# Patient Record
Sex: Female | Born: 1985 | Race: Black or African American | Hispanic: No | Smoking: Former smoker
Health system: Southern US, Community
[De-identification: ages and names within clinical notes are randomized; demographics above are authoritative.]

## PROBLEM LIST (undated history)

## (undated) ENCOUNTER — Inpatient Hospital Stay (HOSPITAL_COMMUNITY): Payer: Self-pay

## (undated) ENCOUNTER — Emergency Department (HOSPITAL_COMMUNITY): Admission: EM | Payer: Medicaid Other | Source: Home / Self Care

## (undated) ENCOUNTER — Emergency Department (HOSPITAL_COMMUNITY): Admission: EM | Payer: Self-pay

## (undated) DIAGNOSIS — O24419 Gestational diabetes mellitus in pregnancy, unspecified control: Secondary | ICD-10-CM

## (undated) DIAGNOSIS — G43909 Migraine, unspecified, not intractable, without status migrainosus: Secondary | ICD-10-CM

## (undated) DIAGNOSIS — R87619 Unspecified abnormal cytological findings in specimens from cervix uteri: Secondary | ICD-10-CM

## (undated) DIAGNOSIS — N946 Dysmenorrhea, unspecified: Secondary | ICD-10-CM

## (undated) DIAGNOSIS — T7840XA Allergy, unspecified, initial encounter: Secondary | ICD-10-CM

## (undated) DIAGNOSIS — Z973 Presence of spectacles and contact lenses: Secondary | ICD-10-CM

## (undated) DIAGNOSIS — F419 Anxiety disorder, unspecified: Secondary | ICD-10-CM

## (undated) DIAGNOSIS — IMO0002 Reserved for concepts with insufficient information to code with codable children: Secondary | ICD-10-CM

## (undated) DIAGNOSIS — F32A Depression, unspecified: Secondary | ICD-10-CM

## (undated) DIAGNOSIS — R110 Nausea: Secondary | ICD-10-CM

## (undated) DIAGNOSIS — I10 Essential (primary) hypertension: Secondary | ICD-10-CM

## (undated) DIAGNOSIS — D649 Anemia, unspecified: Secondary | ICD-10-CM

## (undated) DIAGNOSIS — N739 Female pelvic inflammatory disease, unspecified: Secondary | ICD-10-CM

## (undated) DIAGNOSIS — F329 Major depressive disorder, single episode, unspecified: Secondary | ICD-10-CM

## (undated) HISTORY — DX: Presence of spectacles and contact lenses: Z97.3

## (undated) HISTORY — PX: OTHER SURGICAL HISTORY: SHX169

## (undated) HISTORY — DX: Unspecified abnormal cytological findings in specimens from cervix uteri: R87.619

## (undated) HISTORY — DX: Nausea: R11.0

## (undated) HISTORY — DX: Dysmenorrhea, unspecified: N94.6

## (undated) HISTORY — DX: Gestational diabetes mellitus in pregnancy, unspecified control: O24.419

## (undated) HISTORY — DX: Anemia, unspecified: D64.9

## (undated) HISTORY — DX: Female pelvic inflammatory disease, unspecified: N73.9

## (undated) HISTORY — DX: Migraine, unspecified, not intractable, without status migrainosus: G43.909

## (undated) HISTORY — DX: Anxiety disorder, unspecified: F41.9

## (undated) HISTORY — DX: Reserved for concepts with insufficient information to code with codable children: IMO0002

## (undated) HISTORY — DX: Allergy, unspecified, initial encounter: T78.40XA

## (undated) HISTORY — DX: Depression, unspecified: F32.A

## (undated) HISTORY — DX: Major depressive disorder, single episode, unspecified: F32.9

## (undated) HISTORY — PX: WISDOM TOOTH EXTRACTION: SHX21

---

## 2009-04-23 ENCOUNTER — Emergency Department: Payer: Self-pay | Admitting: Emergency Medicine

## 2009-11-13 ENCOUNTER — Emergency Department: Payer: Self-pay | Admitting: Emergency Medicine

## 2009-11-18 ENCOUNTER — Emergency Department: Payer: Self-pay | Admitting: Emergency Medicine

## 2010-03-11 ENCOUNTER — Emergency Department: Payer: Self-pay | Admitting: Emergency Medicine

## 2010-05-27 ENCOUNTER — Emergency Department: Payer: Self-pay | Admitting: Emergency Medicine

## 2010-05-29 ENCOUNTER — Emergency Department: Payer: Self-pay | Admitting: Emergency Medicine

## 2010-06-02 ENCOUNTER — Emergency Department: Payer: Self-pay | Admitting: Emergency Medicine

## 2010-06-03 ENCOUNTER — Emergency Department: Payer: Self-pay | Admitting: Emergency Medicine

## 2010-10-11 ENCOUNTER — Emergency Department (HOSPITAL_COMMUNITY)
Admission: EM | Admit: 2010-10-11 | Discharge: 2010-10-11 | Payer: Self-pay | Source: Home / Self Care | Admitting: Emergency Medicine

## 2011-01-01 ENCOUNTER — Emergency Department (HOSPITAL_COMMUNITY): Payer: Medicaid Other

## 2011-01-01 ENCOUNTER — Emergency Department (HOSPITAL_COMMUNITY)
Admission: EM | Admit: 2011-01-01 | Discharge: 2011-01-01 | Disposition: A | Discharge status: Home / Self Care | Payer: Medicaid Other | Attending: Emergency Medicine | Admitting: Emergency Medicine

## 2011-01-01 DIAGNOSIS — R059 Cough, unspecified: Secondary | ICD-10-CM | POA: Insufficient documentation

## 2011-01-01 DIAGNOSIS — R05 Cough: Secondary | ICD-10-CM | POA: Insufficient documentation

## 2011-01-01 DIAGNOSIS — R0789 Other chest pain: Secondary | ICD-10-CM | POA: Insufficient documentation

## 2011-01-01 DIAGNOSIS — F3289 Other specified depressive episodes: Secondary | ICD-10-CM | POA: Insufficient documentation

## 2011-01-01 DIAGNOSIS — Z79899 Other long term (current) drug therapy: Secondary | ICD-10-CM | POA: Insufficient documentation

## 2011-01-01 DIAGNOSIS — J45909 Unspecified asthma, uncomplicated: Secondary | ICD-10-CM | POA: Insufficient documentation

## 2011-01-01 DIAGNOSIS — F329 Major depressive disorder, single episode, unspecified: Secondary | ICD-10-CM | POA: Insufficient documentation

## 2011-01-01 DIAGNOSIS — R0602 Shortness of breath: Secondary | ICD-10-CM | POA: Insufficient documentation

## 2011-07-21 ENCOUNTER — Emergency Department (HOSPITAL_COMMUNITY)
Admission: EM | Admit: 2011-07-21 | Discharge: 2011-07-21 | Disposition: A | Discharge status: Home / Self Care | Payer: Medicaid Other | Attending: Emergency Medicine | Admitting: Emergency Medicine

## 2011-07-21 DIAGNOSIS — F3289 Other specified depressive episodes: Secondary | ICD-10-CM | POA: Insufficient documentation

## 2011-07-21 DIAGNOSIS — N949 Unspecified condition associated with female genital organs and menstrual cycle: Secondary | ICD-10-CM | POA: Insufficient documentation

## 2011-07-21 DIAGNOSIS — N94819 Vulvodynia, unspecified: Secondary | ICD-10-CM | POA: Insufficient documentation

## 2011-07-21 DIAGNOSIS — F329 Major depressive disorder, single episode, unspecified: Secondary | ICD-10-CM | POA: Insufficient documentation

## 2011-07-21 DIAGNOSIS — N898 Other specified noninflammatory disorders of vagina: Secondary | ICD-10-CM | POA: Insufficient documentation

## 2011-07-21 LAB — URINE MICROSCOPIC-ADD ON

## 2011-07-21 LAB — URINALYSIS, ROUTINE W REFLEX MICROSCOPIC
Glucose, UA: NEGATIVE mg/dL
Ketones, ur: 40 mg/dL — AB
Leukocytes, UA: NEGATIVE
Nitrite: NEGATIVE
Protein, ur: NEGATIVE mg/dL
Specific Gravity, Urine: 1.028 (ref 1.005–1.030)
Urobilinogen, UA: 1 mg/dL (ref 0.0–1.0)
pH: 5.5 (ref 5.0–8.0)

## 2011-07-21 LAB — WET PREP, GENITAL
Trich, Wet Prep: NONE SEEN
Yeast Wet Prep HPF POC: NONE SEEN

## 2011-07-21 LAB — POCT PREGNANCY, URINE: Preg Test, Ur: NEGATIVE

## 2011-07-22 LAB — GC/CHLAMYDIA PROBE AMP, GENITAL
Chlamydia, DNA Probe: NEGATIVE
GC Probe Amp, Genital: NEGATIVE

## 2011-08-22 ENCOUNTER — Emergency Department (HOSPITAL_COMMUNITY)
Admission: EM | Admit: 2011-08-22 | Discharge: 2011-08-22 | Disposition: A | Discharge status: Home / Self Care | Payer: Self-pay | Attending: Emergency Medicine | Admitting: Emergency Medicine

## 2011-08-22 ENCOUNTER — Encounter: Payer: Self-pay | Admitting: Emergency Medicine

## 2011-08-22 DIAGNOSIS — L0291 Cutaneous abscess, unspecified: Secondary | ICD-10-CM

## 2011-08-22 DIAGNOSIS — IMO0002 Reserved for concepts with insufficient information to code with codable children: Secondary | ICD-10-CM | POA: Insufficient documentation

## 2011-08-22 DIAGNOSIS — J45909 Unspecified asthma, uncomplicated: Secondary | ICD-10-CM | POA: Insufficient documentation

## 2011-08-22 MED ORDER — DOXYCYCLINE HYCLATE 50 MG PO CAPS: 50.0000 mg | ORAL_CAPSULE | Freq: Two times a day (BID) | ORAL | Status: AC

## 2011-08-22 NOTE — ED Provider Notes (Signed)
Medical screening examination/treatment/procedure(s) were performed by non-physician practitioner and as supervising physician I was immediately available for consultation/collaboration.   Gwyneth Sprout, MD 08/22/11 2139

## 2011-08-22 NOTE — ED Provider Notes (Signed)
History     CSN: [id number] Arrival date & time: 08/22/2011  5:43 PM   None     Chief Complaint  Patient presents with  . Recurrent Skin Infections    (Consider location/radiation/quality/duration/timing/severity/associated sxs/prior treatment) The history is provided by the patient.    Pt presents to the ED with complaints of recurrent abscesses. She had an abscess drained by Dermatology a couple of weeks ago, she thought it had healed up but it became a white head then opened when she was in the shower and has been draining since yesterday. Pt has been feeling like she has a fever, however, she does not have one in the ED.   Past Medical History  Diagnosis Date  . Asthma     Past Surgical History  Procedure Date  . Cesarean section     History reviewed. No pertinent family history.  History  Substance Use Topics  . Smoking status: Never Smoker   . Smokeless tobacco: Never Used  . Alcohol Use: No    OB History    Grav Para Term Preterm Abortions TAB SAB Ect Mult Living                  Review of Systems  All other systems reviewed and are negative.    Allergies  Review of patient's allergies indicates no known allergies.  Home Medications   Current Outpatient Rx  Name Route Sig Dispense Refill  . OVER THE COUNTER MEDICATION Oral Take 2 tablets by mouth. Took an over the counter cold medicine, unknown name .       BP 111/69  Pulse 107  Temp(Src) 98.6 F (37 C) (Oral)  Resp 20  SpO2 99%  LMP 08/15/2011  Physical Exam  Constitutional: She appears well-developed and well-nourished.  HENT:  Head: Normocephalic and atraumatic.  Eyes: Conjunctivae are normal. Pupils are equal, round, and reactive to light.  Neck: Trachea normal, normal range of motion and full passive range of motion without pain. Neck supple.  Cardiovascular: Normal rate, regular rhythm and normal pulses.   Pulmonary/Chest: Effort normal and breath sounds normal. Chest wall is not  dull to percussion. She exhibits no tenderness, no crepitus, no edema, no deformity and no retraction.  Abdominal: Soft. Normal appearance and bowel sounds are normal.  Musculoskeletal: Normal range of motion.  Neurological: She is alert. She has normal strength.  Skin: Skin is warm, dry and intact.     Psychiatric: She has a normal mood and affect. Her speech is normal and behavior is normal. Judgment and thought content normal. Cognition and memory are normal.    ED Course  Procedures (including critical care time)  Labs Reviewed - No data to display No results found.   No diagnosis found.    MDM  Pt is developing some mild surrounding cellulitis to open area. Last finished abx on November 11. Will give another short course of doxy and have patient follow-up with her dermatologist.         Dorthula Matas, PA 08/22/11 [phone]

## 2011-08-22 NOTE — ED Notes (Signed)
Pt reports flu like symptoms onset yesterday. Pt also reports boil under right arm draining.

## 2012-02-06 ENCOUNTER — Encounter (HOSPITAL_COMMUNITY): Payer: Self-pay | Admitting: Emergency Medicine

## 2012-02-06 ENCOUNTER — Emergency Department (HOSPITAL_COMMUNITY)
Admission: EM | Admit: 2012-02-06 | Discharge: 2012-02-06 | Disposition: A | Discharge status: Home / Self Care | Payer: Self-pay | Attending: Emergency Medicine | Admitting: Emergency Medicine

## 2012-02-06 DIAGNOSIS — R059 Cough, unspecified: Secondary | ICD-10-CM | POA: Insufficient documentation

## 2012-02-06 DIAGNOSIS — J069 Acute upper respiratory infection, unspecified: Secondary | ICD-10-CM

## 2012-02-06 DIAGNOSIS — R079 Chest pain, unspecified: Secondary | ICD-10-CM | POA: Insufficient documentation

## 2012-02-06 DIAGNOSIS — J45909 Unspecified asthma, uncomplicated: Secondary | ICD-10-CM | POA: Insufficient documentation

## 2012-02-06 DIAGNOSIS — R0602 Shortness of breath: Secondary | ICD-10-CM | POA: Insufficient documentation

## 2012-02-06 DIAGNOSIS — R05 Cough: Secondary | ICD-10-CM | POA: Insufficient documentation

## 2012-02-06 MED ORDER — LORATADINE 10 MG PO TABS
10.0000 mg | ORAL_TABLET | Freq: Every day | ORAL | Status: DC
  Administered 2012-02-06: 10 mg via ORAL
  Filled 2012-02-06: qty 1

## 2012-02-06 MED ORDER — LORATADINE 10 MG PO TABS: 10.0000 mg | ORAL_TABLET | Freq: Every day | ORAL | Status: DC

## 2012-02-06 MED ORDER — PSEUDOEPHEDRINE HCL ER 120 MG PO TB12: 120.0000 mg | ORAL_TABLET | Freq: Two times a day (BID) | ORAL | Status: DC

## 2012-02-06 NOTE — ED Provider Notes (Signed)
History    This chart was scribed for Gerhard Munch, MD, MD by Smitty Pluck. The patient was seen in room STRE3 and the patient's care was started at 2:20PM.   CSN: [id number]  Arrival date & time 02/06/12  1346   First MD Initiated Contact with Patient 02/06/12 1413      Chief Complaint  Patient presents with  . Chest Pain  . Shortness of Breath  . Cough  . Nasal Congestion    (Consider location/radiation/quality/duration/timing/severity/associated sxs/prior treatment) Patient is a 26 y.o. female presenting with chest pain, shortness of breath, and cough. The history is provided by the patient.  Chest Pain Primary symptoms include shortness of breath and cough. Pertinent negatives for primary symptoms include no vomiting.    Shortness of Breath  Associated symptoms include chest pain, cough and shortness of breath.  Cough Associated symptoms include chest pain and shortness of breath.   Vanessa Foster is a 26 y.o. female who presents to the Emergency Department complaining of moderate chest pain 1 day ago and congestion onset 2 weeks ago. Pt reports that she was awaken by pain. She denies any other health complications. She has hx of asthma and has been hospitalized for it in 2001. Pt reports chills. Denies fever, vomiting and diarrhea. Pt uses birth control. Symptoms have been constant since onset without radiation.   Past Medical History  Diagnosis Date  . Asthma     Past Surgical History  Procedure Date  . Cesarean section     History reviewed. No pertinent family history.  History  Substance Use Topics  . Smoking status: Never Smoker   . Smokeless tobacco: Never Used  . Alcohol Use: No    OB History    Grav Para Term Preterm Abortions TAB SAB Ect Mult Living                  Review of Systems  Constitutional:       HPI  HENT:       HPI otherwise negative  Eyes: Negative.   Respiratory: Positive for cough and shortness of breath.        HPI,  otherwise negative  Cardiovascular: Positive for chest pain.       HPI, otherwise nmegative  Gastrointestinal: Negative for vomiting.  Genitourinary:       HPI, otherwise negative  Musculoskeletal:       HPI, otherwise negative  Skin: Negative.   Neurological: Negative for syncope.    Allergies  Review of patient's allergies indicates no known allergies.  Home Medications  No current outpatient prescriptions on file.  BP 116/82  Pulse 89  Resp 20  SpO2 100%  Physical Exam  Nursing note and vitals reviewed. Constitutional: She is oriented to person, place, and time. She appears well-developed and well-nourished. No distress.  HENT:  Head: Normocephalic and atraumatic.  Mouth/Throat: Oropharynx is clear and moist.  Eyes: EOM are normal.  Neck: Neck supple. No tracheal deviation present.  Cardiovascular: Normal rate.   Pulmonary/Chest: Effort normal. No respiratory distress.  Musculoskeletal: Normal range of motion.  Lymphadenopathy:    She has no cervical adenopathy.  Neurological: She is alert and oriented to person, place, and time.  Skin: Skin is warm and dry.  Psychiatric: She has a normal mood and affect. Her behavior is normal.    ED Course  Procedures (including critical care time) DIAGNOSTIC STUDIES: Oxygen Saturation is 100% on room air, normal by my interpretation.    COORDINATION OF  CARE: 2:25PM EDP discusses pt ED treatment with pt.    Labs Reviewed - No data to display No results found.   No diagnosis found.   Date: 02/06/2012  Rate: 89  Rhythm: normal sinus rhythm and sinus arrhythmia  QRS Axis: normal  Intervals: normal  ST/T Wave abnormalities: normal  Conduction Disutrbances:none  Narrative Interpretation:   Old EKG Reviewed: none available BORDERLINE ECG   MDM  I personally performed the services described in this documentation, which was scribed in my presence. The recorded information has been reviewed and considered.   This  generally well female presents with cough and congestion for several weeks and new chest pain.  On exam the patient is in no distress with no audible wheezing.  Given the patient's description of a history of asthma, as well as her rhinitis her symptoms are likely due to a multifactorial process, with concern for URI versus seasonal allergies versus asthma.  The patient remained no distress throughout the emergency department evaluation.  She was not hypoxic, tachypneic or tachycardic.  The patient received medication as well as counseling appropriate therapy and was discharged in stable condition.   Gerhard Munch, MD 02/06/12 [phone]

## 2012-02-06 NOTE — ED Notes (Signed)
Pt reports cough and nasal congestion x 2 weeks, chest pain with shortness of breath onset yesterday.

## 2012-06-15 ENCOUNTER — Encounter (HOSPITAL_COMMUNITY): Payer: Self-pay

## 2012-06-15 ENCOUNTER — Emergency Department (INDEPENDENT_AMBULATORY_CARE_PROVIDER_SITE_OTHER)
Admission: EM | Admit: 2012-06-15 | Discharge: 2012-06-15 | Disposition: A | Discharge status: Home / Self Care | Payer: Self-pay | Source: Home / Self Care

## 2012-06-15 DIAGNOSIS — L02419 Cutaneous abscess of limb, unspecified: Secondary | ICD-10-CM

## 2012-06-15 DIAGNOSIS — IMO0002 Reserved for concepts with insufficient information to code with codable children: Secondary | ICD-10-CM

## 2012-06-15 MED ORDER — CEPHALEXIN 500 MG PO CAPS: 500.0000 mg | ORAL_CAPSULE | Freq: Three times a day (TID) | ORAL | Status: DC

## 2012-06-15 NOTE — ED Provider Notes (Signed)
History     CSN: [id number]  Arrival date & time 06/15/12  1445   None     Chief Complaint  Patient presents with  . Sore    (Consider location/radiation/quality/duration/timing/severity/associated sxs/prior treatment) HPI  CC: Abscess  Abscess: boil started 4 weeks ago under R armpit. Popped 3 weeks ago. Has become progressively more painful w/ bloddy purulent discharge. Tried triple antibiotic ointment at hoe w/o relief. Has had this before and was seen by dermatologist for questionable hydradenitis. Reports elevated temp to 100.2, chills, ha and occasional nausea. Denies any rash, emesis, diarrhea, loc. Motrin w/o benefit.    Past Medical History  Diagnosis Date  . Asthma     Past Surgical History  Procedure Date  . Cesarean section     No family history on file.  History  Substance Use Topics  . Smoking status: Never Smoker   . Smokeless tobacco: Never Used  . Alcohol Use: No    OB History    Grav Para Term Preterm Abortions TAB SAB Ect Mult Living                  Review of Systems Per hpi Allergies  Review of patient's allergies indicates no known allergies.  Home Medications   Current Outpatient Rx  Name Route Sig Dispense Refill  . CEPHALEXIN 500 MG PO CAPS Oral Take 1 capsule (500 mg total) by mouth 3 (three) times daily. 21 capsule 0  . LORATADINE 10 MG PO TABS Oral Take 1 tablet (10 mg total) by mouth daily. 10 tablet 0  . PSEUDOEPHEDRINE HCL ER 120 MG PO TB12 Oral Take 1 tablet (120 mg total) by mouth every 12 (twelve) hours. 10 tablet 0    BP 121/86  Pulse 77  Temp 98.2 F (36.8 C) (Oral)  Resp 16  SpO2 100%  Physical Exam  Gen: WNWD, NAD SKin: L axillary boil w/ area of induration about 1x2cm. Erythema Res: normal effort  ED Course  INCISION AND DRAINAGE Date/Time: 06/15/2012 4:36 PM Performed by: Konrad Dolores, DAVID J Authorized by: Jimmie Molly Consent: Verbal consent obtained. Risks and benefits: risks, benefits and  alternatives were discussed Consent given by: patient Patient understanding: patient states understanding of the procedure being performed Patient consent: the patient's understanding of the procedure matches consent given Relevant documents: relevant documents present and verified Test results: test results available and properly labeled Site marked: the operative site was marked Patient identity confirmed: verbally with patient Time out: Immediately prior to procedure a "time out" was called to verify the correct patient, procedure, equipment, support staff and site/side marked as required. Type: abscess Body area: trunk Location details: chest Local anesthetic: lidocaine 2% without epinephrine Anesthetic total: 2 ml Scalpel size: 11 Needle gauge: 30. Incision type: single straight Complexity: simple Drainage: purulent Drainage amount: scant Wound treatment: wound left open Packing material: 1/4 in gauze Patient tolerance: Patient tolerated the procedure well with no immediate complications.   (including critical care time)  Labs Reviewed - No data to display No results found.   1. Axillary abscess       MDM  26yo AAF w/ recurrent axillary infections w/ abscess. S/p I&D and ready for DC - Keflex 7 days - pt w/ careful wound care instructions. To remove gauze in 24 hrs - f/u w/ pcp/derm        Ozella Rocks, MD 06/15/12 1640

## 2012-06-15 NOTE — ED Notes (Signed)
Patient states she has an abscess under her right arm, says that it developed over 3 weeks ago

## 2012-06-15 NOTE — ED Provider Notes (Signed)
Medical screening examination/treatment/procedure(s) were performed by non-physician practitioner and as supervising physician I was immediately available for consultation/collaboration.  Raynald Blend, MD 06/15/12 [phone]

## 2012-06-19 LAB — CULTURE, ROUTINE-ABSCESS: Culture: NO GROWTH

## 2012-07-03 ENCOUNTER — Emergency Department (HOSPITAL_COMMUNITY)
Admission: EM | Admit: 2012-07-03 | Discharge: 2012-07-03 | Disposition: A | Discharge status: Home / Self Care | Payer: Self-pay | Attending: Emergency Medicine | Admitting: Emergency Medicine

## 2012-07-03 ENCOUNTER — Encounter (HOSPITAL_COMMUNITY): Payer: Self-pay | Admitting: *Deleted

## 2012-07-03 DIAGNOSIS — IMO0002 Reserved for concepts with insufficient information to code with codable children: Secondary | ICD-10-CM | POA: Insufficient documentation

## 2012-07-03 DIAGNOSIS — L0291 Cutaneous abscess, unspecified: Secondary | ICD-10-CM

## 2012-07-03 DIAGNOSIS — J45909 Unspecified asthma, uncomplicated: Secondary | ICD-10-CM | POA: Insufficient documentation

## 2012-07-03 MED ORDER — ONDANSETRON HCL 4 MG PO TABS: 4.0000 mg | ORAL_TABLET | Freq: Four times a day (QID) | ORAL | Status: DC

## 2012-07-03 MED ORDER — CEPHALEXIN 500 MG PO CAPS: 500.0000 mg | ORAL_CAPSULE | Freq: Four times a day (QID) | ORAL | Status: DC

## 2012-07-03 MED ORDER — CLOTRIMAZOLE 1 % EX CREA: TOPICAL_CREAM | CUTANEOUS | Status: DC

## 2012-07-03 NOTE — ED Provider Notes (Signed)
History     CSN: [id number]  Arrival date & time 07/03/12  1543   First MD Initiated Contact with Patient 07/03/12 1613      Chief Complaint  Patient presents with  . Abscess    left arm    (Consider location/radiation/quality/duration/timing/severity/associated sxs/prior treatment) HPI Comments: This 26 year old female, who presents to the emergency department with chief complaint of abscess under her left axilla. Additionally she states that she is concerned about getting a stomach bug and feels nauseated. She also complains of a new rash on her breasts. She has not tried anything to alleviate her symptoms. She is in moderate discomfort. Her pain does not radiate.  The history is provided by the patient. No language interpreter was used.    Past Medical History  Diagnosis Date  . Asthma     Past Surgical History  Procedure Date  . Cesarean section     No family history on file.  History  Substance Use Topics  . Smoking status: Never Smoker   . Smokeless tobacco: Never Used  . Alcohol Use: No    OB History    Grav Para Term Preterm Abortions TAB SAB Ect Mult Living                  Review of Systems  Constitutional: Negative for fever.  HENT: Negative for rhinorrhea.   Respiratory: Negative for cough, chest tightness and shortness of breath.   Cardiovascular: Negative for chest pain and palpitations.  Gastrointestinal: Positive for nausea. Negative for abdominal pain.  Genitourinary: Negative for dysuria.  Musculoskeletal: Negative for back pain.  Skin:       Rash in between breasts  Neurological: Positive for headaches. Negative for weakness.  Psychiatric/Behavioral: The patient is not nervous/anxious.   All other systems reviewed and are negative.    Allergies  Review of patient's allergies indicates no known allergies.  Home Medications   Current Outpatient Rx  Name Route Sig Dispense Refill  . ACETAMINOPHEN 500 MG PO TABS Oral Take 1,000 mg  by mouth every 6 (six) hours as needed. For pain    . CEPHALEXIN 500 MG PO CAPS Oral Take 1 capsule (500 mg total) by mouth 4 (four) times daily. 40 capsule 0  . ONDANSETRON HCL 4 MG PO TABS Oral Take 1 tablet (4 mg total) by mouth every 6 (six) hours. 12 tablet 0    BP 137/100  Pulse 98  Temp 98.4 F (36.9 C) (Oral)  Resp 18  SpO2 99%  Physical Exam  Nursing note and vitals reviewed. Constitutional: She is oriented to person, place, and time. She appears well-developed and well-nourished.  HENT:  Head: Normocephalic and atraumatic.  Eyes: Conjunctivae normal and EOM are normal. Pupils are equal, round, and reactive to light.  Neck: Normal range of motion. Neck supple.  Cardiovascular: Normal rate, regular rhythm and normal heart sounds.   Pulmonary/Chest: Effort normal and breath sounds normal.  Abdominal: Soft. Bowel sounds are normal.  Musculoskeletal: Normal range of motion.  Neurological: She is alert and oriented to person, place, and time.  Skin: Skin is warm. Rash noted.       Small 1 cm abscess under left axilla, 2 cm diameter rash on medial right breast, appears fungal.  Psychiatric: She has a normal mood and affect. Her behavior is normal. Judgment and thought content normal.    ED Course  Procedures (including critical care time)  Labs Reviewed - No data to display No results found.  INCISION AND DRAINAGE Performed by: Roxy Horseman Consent: Verbal consent obtained. Risks and benefits: risks, benefits and alternatives were discussed Type: abscess  Body area: left axilla  Anesthesia: local infiltration  Local anesthetic: lidocaine 2% without epinephrine  Anesthetic total: 2 ml  Complexity: complex Blunt dissection to break up loculations  Drainage: purulent  Drainage amount: minimal  Packing material: 1/4 in iodoform gauze  Patient tolerance: Patient tolerated the procedure well with no immediate complications.   1. Abscess       MDM    26 year old female with left axillary abscess. I attempted to I&D the abscess, but was unsuccessful at getting any drainage out. Will give the patient Keflex for one week.  Patient also has what appears to be fungal infection on her breast. Will prescribe clotrimazole cream and instructed patient to follow up with her primary care provider.  Patient also states that she is feeling like she is getting sick. She states that she is nauseated and has a headache. I will prescribe Zofran and recommend OTC pain medicine for her symptoms.  The patient is stable and ready for discharge. Return precautions have been given.       Roxy Horseman, PA-C 07/03/12 1728

## 2012-07-03 NOTE — ED Notes (Signed)
Pt stated that she thinks that the spot on her right breast looks like something a pt that she has at her work has. The pt at her work is on isolation and she thinks they are on isolation for shingles.

## 2012-07-03 NOTE — ED Notes (Signed)
Abscess under left arm and she has a dark spot to right breast area.  Pt reports headache when she got off of work this am.  Pt does not get migraines.  Pt has left posterior headache.  Pt reports nausea.  Not sensitive to light or sound.  PT crying at triage

## 2012-07-04 NOTE — ED Provider Notes (Signed)
Medical screening examination/treatment/procedure(s) were performed by non-physician practitioner and as supervising physician I was immediately available for consultation/collaboration. Zimal Weisensel, MD, FACEP   Arelia Volpe L Emmery Seiler, MD 07/04/12 0025 

## 2013-01-15 ENCOUNTER — Emergency Department (HOSPITAL_COMMUNITY)
Admission: EM | Admit: 2013-01-15 | Discharge: 2013-01-15 | Disposition: A | Discharge status: Home / Self Care | Payer: Managed Care, Other (non HMO) | Attending: Emergency Medicine | Admitting: Emergency Medicine

## 2013-01-15 ENCOUNTER — Encounter (HOSPITAL_COMMUNITY): Payer: Self-pay | Admitting: Emergency Medicine

## 2013-01-15 DIAGNOSIS — J45909 Unspecified asthma, uncomplicated: Secondary | ICD-10-CM | POA: Insufficient documentation

## 2013-01-15 DIAGNOSIS — IMO0002 Reserved for concepts with insufficient information to code with codable children: Secondary | ICD-10-CM | POA: Insufficient documentation

## 2013-01-15 DIAGNOSIS — M5416 Radiculopathy, lumbar region: Secondary | ICD-10-CM

## 2013-01-15 MED ORDER — PREDNISONE 50 MG PO TABS: 50.0000 mg | ORAL_TABLET | Freq: Every day | ORAL | Status: DC

## 2013-01-15 MED ORDER — HYDROCODONE-ACETAMINOPHEN 5-325 MG PO TABS: 1.0000 | ORAL_TABLET | Freq: Four times a day (QID) | ORAL | Status: DC | PRN

## 2013-01-15 NOTE — ED Provider Notes (Signed)
History     CSN: [id number]  Arrival date & time 01/15/13  1025   First MD Initiated Contact with Patient 01/15/13 1057      Chief Complaint  Patient presents with  . Leg Pain    (Consider location/radiation/quality/duration/timing/severity/associated sxs/prior treatment) Patient is a 27 y.o. female presenting with leg pain.  Leg Pain  Patient is 27 y.o female who presents with left sided leg pain.  Patient states pain began yesterday after standing and then sitting for an extended period of time.  She denies any other precipitating event or injury.  Pain is described as a shooting/throbbing sharp pain that begins at her lower back and goes down her left leg.  Pain is made worse by bending over and standing.  She has never experienced this before.  She has not taken any medication for the pain.  She denies neck, knee, and ankle pain.    Past Medical History  Diagnosis Date  . Asthma     Past Surgical History  Procedure Laterality Date  . Cesarean section      No family history on file.  History  Substance Use Topics  . Smoking status: Never Smoker   . Smokeless tobacco: Never Used  . Alcohol Use: No    OB History   Grav Para Term Preterm Abortions TAB SAB Ect Mult Living                  Review of Systems All other systems negative except as documented in the HPI. All pertinent positives and negatives as reviewed in the HPI.   Allergies  Review of patient's allergies indicates no known allergies.  Home Medications  No current outpatient prescriptions on file.  BP 108/77  Pulse 76  Temp(Src) 98.1 F (36.7 C) (Oral)  Resp 16  SpO2 98%  Physical Exam  Nursing note and vitals reviewed. Constitutional: She is oriented to person, place, and time. She appears well-developed and well-nourished.  HENT:  Head: Normocephalic and atraumatic.  Neck: Normal range of motion. Neck supple.  Cardiovascular: Normal rate and regular rhythm.   Pulmonary/Chest: Effort  normal and breath sounds normal.  Musculoskeletal: Normal range of motion.       Left hip: She exhibits normal range of motion, normal strength and no tenderness.       Left knee: Normal.       Cervical back: She exhibits normal range of motion, no tenderness and no bony tenderness.  Lumbar Back: questionable left-sided straight leg test.  Tenderness over left sciatic notch.  Neurological: She is alert and oriented to person, place, and time. She exhibits normal muscle tone. Coordination normal.  Skin: Skin is warm and dry. No rash noted.    ED Course  Procedures (including critical care time)  Return here as needed. 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 The patient states that she did not fall or injury herself that she knows of. Told to ice and heat to her back. Patient has normal reflexes and strength in her lower extremities.  MDM          Carlyle Dolly, PA-C 01/15/13 1203

## 2013-01-15 NOTE — ED Provider Notes (Signed)
Medical screening examination/treatment/procedure(s) were performed by non-physician practitioner and as supervising physician I was immediately available for consultation/collaboration.  Isabella Roemmich R. Gali Spinney, MD 01/17/13 0019 

## 2013-01-15 NOTE — ED Notes (Signed)
Pt. Stated, ive had both legs to hurt since yesterday.  No injury

## 2013-04-25 ENCOUNTER — Emergency Department (HOSPITAL_COMMUNITY)
Admission: EM | Admit: 2013-04-25 | Discharge: 2013-04-25 | Disposition: A | Discharge status: Home / Self Care | Payer: Managed Care, Other (non HMO) | Attending: Emergency Medicine | Admitting: Emergency Medicine

## 2013-04-25 ENCOUNTER — Encounter (HOSPITAL_COMMUNITY): Payer: Self-pay | Admitting: Emergency Medicine

## 2013-04-25 DIAGNOSIS — IMO0002 Reserved for concepts with insufficient information to code with codable children: Secondary | ICD-10-CM | POA: Insufficient documentation

## 2013-04-25 DIAGNOSIS — L0291 Cutaneous abscess, unspecified: Secondary | ICD-10-CM

## 2013-04-25 DIAGNOSIS — J45909 Unspecified asthma, uncomplicated: Secondary | ICD-10-CM | POA: Insufficient documentation

## 2013-04-25 NOTE — ED Notes (Signed)
Pt c/o bilateral axillary abscesses. Hx of same. Right axilla has pink tissue protruding from wound. Pt states she gets them every two weeks.

## 2013-04-25 NOTE — ED Notes (Signed)
PA at bedside for evaluation

## 2013-04-25 NOTE — ED Provider Notes (Signed)
CSN: [id number]     Arrival date & time 04/25/13  1307 History     First MD Initiated Contact with Patient 04/25/13 1347     Chief Complaint  Patient presents with  . Abscess   (Consider location/radiation/quality/duration/timing/severity/associated sxs/prior Treatment) Patient is a 27 y.o. female presenting with abscess. The history is provided by the patient. No language interpreter was used.  Abscess Associated symptoms: no fever and no headaches   Vanessa Foster is a 27 y/o F with PMHx of asthma, presenting to the ED with abscess to bilateral axillas. Patient reported that the abscess formed approximately 10 days ago, reported that the right axilla is causing more discomfort than the left - stated that the left abscess has been draining and feels a lot better. Patient reported that she has noticed draining to the right axilla, reported that there was grayish discharge on the gauze when it was removed today. Patient reported that she has been experiencing a pinching sensation to the right axilla that is constant - reported that pain radiates down the left arm. 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 Reported that nothing makes the pain better. Stated that she has been using warm soaks, warm compressions, triple antibiotics. Denied fever, chest pain, shortness of breath, difficulty breathing, neck pain, neck stiffness, numbness, tingling.  PCP Dr. Dareen Piano  Patient reported that she has been having issues with abscesses formation for the past couple of years, since 2010-2011 - patient reported that she gets abscesses all the time mainly to the axilla region.   Past Medical History  Diagnosis Date  . Asthma    Past Surgical History  Procedure Laterality Date  . Cesarean section     No family history on file. History  Substance Use Topics  . Smoking status: Never Smoker   . Smokeless tobacco: Never Used  . Alcohol Use: No   OB History   Grav Para  Term Preterm Abortions TAB SAB Ect Mult Living                 Review of Systems  Constitutional: Negative for fever and chills.  HENT: Negative for neck pain and neck stiffness.   Respiratory: Negative for cough and shortness of breath.   Cardiovascular: Negative for chest pain.  Musculoskeletal: Negative for back pain and arthralgias.  Skin: Positive for wound (abscess).  Neurological: Negative for dizziness, weakness and headaches.  All other systems reviewed and are negative.    Allergies  Review of patient's allergies indicates no known allergies.  Home Medications   Current Outpatient Rx  Name  Route  Sig  Dispense  Refill  . ibuprofen (ADVIL,MOTRIN) 200 MG tablet   Oral   Take 400 mg by mouth every 6 (six) hours as needed for pain or headache.         . neomycin-bacitracin-polymyxin (NEOSPORIN) ointment   Topical   Apply 1 application topically every 12 (twelve) hours. Apply to boil under arm          BP 121/85  Pulse 78  Temp(Src) 98.2 F (36.8 C) (Oral)  SpO2 100%  LMP 04/11/2013 Physical Exam  Nursing note and vitals reviewed. Constitutional: She is oriented to person, place, and time. She appears well-developed and well-nourished. No distress.  HENT:  Head: Normocephalic and atraumatic.  Eyes: Conjunctivae and EOM are normal. Pupils are equal, round, and reactive to light. Right eye exhibits no discharge. Left eye exhibits no discharge.  Neck: Normal  range of motion. Neck supple.  Cardiovascular: Normal rate, regular rhythm and normal heart sounds.  Exam reveals no friction rub.   No murmur heard. Pulses:      Radial pulses are 2+ on the right side, and 2+ on the left side.  Pulmonary/Chest: Effort normal and breath sounds normal. No respiratory distress. She has no wheezes. She has no rales.  Musculoskeletal: Normal range of motion.  Lymphadenopathy:    She has no cervical adenopathy.  Neurological: She is alert and oriented to person, place, and  time. She exhibits normal muscle tone. Coordination normal.  Strength 5+/5+ with resistance Sensation intact  Skin: Skin is warm and dry. She is not diaphoretic.  Mild erythema with minimal drainage from left axilla, small approximately 0.5 cm x 0.5 cm abscess. Mild discomfort upon palpation.   Subcutaneous tissue noted to the right axilla, discomfort upon palpation. Negative induration noted. Negative drainage noted.   Negative streaking bilaterally  Psychiatric: She has a normal mood and affect. Her behavior is normal. Thought content normal.    ED Course   Procedures (including critical care time)  3:15PM Dr. Abran Duke saw and assessed patient. Recommended no antibiotics. As per physician, recommended discharge and close follow-up.   Labs Reviewed - No data to display No results found. 1. Abscess     MDM  Patient presenting to the ED with abscesses to bilateral axilla. Right axilla the most discomfort with grayish drainage today. Patient has history of numerous I&D and abscess formation. Subcutaneous tissue appearing to the right axilla, negative drainage noted. Mild erythema. Pain upon palpation. Negative induration noted. Small approximately 0.5 cm x 0.5 cm abscess with drainage to the left axilla. Dr. Jodi Mourning assessed patient - as per physician for discharge and close follow-up, did not recommend antibiotics. Patient stable, afebrile. Negative streaking to the arms noted - doubt lymphangitis. Possible hidradenitis supprativa. Discharged patient. Discussed with patient proper care of wound. Referred patient to general surgery and PCP. Discussed with patient to continue warm compressions and warm soaks. Discussed with patient to continue to monitor symptoms and if symptoms are to worsen or change to report back to the ED -strict return instructions given. Patient agreed to plan of care, understood, all questions answered.     Raymon Mutton, PA-C 04/25/13 1823

## 2013-04-26 NOTE — ED Provider Notes (Signed)
Medical screening examination/treatment/procedure(s) were conducted as a shared visit with non-physician practitioner(s) or resident  and myself.  I personally evaluated the patient during the encounter and agree with the findings and plan unless otherwise indicated. Recurrent small abscesses.  No significant fluctuance to drain in ED, no crepitus, no cellulitis.  Well appearing.  No fevers.  Fup discussed.  No abx needed at this time.  DC  Enid Skeens, MD 04/26/13 2306

## 2013-06-20 ENCOUNTER — Encounter (HOSPITAL_COMMUNITY): Payer: Self-pay | Admitting: *Deleted

## 2013-06-20 ENCOUNTER — Emergency Department (HOSPITAL_COMMUNITY)
Admission: EM | Admit: 2013-06-20 | Discharge: 2013-06-20 | Disposition: A | Discharge status: Home / Self Care | Payer: Managed Care, Other (non HMO) | Attending: Emergency Medicine | Admitting: Emergency Medicine

## 2013-06-20 DIAGNOSIS — A599 Trichomoniasis, unspecified: Secondary | ICD-10-CM | POA: Insufficient documentation

## 2013-06-20 DIAGNOSIS — N390 Urinary tract infection, site not specified: Secondary | ICD-10-CM

## 2013-06-20 DIAGNOSIS — J45909 Unspecified asthma, uncomplicated: Secondary | ICD-10-CM | POA: Insufficient documentation

## 2013-06-20 DIAGNOSIS — Z3202 Encounter for pregnancy test, result negative: Secondary | ICD-10-CM | POA: Insufficient documentation

## 2013-06-20 LAB — URINALYSIS, ROUTINE W REFLEX MICROSCOPIC
Bilirubin Urine: NEGATIVE
Glucose, UA: NEGATIVE mg/dL
Hgb urine dipstick: NEGATIVE
Ketones, ur: NEGATIVE mg/dL
Nitrite: NEGATIVE
Protein, ur: NEGATIVE mg/dL
Specific Gravity, Urine: 1.012 (ref 1.005–1.030)
Urobilinogen, UA: 0.2 mg/dL (ref 0.0–1.0)
pH: 5.5 (ref 5.0–8.0)

## 2013-06-20 LAB — WET PREP, GENITAL: Yeast Wet Prep HPF POC: NONE SEEN

## 2013-06-20 LAB — URINE MICROSCOPIC-ADD ON

## 2013-06-20 LAB — POCT PREGNANCY, URINE: Preg Test, Ur: NEGATIVE

## 2013-06-20 MED ORDER — METRONIDAZOLE 500 MG PO TABS
2000.0000 mg | ORAL_TABLET | Freq: Once | ORAL | Status: AC
  Administered 2013-06-20: 2000 mg via ORAL
  Filled 2013-06-20: qty 4

## 2013-06-20 MED ORDER — LIDOCAINE HCL (PF) 1 % IJ SOLN
INTRAMUSCULAR | Status: AC
  Administered 2013-06-20: 5 mL
  Filled 2013-06-20: qty 5

## 2013-06-20 MED ORDER — AZITHROMYCIN 250 MG PO TABS
1000.0000 mg | ORAL_TABLET | Freq: Once | ORAL | Status: AC
  Administered 2013-06-20: 1000 mg via ORAL
  Filled 2013-06-20: qty 4

## 2013-06-20 MED ORDER — SULFAMETHOXAZOLE-TRIMETHOPRIM 800-160 MG PO TABS: 1.0000 | ORAL_TABLET | Freq: Two times a day (BID) | ORAL | Status: DC

## 2013-06-20 MED ORDER — CEFTRIAXONE SODIUM 250 MG IJ SOLR
250.0000 mg | Freq: Once | INTRAMUSCULAR | Status: AC
  Administered 2013-06-20: 250 mg via INTRAMUSCULAR
  Filled 2013-06-20: qty 250

## 2013-06-20 NOTE — ED Provider Notes (Signed)
CSN: [id number]     Arrival date & time 06/20/13  1524 History   First MD Initiated Contact with Patient 06/20/13 1851     Chief Complaint  Patient presents with  . Vaginal Discharge   (Consider location/radiation/quality/duration/timing/severity/associated sxs/prior Treatment) HPI  27 year old female presents complaining of dysuria and vaginal discharge. Patient states within the past 3 or 4 days she has noticed some burning when she urinated and also notice some mild vaginal discharge and itching. Symptom has been persistent. Complaining of vaginal discomfort when she first starts urinating. Complain of mild aching, nonradiating, low abdominal pain. No complaints of fever, chills, chest pain, shortness of breath, back pain, hematuria, or rash. She is sexually active and does not use protection every time. No prior history of STD. Her last menstrual period was 2 weeks ago. There has been no specific treatment tried.  Past Medical History  Diagnosis Date  . Asthma    Past Surgical History  Procedure Laterality Date  . Cesarean section     History reviewed. No pertinent family history. History  Substance Use Topics  . Smoking status: Never Smoker   . Smokeless tobacco: Never Used  . Alcohol Use: No   OB History   Grav Para Term Preterm Abortions TAB SAB Ect Mult Living                 Review of Systems  All other systems reviewed and are negative.    Allergies  Review of patient's allergies indicates no known allergies.  Home Medications   Current Outpatient Rx  Name  Route  Sig  Dispense  Refill  . ibuprofen (ADVIL,MOTRIN) 200 MG tablet   Oral   Take 400 mg by mouth every 6 (six) hours as needed for pain or headache.          BP 113/68  Pulse 86  Resp 18  SpO2 97%  LMP 06/06/2013 Physical Exam  Nursing note and vitals reviewed. Constitutional: She appears well-developed and well-nourished. No distress.  HENT:  Head: Normocephalic and atraumatic.  Eyes:  Conjunctivae are normal.  Neck: Normal range of motion. Neck supple.  Cardiovascular: Normal rate and regular rhythm.   Pulmonary/Chest: Effort normal and breath sounds normal. She exhibits no tenderness.  Abdominal: Soft. There is tenderness (mild suprapubic tenderness. No CVA tenderness.). There is no rebound and no guarding.  Genitourinary: Vagina normal and uterus normal. There is no rash or lesion on the right labia. There is no rash or lesion on the left labia. Cervix exhibits no motion tenderness and no discharge. Right adnexum displays no mass and no tenderness. Left adnexum displays no mass and no tenderness. No erythema, tenderness or bleeding around the vagina. No vaginal discharge (Mild functional vaginal discharge) found.  Chaperone present  Lymphadenopathy:       Right: No inguinal adenopathy present.       Left: No inguinal adenopathy present.    ED Course  Procedures (including critical care time)  Patient with mild suprapubic abdominal pain with complaints of dysuria and vaginal discharge. Her urine shows no evidence of urinary tract infection however due to the symptoms will treat for UTI. She also has Trichomonas in her urine. Will treat with Flagyl 2 g. We'll preemptively treat for possible STD with Rocephin and Zithromax. Culture sent. Patient agrees with plan. No significant evidence concerning for PID at this time.  Labs Review Labs Reviewed  WET PREP, GENITAL - Abnormal; Notable for the following:    Trich, Wet  Prep MANY (*)    Clue Cells Wet Prep HPF POC FEW (*)    WBC, Wet Prep HPF POC MANY (*)    All other components within normal limits  URINALYSIS, ROUTINE W REFLEX MICROSCOPIC - Abnormal; Notable for the following:    APPearance CLOUDY (*)    Leukocytes, UA LARGE (*)    All other components within normal limits  URINE MICROSCOPIC-ADD ON - Abnormal; Notable for the following:    Squamous Epithelial / LPF MANY (*)    All other components within normal limits   GC/CHLAMYDIA PROBE AMP  POCT PREGNANCY, URINE   Imaging Review No results found.  MDM   1. Trichomonas   2. UTI (lower urinary tract infection)    BP 119/82  Pulse 88  Resp 16  SpO2 100%  LMP 06/06/2013     Fayrene Helper, PA-C 06/21/13 1502

## 2013-06-20 NOTE — ED Notes (Signed)
Pt reports recently having a UTI. Now beginning to feel a vaginal discomfort when she first starts to urinate, mild itching and vaginal discharge for a week. No acute distress noted at triage.

## 2013-06-21 LAB — GC/CHLAMYDIA PROBE AMP
CT Probe RNA: NEGATIVE
GC Probe RNA: NEGATIVE

## 2013-06-26 NOTE — ED Provider Notes (Signed)
Medical screening examination/treatment/procedure(s) were performed by non-physician practitioner and as supervising physician I was immediately available for consultation/collaboration. Devoria Albe, MD, FACEP   Ward Givens, MD 06/26/13 1600

## 2013-08-04 ENCOUNTER — Encounter (HOSPITAL_COMMUNITY): Payer: Self-pay | Admitting: Emergency Medicine

## 2013-08-04 ENCOUNTER — Emergency Department (HOSPITAL_COMMUNITY)
Admission: EM | Admit: 2013-08-04 | Discharge: 2013-08-05 | Disposition: A | Discharge status: Home / Self Care | Payer: Managed Care, Other (non HMO) | Attending: Emergency Medicine | Admitting: Emergency Medicine

## 2013-08-04 DIAGNOSIS — J45909 Unspecified asthma, uncomplicated: Secondary | ICD-10-CM | POA: Insufficient documentation

## 2013-08-04 DIAGNOSIS — R197 Diarrhea, unspecified: Secondary | ICD-10-CM | POA: Insufficient documentation

## 2013-08-04 DIAGNOSIS — B9689 Other specified bacterial agents as the cause of diseases classified elsewhere: Secondary | ICD-10-CM | POA: Insufficient documentation

## 2013-08-04 DIAGNOSIS — K648 Other hemorrhoids: Secondary | ICD-10-CM | POA: Insufficient documentation

## 2013-08-04 DIAGNOSIS — N760 Acute vaginitis: Secondary | ICD-10-CM | POA: Insufficient documentation

## 2013-08-04 DIAGNOSIS — A499 Bacterial infection, unspecified: Secondary | ICD-10-CM | POA: Insufficient documentation

## 2013-08-04 DIAGNOSIS — R112 Nausea with vomiting, unspecified: Secondary | ICD-10-CM | POA: Insufficient documentation

## 2013-08-04 DIAGNOSIS — IMO0002 Reserved for concepts with insufficient information to code with codable children: Secondary | ICD-10-CM | POA: Insufficient documentation

## 2013-08-04 LAB — CBC WITH DIFFERENTIAL/PLATELET
Basophils Absolute: 0 10*3/uL (ref 0.0–0.1)
Basophils Relative: 0 % (ref 0–1)
Eosinophils Absolute: 0.5 10*3/uL (ref 0.0–0.7)
Eosinophils Relative: 6 % — ABNORMAL HIGH (ref 0–5)
HCT: 36.5 % (ref 36.0–46.0)
Hemoglobin: 12.1 g/dL (ref 12.0–15.0)
Lymphocytes Relative: 44 % (ref 12–46)
Lymphs Abs: 3.3 10*3/uL (ref 0.7–4.0)
MCH: 26.9 pg (ref 26.0–34.0)
MCHC: 33.2 g/dL (ref 30.0–36.0)
MCV: 81.3 fL (ref 78.0–100.0)
Monocytes Absolute: 0.9 10*3/uL (ref 0.1–1.0)
Monocytes Relative: 11 % (ref 3–12)
Neutro Abs: 2.9 10*3/uL (ref 1.7–7.7)
Neutrophils Relative %: 38 % — ABNORMAL LOW (ref 43–77)
Platelets: 206 10*3/uL (ref 150–400)
RBC: 4.49 MIL/uL (ref 3.87–5.11)
RDW: 13.5 % (ref 11.5–15.5)
WBC: 7.5 10*3/uL (ref 4.0–10.5)

## 2013-08-04 NOTE — ED Notes (Signed)
The pt is having abd pain  With n v and diarrhea for one week.  lmp not sure vaginal bleeding for awhile

## 2013-08-04 NOTE — ED Provider Notes (Signed)
CSN: [id number]     Arrival date & time 08/04/13  2309 History   First MD Initiated Contact with Patient 08/04/13 2331     Chief Complaint  Patient presents with  . Abdominal Pain   (Consider location/radiation/quality/duration/timing/severity/associated sxs/prior Treatment) HPI Comments: Patient is a 27 year old female with history of asthma who presents today with bleeding from her rectum and vagina since Monday. She reports that initially it started out as mild vaginal bleeding. Now she has diarrhea when she uses the restroom. She reports that it is dark red blood "pouring out of her butt". She has associated abdominal cramping. Her abdominal pain is generalized. She reports this pain is like a contraction. She has associated nausea and vomiting. Emesis is nonbloody. Vomiting is mild. She denies any urinary urgency, frequency, dysuria. She does have associated dyspareunia. She is unsure if she is pregnant. She denies shortness of breath, pallor, fatigue.   The history is provided by the patient. No language interpreter was used.    Past Medical History  Diagnosis Date  . Asthma    Past Surgical History  Procedure Laterality Date  . Cesarean section     No family history on file. History  Substance Use Topics  . Smoking status: Never Smoker   . Smokeless tobacco: Never Used  . Alcohol Use: No   OB History   Grav Para Term Preterm Abortions TAB SAB Ect Mult Living                 Review of Systems  Constitutional: Negative for fever and chills.  Respiratory: Negative for shortness of breath.   Cardiovascular: Negative for chest pain.  Gastrointestinal: Positive for nausea, vomiting, abdominal pain, diarrhea and blood in stool.  Genitourinary: Positive for vaginal bleeding, menstrual problem, pelvic pain and dyspareunia. Negative for dysuria and urgency.  All other systems reviewed and are negative.    Allergies  Review of patient's allergies indicates no known  allergies.  Home Medications   Current Outpatient Rx  Name  Route  Sig  Dispense  Refill  . ibuprofen (ADVIL,MOTRIN) 200 MG tablet   Oral   Take 400 mg by mouth every 6 (six) hours as needed for pain or headache.         . sulfamethoxazole-trimethoprim (BACTRIM DS,SEPTRA DS) 800-160 MG per tablet   Oral   Take 1 tablet by mouth 2 (two) times daily.   6 tablet   0    BP 125/83  Pulse 100  Resp 16  Wt 180 lb (81.647 kg)  SpO2 99% Physical Exam  Nursing note and vitals reviewed. Constitutional: She is oriented to person, place, and time. She appears well-developed and well-nourished.  Non-toxic appearance. She does not have a sickly appearance. She does not appear ill. No distress.  HENT:  Head: Normocephalic and atraumatic.  Right Ear: External ear normal.  Left Ear: External ear normal.  Nose: Nose normal.  Mouth/Throat: Oropharynx is clear and moist.  Eyes: Conjunctivae are normal.  Neck: Normal range of motion.  Cardiovascular: Normal rate, regular rhythm and normal heart sounds.   Pulmonary/Chest: Effort normal and breath sounds normal. No stridor. No respiratory distress. She has no wheezes. She has no rales.  Abdominal: Soft. She exhibits no distension. There is generalized tenderness. There is no rigidity, no rebound and no guarding.  Genitourinary: Vagina normal. Rectal exam shows internal hemorrhoid. Rectal exam shows no external hemorrhoid, no fissure, no mass, no tenderness and anal tone normal. Guaiac positive stool. Pelvic  exam was performed with patient supine. There is no rash, tenderness, lesion or injury on the right labia. There is no rash, tenderness, lesion or injury on the left labia. Uterus is not enlarged and not tender. Cervix exhibits discharge. Cervix exhibits no motion tenderness and no friability. Right adnexum displays no mass, no tenderness and no fullness. Left adnexum displays no mass, no tenderness and no fullness. No erythema, tenderness or  bleeding around the vagina. No foreign body around the vagina. No signs of injury around the vagina. No vaginal discharge found.  Light bleeding coming from cervical os  Musculoskeletal: Normal range of motion.  Neurological: She is alert and oriented to person, place, and time. She has normal strength.  Skin: Skin is warm and dry. She is not diaphoretic. No erythema.  Psychiatric: She has a normal mood and affect. Her behavior is normal.    ED Course  Procedures (including critical care time) Labs Review Labs Reviewed  WET PREP, GENITAL - Abnormal; Notable for the following:    Clue Cells Wet Prep HPF POC MANY (*)    WBC, Wet Prep HPF POC FEW (*)    All other components within normal limits  CBC WITH DIFFERENTIAL - Abnormal; Notable for the following:    Neutrophils Relative % 38 (*)    Eosinophils Relative 6 (*)    All other components within normal limits  COMPREHENSIVE METABOLIC PANEL - Abnormal; Notable for the following:    Sodium 133 (*)    Glucose, Bld 101 (*)    Total Bilirubin 0.2 (*)    All other components within normal limits  OCCULT BLOOD, POC DEVICE - Abnormal; Notable for the following:    Fecal Occult Bld POSITIVE (*)    All other components within normal limits  GC/CHLAMYDIA PROBE AMP  LIPASE, BLOOD  URINALYSIS, ROUTINE W REFLEX MICROSCOPIC  POCT PREGNANCY, URINE   Imaging Review No results found.  EKG Interpretation   None       MDM   1. Bacterial vaginosis   2. Internal hemorrhoid    Patient presents with vaginal and rectal bleeding. Rectal bleeding likely due to internal hemorrhoids. Pt is not anemic. Pt has no CMT on pelvic exam. Some light vaginal bleeding coming from cervical os. Wet prep shows clue cells. Will tx with flagyl. Discussed not to drink alcohol while on Flagyl. Return instructions given. Referral to wellness center given. Discussed case with Dr. Lavella Lemons who agrees with plan. Vital signs stable for discharge. Patient / Family /  Caregiver informed of clinical course, understand medical decision-making process, and agree with plan.     Mora Bellman, PA-C 08/05/13 1242

## 2013-08-05 LAB — WET PREP, GENITAL
Trich, Wet Prep: NONE SEEN
Yeast Wet Prep HPF POC: NONE SEEN

## 2013-08-05 LAB — URINALYSIS, ROUTINE W REFLEX MICROSCOPIC
Bilirubin Urine: NEGATIVE
Glucose, UA: NEGATIVE mg/dL
Hgb urine dipstick: NEGATIVE
Ketones, ur: NEGATIVE mg/dL
Leukocytes, UA: NEGATIVE
Nitrite: NEGATIVE
Protein, ur: NEGATIVE mg/dL
Specific Gravity, Urine: 1.027 (ref 1.005–1.030)
Urobilinogen, UA: 1 mg/dL (ref 0.0–1.0)
pH: 6.5 (ref 5.0–8.0)

## 2013-08-05 LAB — COMPREHENSIVE METABOLIC PANEL
ALT: 13 U/L (ref 0–35)
AST: 20 U/L (ref 0–37)
Albumin: 3.6 g/dL (ref 3.5–5.2)
Alkaline Phosphatase: 51 U/L (ref 39–117)
BUN: 10 mg/dL (ref 6–23)
CO2: 23 mEq/L (ref 19–32)
Calcium: 8.9 mg/dL (ref 8.4–10.5)
Chloride: 101 mEq/L (ref 96–112)
Creatinine, Ser: 0.7 mg/dL (ref 0.50–1.10)
GFR calc Af Amer: >90 mL/min (ref >90)
GFR calc non Af Amer: >90 mL/min (ref >90)
Glucose, Bld: 101 mg/dL — ABNORMAL HIGH (ref 70–99)
Potassium: 3.5 mEq/L (ref 3.5–5.1)
Sodium: 133 mEq/L — ABNORMAL LOW (ref 135–145)
Total Bilirubin: 0.2 mg/dL — ABNORMAL LOW (ref 0.3–1.2)
Total Protein: 7.5 g/dL (ref 6.0–8.3)

## 2013-08-05 LAB — GC/CHLAMYDIA PROBE AMP
CT Probe RNA: NEGATIVE
GC Probe RNA: NEGATIVE

## 2013-08-05 LAB — POCT PREGNANCY, URINE: Preg Test, Ur: NEGATIVE

## 2013-08-05 LAB — LIPASE, BLOOD: Lipase: 28 U/L (ref 11–59)

## 2013-08-05 LAB — OCCULT BLOOD, POC DEVICE: Fecal Occult Bld: POSITIVE — AB

## 2013-08-05 MED ORDER — OXYCODONE-ACETAMINOPHEN 5-325 MG PO TABS
2.0000 | ORAL_TABLET | Freq: Once | ORAL | Status: AC
  Administered 2013-08-05: 2 via ORAL
  Filled 2013-08-05: qty 2

## 2013-08-05 MED ORDER — METRONIDAZOLE 500 MG PO TABS: 500.0000 mg | ORAL_TABLET | Freq: Two times a day (BID) | ORAL | Status: DC

## 2013-08-05 MED ORDER — ONDANSETRON 4 MG PO TBDP
8.0000 mg | ORAL_TABLET | Freq: Once | ORAL | Status: AC
  Administered 2013-08-05: 8 mg via ORAL
  Filled 2013-08-05: qty 2

## 2013-08-05 NOTE — ED Provider Notes (Signed)
Medical screening examination/treatment/procedure(s) were performed by non-physician practitioner and as supervising physician I was immediately available for consultation/collaboration.     Brandt Loosen, MD 08/05/13 [phone]

## 2013-08-09 NOTE — ED Provider Notes (Signed)
Medical screening examination/treatment/procedure(s) were performed by non-physician practitioner and as supervising physician I was immediately available for consultation/collaboration.     Brandt Loosen, MD 08/09/13 [phone]

## 2013-12-27 ENCOUNTER — Emergency Department (HOSPITAL_COMMUNITY)
Admission: EM | Admit: 2013-12-27 | Discharge: 2013-12-27 | Disposition: A | Discharge status: Home / Self Care | Payer: Managed Care, Other (non HMO) | Attending: Emergency Medicine | Admitting: Emergency Medicine

## 2013-12-27 ENCOUNTER — Encounter (HOSPITAL_COMMUNITY): Payer: Self-pay | Admitting: Emergency Medicine

## 2013-12-27 DIAGNOSIS — IMO0002 Reserved for concepts with insufficient information to code with codable children: Secondary | ICD-10-CM | POA: Insufficient documentation

## 2013-12-27 DIAGNOSIS — L02419 Cutaneous abscess of limb, unspecified: Secondary | ICD-10-CM

## 2013-12-27 DIAGNOSIS — J45909 Unspecified asthma, uncomplicated: Secondary | ICD-10-CM | POA: Insufficient documentation

## 2013-12-27 MED ORDER — IBUPROFEN 800 MG PO TABS: 800.0000 mg | ORAL_TABLET | Freq: Three times a day (TID) | ORAL | Status: DC | PRN

## 2013-12-27 MED ORDER — SULFAMETHOXAZOLE-TRIMETHOPRIM 800-160 MG PO TABS: 1.0000 | ORAL_TABLET | Freq: Two times a day (BID) | ORAL | Status: DC

## 2013-12-27 MED ORDER — HYDROCODONE-ACETAMINOPHEN 5-325 MG PO TABS: 1.0000 | ORAL_TABLET | ORAL | Status: DC | PRN

## 2013-12-27 NOTE — Discharge Instructions (Signed)
Read the information below.  Use the prescribed medication as directed.  Please discuss all new medications with your pharmacist.  Do not take additional tylenol while taking the prescribed pain medication to avoid overdose.  You may return to the Emergency Department at any time for worsening condition or any new symptoms that concern you.  If there is any possibility that you might be pregnant, please let your health care provider know and discuss this with the pharmacist to ensure medication safety.  If you develop redness, swelling, uncontrolled pain, or fevers greater than 100.4, return to the ER immediately for a recheck.     Abscess An abscess is an infected area that contains a collection of pus and debris.It can occur in almost any part of the body. An abscess is also known as a furuncle or boil. CAUSES  An abscess occurs when tissue gets infected. This can occur from blockage of oil or sweat glands, infection of hair follicles, or a minor injury to the skin. As the body tries to fight the infection, pus collects in the area and creates pressure under the skin. This pressure causes pain. People with weakened immune systems have difficulty fighting infections and get certain abscesses more often.  SYMPTOMS Usually an abscess develops on the skin and becomes a painful mass that is red, warm, and tender. If the abscess forms under the skin, you may feel a moveable soft area under the skin. Some abscesses break open (rupture) on their own, but most will continue to get worse without care. The infection can spread deeper into the body and eventually into the bloodstream, causing you to feel ill.  DIAGNOSIS  Your caregiver will take your medical history and perform a physical exam. A sample of fluid may also be taken from the abscess to determine what is causing your infection. TREATMENT  Your caregiver may prescribe antibiotic medicines to fight the infection. However, taking antibiotics alone  usually does not cure an abscess. Your caregiver may need to make a small cut (incision) in the abscess to drain the pus. In some cases, gauze is packed into the abscess to reduce pain and to continue draining the area. HOME CARE INSTRUCTIONS   Only take over-the-counter or prescription medicines for pain, discomfort, or fever as directed by your caregiver.  If you were prescribed antibiotics, take them as directed. Finish them even if you start to feel better.  If gauze is used, follow your caregiver's directions for changing the gauze.  To avoid spreading the infection:  Keep your draining abscess covered with a bandage.  Wash your hands well.  Do not share personal care items, towels, or whirlpools with others.  Avoid skin contact with others.  Keep your skin and clothes clean around the abscess.  Keep all follow-up appointments as directed by your caregiver. SEEK MEDICAL CARE IF:   You have increased pain, swelling, redness, fluid drainage, or bleeding.  You have muscle aches, chills, or a general ill feeling.  You have a fever. MAKE SURE YOU:   Understand these instructions.  Will watch your condition.  Will get help right away if you are not doing well or get worse. Document Released: 06/17/2005 Document Revised: 03/08/2012 Document Reviewed: 11/20/2011 [facility]Exit[facility] Patient Information 2014 [employer], [place].

## 2013-12-27 NOTE — ED Provider Notes (Signed)
CSN: [id number]     Arrival date & time 12/27/13  [id number] History   First MD Initiated Contact with Patient 12/27/13 [id number]     Chief Complaint  Patient presents with  . Abscess     (Consider location/radiation/quality/duration/timing/severity/associated sxs/prior Treatment) The history is provided by the patient.    Patient with hx multiple axillary abscesses in the past and multiple I&Ds (per patient) p/w bilateral axillary abscesses x 2 days.  Pain is constant, worse with palpation, unrelieved with tylenol and warm rags.  Denies fevers, vomiting.    Past Medical History  Diagnosis Date  . Asthma    Past Surgical History  Procedure Laterality Date  . Cesarean section     No family history on file. History  Substance Use Topics  . Smoking status: Never Smoker   . Smokeless tobacco: Never Used  . Alcohol Use: No   OB History   Grav Para Term Preterm Abortions TAB SAB Ect Mult Living                 Review of Systems  Constitutional: Negative for fever, chills and diaphoresis.  Skin: Negative for color change, rash and wound.  Allergic/Immunologic: Negative for immunocompromised state.  Hematological: Does not bruise/bleed easily.  All other systems reviewed and are negative.     Allergies  Review of patient's allergies indicates no known allergies.  Home Medications   Current Outpatient Rx  Name  Route  Sig  Dispense  Refill  . metroNIDAZOLE (FLAGYL) 500 MG tablet   Oral   Take 1 tablet (500 mg total) by mouth 2 (two) times daily. One po bid x 7 days   14 tablet   0    BP 114/77  Pulse 89  Temp(Src) 98.1 F (36.7 C) (Oral)  Resp 14  Ht 5\' 2"  (1.575 m)  Wt 180 lb (81.647 kg)  BMI 32.91 kg/m2  SpO2 99%  LMP 12/05/2013 Physical Exam  Nursing note and vitals reviewed. Constitutional: She appears well-developed and well-nourished. No distress.  HENT:  Head: Normocephalic and atraumatic.  Neck: Neck supple.  Pulmonary/Chest: Effort normal.   Neurological: She is alert.  Skin: She is not diaphoretic.  Right axilla with two small tender nodules.  No fluctuance.  No overlying erythema.  No edema.  No drainage.    Left axilla with small superficial area of induration with crusting and small area of erythema.  No fluctuance or active drainage.      ED Course  Procedures (including critical care time) Labs Review Labs Reviewed - No data to display Imaging Review No results found.   EKG Interpretation None      MDM   Final diagnoses:  Axillary abscess    Pt with hx multiple axillary abscesses presents today with same.  Small superficial abscess in left axilla appears to have been draining.  I offered to I&D this one to ensure proper drainage and patient declined.  Right axilla with two small nodularities (< or = 1 cm), no fluctuance or induration, no overlying erythema.  These do not appear ready for I&D.  Pt verbalizes understanding that she may need to return for I&D if symptoms do not resolve.  D/C home with norco, bactrim, general surgery referral.  Discussed result, findings, treatment, and follow up  with patient.  Pt given return precautions.  Pt verbalizes understanding and agrees with plan.        BooneEmily Percy Winterrowd, PA-C 12/27/13 [phone]

## 2013-12-27 NOTE — ED Notes (Signed)
Pt. reports multiple small abscesses at bilateral axilla onset 2 days ago with no drainage .

## 2013-12-30 NOTE — ED Provider Notes (Signed)
Medical screening examination/treatment/procedure(s) were performed by non-physician practitioner and as supervising physician I was immediately available for consultation/collaboration.   EKG Interpretation None        Ash Mcelwain, MD 12/30/13 0728 

## 2014-03-28 ENCOUNTER — Emergency Department (HOSPITAL_COMMUNITY)
Admission: EM | Admit: 2014-03-28 | Discharge: 2014-03-28 | Disposition: A | Discharge status: Home / Self Care | Payer: Managed Care, Other (non HMO) | Attending: Emergency Medicine | Admitting: Emergency Medicine

## 2014-03-28 ENCOUNTER — Encounter (HOSPITAL_COMMUNITY): Payer: Self-pay | Admitting: Emergency Medicine

## 2014-03-28 ENCOUNTER — Emergency Department (HOSPITAL_COMMUNITY): Payer: Managed Care, Other (non HMO)

## 2014-03-28 DIAGNOSIS — N390 Urinary tract infection, site not specified: Secondary | ICD-10-CM

## 2014-03-28 DIAGNOSIS — Z79899 Other long term (current) drug therapy: Secondary | ICD-10-CM | POA: Insufficient documentation

## 2014-03-28 DIAGNOSIS — Z3202 Encounter for pregnancy test, result negative: Secondary | ICD-10-CM | POA: Insufficient documentation

## 2014-03-28 DIAGNOSIS — Z792 Long term (current) use of antibiotics: Secondary | ICD-10-CM | POA: Insufficient documentation

## 2014-03-28 DIAGNOSIS — N739 Female pelvic inflammatory disease, unspecified: Secondary | ICD-10-CM | POA: Insufficient documentation

## 2014-03-28 DIAGNOSIS — N730 Acute parametritis and pelvic cellulitis: Secondary | ICD-10-CM

## 2014-03-28 DIAGNOSIS — J45909 Unspecified asthma, uncomplicated: Secondary | ICD-10-CM | POA: Insufficient documentation

## 2014-03-28 LAB — WET PREP, GENITAL
Clue Cells Wet Prep HPF POC: NONE SEEN
Trich, Wet Prep: NONE SEEN

## 2014-03-28 LAB — URINALYSIS, ROUTINE W REFLEX MICROSCOPIC
Bilirubin Urine: NEGATIVE
Glucose, UA: NEGATIVE mg/dL
Ketones, ur: 15 mg/dL — AB
Nitrite: NEGATIVE
Protein, ur: 30 mg/dL — AB
Specific Gravity, Urine: 1.034 — ABNORMAL HIGH (ref 1.005–1.030)
Urobilinogen, UA: 0.2 mg/dL (ref 0.0–1.0)
pH: 5.5 (ref 5.0–8.0)

## 2014-03-28 LAB — URINE MICROSCOPIC-ADD ON

## 2014-03-28 LAB — CBC
HCT: 36.8 % (ref 36.0–46.0)
Hemoglobin: 11.8 g/dL — ABNORMAL LOW (ref 12.0–15.0)
MCH: 25.9 pg — ABNORMAL LOW (ref 26.0–34.0)
MCHC: 32.1 g/dL (ref 30.0–36.0)
MCV: 80.7 fL (ref 78.0–100.0)
Platelets: 259 10*3/uL (ref 150–400)
RBC: 4.56 MIL/uL (ref 3.87–5.11)
RDW: 14.2 % (ref 11.5–15.5)
WBC: 9.1 10*3/uL (ref 4.0–10.5)

## 2014-03-28 LAB — HIV ANTIBODY (ROUTINE TESTING W REFLEX): HIV 1&2 Ab, 4th Generation: NONREACTIVE

## 2014-03-28 LAB — BASIC METABOLIC PANEL
Anion gap: 14 (ref 5–15)
BUN: 8 mg/dL (ref 6–23)
CO2: 25 mEq/L (ref 19–32)
Calcium: 9.3 mg/dL (ref 8.4–10.5)
Chloride: 100 mEq/L (ref 96–112)
Creatinine, Ser: 0.68 mg/dL (ref 0.50–1.10)
GFR calc Af Amer: >90 mL/min (ref >90)
GFR calc non Af Amer: >90 mL/min (ref >90)
Glucose, Bld: 88 mg/dL (ref 70–99)
Potassium: 3.9 mEq/L (ref 3.7–5.3)
Sodium: 139 mEq/L (ref 137–147)

## 2014-03-28 LAB — POC URINE PREG, ED: Preg Test, Ur: NEGATIVE

## 2014-03-28 LAB — LIPASE, BLOOD: Lipase: 24 U/L (ref 11–59)

## 2014-03-28 MED ORDER — METRONIDAZOLE 500 MG PO TABS: 500.0000 mg | ORAL_TABLET | Freq: Two times a day (BID) | ORAL | Status: DC

## 2014-03-28 MED ORDER — SODIUM CHLORIDE 0.9 % IV BOLUS (SEPSIS)
1000.0000 mL | Freq: Once | INTRAVENOUS | Status: AC
  Administered 2014-03-28: 1000 mL via INTRAVENOUS

## 2014-03-28 MED ORDER — AZITHROMYCIN 250 MG PO TABS
1000.0000 mg | ORAL_TABLET | Freq: Once | ORAL | Status: AC
  Administered 2014-03-28: 1000 mg via ORAL
  Filled 2014-03-28: qty 4

## 2014-03-28 MED ORDER — DOXYCYCLINE HYCLATE 100 MG PO CAPS: 100.0000 mg | ORAL_CAPSULE | Freq: Two times a day (BID) | ORAL | Status: DC

## 2014-03-28 MED ORDER — CIPROFLOXACIN HCL 500 MG PO TABS: 500.0000 mg | ORAL_TABLET | Freq: Two times a day (BID) | ORAL | Status: DC

## 2014-03-28 MED ORDER — FLUCONAZOLE 100 MG PO TABS
150.0000 mg | ORAL_TABLET | Freq: Once | ORAL | Status: AC
  Administered 2014-03-28: 150 mg via ORAL
  Filled 2014-03-28: qty 2

## 2014-03-28 MED ORDER — DEXTROSE 5 % IV SOLN
1.0000 g | Freq: Once | INTRAVENOUS | Status: AC
  Administered 2014-03-28: 1 g via INTRAVENOUS
  Filled 2014-03-28: qty 10

## 2014-03-28 NOTE — ED Notes (Signed)
Pt comfortable with discharge and follow up instructions. PT declined wheelchair, escorted to waiting area by [place] NT.

## 2014-03-28 NOTE — ED Notes (Signed)
Per pt sts that she has been having abdominal pain, dysuria and possibly hematuria.

## 2014-03-28 NOTE — ED Notes (Signed)
Patient given crackers and water

## 2014-03-28 NOTE — Discharge Instructions (Signed)
Please call your doctor for a followup appointment within 24-48 hours. When you talk to your doctor please let them know that you were seen in the emergency department and have them acquire all of your records so that they can discuss the findings with you and formulate a treatment plan to fully care for your new and ongoing problems. Please call and set-up an appointment with [facility]  Please take antibiotics as prescribed and on a full stomach  Please avoid any sexual activity Please have partner tested Please participate in safe sex habits Please continue to monitor symptoms closely and if symptoms are to worsen or change (fever greater than 101, chills, sweating, nausea, vomiting, chest pain, shortness of breath, difficulty breathing, stomach pain, blood in the urine, blood in the stools, weakness, numbness, tingling, worsening or changes to vaginal discharge) please report back to the ED immediately    Pelvic Inflammatory Disease Pelvic inflammatory disease (PID) refers to an infection in some or all of the female organs. The infection can be in the uterus, ovaries, fallopian tubes, or the surrounding tissues in the pelvis. PID can cause abdominal or pelvic pain that comes on suddenly (acute pelvic pain). PID is a serious infection because it can lead to lasting (chronic) pelvic pain or the inability to have children (infertile).  CAUSES  The infection is often caused by the normal bacteria found in the vaginal tissues. PID may also be caused by an infection that is spread during sexual contact. PID can also occur following:   The birth of a baby.   A miscarriage.   An abortion.   Major pelvic surgery.   The use of an intrauterine device (IUD).   A sexual assault.  RISK FACTORS Certain factors can put a person at higher risk for PID, such as:  Being younger than 25 years.  Being sexually active at [place] age.  Usingnonbarrier  contraception.  Havingmultiple sexual partners.  Having sex with someone who has symptoms of a genital infection.  Using oral contraception. Other times, certain behaviors can increase the possibility of getting PID, such as:  Having sex during your period.  Using a vaginal douche.  Having an intrauterine device (IUD) in place. SYMPTOMS   Abdominal or pelvic pain.   Fever.   Chills.   Abnormal vaginal discharge.  Abnormal uterine bleeding.   Unusual pain shortly after finishing your period. DIAGNOSIS  Your caregiver will choose some of the following methods to make a diagnosis, such as:   Performinga physical exam and history. A pelvic exam typically reveals a very tender uterus and surrounding pelvis.   Ordering laboratory tests including a pregnancy test, blood tests, and urine test.  Orderingcultures of the vagina and cervix to check for a sexually transmitted infection (STI).  Performing an ultrasound.   Performing a laparoscopic procedure to look inside the pelvis.  TREATMENT   Antibiotic medicines may be prescribed and taken by mouth.   Sexual partners may be treated when the infection is caused by a sexually transmitted disease (STD).   Hospitalization may be needed to give antibiotics intravenously.  Surgery may be needed, but this is rare. It may take weeks until you are completely well. If you are diagnosed with PID, you should also be checked for human immunodeficiency virus (HIV). HOME CARE INSTRUCTIONS   If given, take your antibiotics as directed. Finish the medicine even if you start to feel better.   Only take over-the-counter or prescription medicines for pain, discomfort,  or fever as directed by your caregiver.   Do not have sexual intercourse until treatment is completed or as directed by your caregiver. If PID is confirmed, your recent sexual partner(s) will need treatment.   Keep your follow-up appointments. SEEK MEDICAL  CARE IF:   You have increased or abnormal vaginal discharge.   You need prescription medicine for your pain.   You vomit.   You cannot take your medicines.   Your partner has an STD.  SEEK IMMEDIATE MEDICAL CARE IF:   You have a fever.   You have increased abdominal or pelvic pain.   You have chills.   You have pain when you urinate.   You are not better after 72 hours following treatment.  MAKE SURE YOU:   Understand these instructions.  Will watch your condition.  Will get help right away if you are not doing well or get worse. Document Released: 09/07/2005 Document Revised: 01/02/2013 Document Reviewed: 09/03/2011 [facility] Patient Information 2015 [place], [place]. This information is not intended to replace advice given to you by your health care provider. Make sure you discuss any questions you have with your health care provider. Safe Sex Safe sex is about reducing the risk of giving or getting a sexually transmitted disease (STD). STDs are spread through sexual contact involving the genitals, mouth, or rectum. Some STDs can be cured and others cannot. Safe sex can also prevent unintended pregnancies.  WHAT ARE SOME SAFE SEX PRACTICES?  Limit your sexual activity to only one partner who is only having sex with you.  Talk to your partner about his or her past partners, past STDs, and drug use.  Use a condom every time you have sexual intercourse. This includes vaginal, oral, and anal sexual activity. Both females and males should wear condoms during oral sex. Only use latex or polyurethane condoms and water-based lubricants. Using petroleum-based lubricants or oils to lubricate a condom will weaken the condom and increase the chance that it will break. The condom should be in place from the beginning to the end of sexual activity. Wearing a condom reduces, but does not completely eliminate, your risk of getting or giving an STD. STDs can be spread by contact with  infected body fluids and skin.  Get vaccinated for hepatitis B and HPV.  Avoid alcohol and recreational drugs which can affect your judgement. You may forget to use a condom or participate in high-risk sex.  For females, avoid douching after sexual intercourse. Douching can spread an infection farther into the reproductive tract.  Check your body for signs of sores, blisters, rashes, or unusual discharge. See your health care provider if you notice any of these signs.  Avoid sexual contact if you have symptoms of an infection or are being treated for an STD. If you or your partner has herpes, avoid sexual contact when blisters are present. Use condoms at all other times.  If you are at risk of being infected with HIV, it is recommended that you take a prescription medicine daily to prevent HIV infection. This is called pre-exposure prophylaxis (PrEP). You are considered at risk if:  You are a man who has sex with other men (MSM).  You are a heterosexual man or woman who is sexually active with more than one partner.  You take drugs by injection.  You are sexually active with a partner who has HIV.  Talk with your health care provider about whether you are at high risk of being infected with  HIV. If you choose to begin PrEP, you should first be tested for HIV. You should then be tested every 3 months for as long as you are taking PrEP.  See your health care provider for regular screenings, exams, and tests for other STDs. Before having sex with a new partner, each of you should be screened for STDs and should talk about the results with each other. WHAT ARE THE BENEFITS OF SAFE SEX?   There is less chance of getting or giving an STD.  You can prevent unwanted or unintended pregnancies.  By discussing safe sex concerns with your partner, you may increase feelings of intimacy, comfort, trust, and honesty between the two of you. Document Released: 10/15/2004 Document Revised: 09/12/2013  Document Reviewed: 02/29/2012 [facility] Patient Information 2015 [place], [place]. This information is not intended to replace advice given to you by your health care provider. Make sure you discuss any questions you have with your health care provider.   Emergency Department Resource Guide 1) Find a Doctor and Pay Out of Pocket Although you won't have to find out who is covered by your insurance plan, it is a good idea to ask around and get recommendations. You will then need to call the office and see if the doctor you have chosen will accept you as a new patient and what types of options they offer for patients who are self-pay. Some doctors offer discounts or will set up payment plans for their patients who do not have insurance, but you will need to ask so you aren't surprised when you get to your appointment.  2) Contact Your Local Health Department Not all health departments have doctors that can see patients for sick visits, but many do, so it is worth a call to see if yours does. If you don't know where your local health department is, you can check in your phone book. The CDC also has a tool to help you locate your state's health department, and many state websites also have listings of all of their local health departments.  3) Find a Walk-in Clinic If your illness is not likely to be very severe or complicated, you may want to try a walk in clinic. These are popping up all over the country in pharmacies, drugstores, and shopping centers. They're usually staffed by nurse practitioners or physician assistants that have been trained to treat common illnesses and complaints. They're usually fairly quick and inexpensive. However, if you have serious medical issues or chronic medical problems, these are probably not your best option.  No Primary Care Doctor: - Call Health Connect at  [phone] - they can help you locate a primary care doctor that  accepts your insurance, provides certain services,  etc. - Physician Referral Service- [phone]  Chronic Pain Problems: Organization         Address  Phone   Notes  [facility]  [phone] Patients need to be referred by their primary care doctor.   Medication Assistance: Organization         Address  Phone   Notes  [facility] Medication [facility] [address] [place], [place] [address] [phone] --Must be a resident of [facility] -- Must have NO insurance coverage whatsoever (no Medicaid/ Medicare, etc.) -- The pt. MUST have a primary care doctor that directs their care regularly and follows them in the community   [facility]  [phone]   [address]  [phone]  Agencies that provide inexpensive medical care: Organization         Address  Phone   Notes  [facility]  [phone]   [facility]    [phone]) [phone]   [facility] [address] [place], [place] [address] [phone]) [phone]   [facility] [address], [place] [phone]) [phone]   [facility]anned [facility]    [phone](336) [phone]   [facility]    [phone]) [phone]   [facility]  [address], [place] Phone:  [phone](336) [phone], Fax:  [phone] Hours of Operation:  9 am - 6 pm, M-F.  Also accepts Medicaid/Medicare and self-pay.  [facility]  [address], [place] Phone: [phone](336) [phone], Fax: [phone](336) [phone]. Hours of Operation:  8:30 am - 5:30 pm, M-F.  Also accepts Medicaid and self-pay.  [facility] [place] [address], [place] Phone: [phone](336) [phone]   [facility] [address], [place], [place] [phone], Ext. [phone] Mondays & Thursdays: 7-9 AM.  First 15 patients are seen on a first come, first serve basis.    Medicaid-accepting [facility] Providers:  Organization         Address  Phone   Notes  [facility] [address], [place] [phone]) [phone] Also accepts self-pay patients.  [facility] [address], [place]  [phone]) [phone]   [facility] [address], [place] [phone]) [phone]   [facility] [address] [place] [address], [place] [phone]) [phone]   Renaye RakersVeita Bland [address], [place]   [phone](336) [phone] Only accepts [place] Access [place] patients after they have their name applied to their card.   Self-Pay (no insurance) in [facility]:  Organization         Address  Phone   Notes  Sickle Cell Patients, [facility] [address], [place] [phone]) [phone]   [facility] [address], [place] [phone]) [phone]   [facility]  [address], [place] [phone]   [facility]/Dr. Osei-Bonsu  [address] [place] [address], [place] or [id number], [place] [phone]) [phone] Phone number for both [place] and [place] locations is the same.  [facility] [address], [place] [phone](336) [phone]   [facility] [address] [place] [address], [place] or [address] [phone]) [phone]) [phone]   [facility] [address], [place] [phone](336) [phone], phone; [phone](336) [phone], fax Sees patients 1st and 3rd Saturday of every month.  Must not qualify for public or private insurance (i.e. Medicaid, Medicare, [place] Health Choice, Veterans' Benefits)  Household income should be no more than 200% of the poverty level The clinic cannot treat you if you are pregnant or think you are pregnant  Sexually transmitted diseases are not treated at the clinic.    Dental Care: Organization         Address  Phone  Notes  [facility] Department of [facility] [address], [place] [phone]) [phone] Accepts children up to  age 26 who are enrolled in [place] or [place] Health Choice; pregnant women with a Medicaid card; and children who have applied for Medicaid or  [place] Health Choice, but were declined, whose parents can pay a reduced fee at time of service.  [facility] Department of [facility]  [address], [place] [phone] Accepts children up to age 50 who are enrolled in [place] or [place] Health Choice; pregnant women with a Medicaid card; and children who have applied for Medicaid or [place] Health Choice, but were declined, whose parents can pay a reduced fee at time of service.  [facility]  [address] [place], [place] [phone] Patients are seen by appointment only. Walk-ins are not accepted. [facility] will see patients 23 years of age and older. Monday - Tuesday (8am-5pm) Most Wednesdays (8:30-5pm) $30 per visit, cash only  [facility] Adult Dental Access PROGRAM  [address], [facility] [phone] Patients are seen by appointment only. Walk-ins are not accepted. [facility] will see patients 79 years of age and older. One Wednesday Evening (Monthly: Volunteer Based).  $30 per visit, cash only  [employer]  [phone] for adults; Children under age 28, call Graduate Pediatric Dentistry at [phone]. Children aged 39-14, please call [phone] to request a pediatric application.  Dental services are provided in all areas of dental care including fillings, crowns and bridges, complete and partial dentures, implants, gum treatment, root canals, and extractions. Preventive care is also provided. Treatment is provided to both adults and children. Patients are selected via a lottery and there is often a waiting list.   [facility] [address], [place]  [phone] [email or web address]   [facility] [address] [place], [place] [phone], Ext. [phone] Second and Fourth Thursday of  each month, opens at 6:30 AM; Clinic ends at 9 AM.  Patients are seen on a first-come first-served basis, and a limited number are seen during each clinic.   [facility]  [address] [place], [place] [phone]   Eligibility Requirements You must have lived in [place], [place], or [place] counties for at least the last three months.   You cannot be eligible for state or federal sponsored [place], including [employer], [place], or [employer].   You generally cannot be eligible for healthcare insurance through your employer.    How to apply: Eligibility screenings are held every Tuesday and Wednesday afternoon from 1:00 pm until 4:00 pm. You do not need an appointment for the interview!  [facility] [address] [place], [place] [phone]   [facility] Health Department  [phone]   [facility] Health Department  [phone]   [facility] Health Department  [phone]    Behavioral Health Resources in the Community: Intensive Outpatient Programs Organization         Address  Phone  Notes  [facility] [address] [place], [place] [phone]   [facility] Outpatient [address] [place], [place] [phone]   [facility] [address] [place], [place]  [phone]   [facility] [address]  [place], [place] [phone] or [phone]   Substance Abuse Resources Organization         Address  Phone  Notes  [facility] Drug Services  [phone]   [facility]  [phone]   [facility]  [phone]   Floydene Flock  [phone]   Residential & Outpatient Substance Abuse Program  9396280024  Psychological Services Organization         Address  Phone  Notes  [facility]  [phone]   [facility] Services  [phone]   [facility] [phone] [address]  [place] [phone] or [phone]    Mobile Crisis Teams Organization         Address  Phone  Notes  [facility], Mobile Crisis Care Unit  9125259136   [facility]  [address] [place], [place] [phone]   Doristine Locks [address] [place] [phone]    Self-Help/Support Groups Organization         Address  Phone             Notes  [facility]ental [facility] Assoc. of [place] - variety of support groups  336- I7437963 Call for more information  Narcotics Anonymous (NA), [facility] [address], [employer] [place]  2 meetings at this location   Statistician         Address  Phone  Notes  [facility] [address],    [place]  [phone]   [facility]  [address], [place] [address], [place], [place] [phone]   [facility] [address] [place], [place] [phone] Admissions: 8am-3pm M-F  [facility] [address]    [place], [place] [phone]   [facility] [address] [place], [place], [place] [phone]   [facility] [address]  [place], [place] [phone]   [facility] - Intensive Outpatient [address], [place], [place] [phone]   [facility] ([facility]iction [facility] [address]  [place], [place] [phone] or [phone]   [facility] (RTS) [address], [place], [place] [phone] Accepts Medicaid  [facility] [employer] [address]  [place] [phone] Substance Abuse/Addiction Treatment   [facility] Organization         Address  Phone  Notes  [facility]  [phone]   Angie Fava, PhD [address] [place], [place]   [phone] or [phone]   [facility] Behavioral   [address] [place], [place] [phone]     [facility] [address] [place], [place] [phone] Insurance/Medicaid/sponsorship through [facility] and Families [address]                                    [place], [place] [phone] Therapy/tele-psych/case  [facility] [address] [place], [place] [phone]    Dr. Lolly Mustache  [phone]   [facility] [place] [facility]  [employer] [facility] Dept. 1) [address], [place] 2) [address] [place] 3)  [address], [place] [phone]  [phone]   [facility] Child Abuse Hotline 3187137664 or 2676309245 (After Hours)

## 2014-03-28 NOTE — ED Notes (Signed)
Pelvic exam performed without difficulty. Cultures sent to lab for testing.

## 2014-03-28 NOTE — ED Notes (Signed)
Pt states lower abdominal pain, dysuria, hematuria and vaginal discharge. Ongoing x3 days. Pt states severe burning with urination. Also states stomach cramps. 5/10 pain at the time. Pt is alert and oriented x4. NAD.

## 2014-03-28 NOTE — ED Provider Notes (Signed)
Medical screening examination/treatment/procedure(s) were performed by non-physician practitioner and as supervising physician I was immediately available for consultation/collaboration.   EKG Interpretation None        Vanetta MuldersScott Nyelah Emmerich, MD 03/28/14 [phone]

## 2014-03-28 NOTE — ED Provider Notes (Signed)
CSN: [id number]     Arrival date & time 03/28/14  0715 History   First MD Initiated Contact with Patient 03/28/14 [phone]     Chief Complaint  Patient presents with  . Urinary Frequency     (Consider location/radiation/quality/duration/timing/severity/associated sxs/prior Treatment) The history is provided by the patient. No language interpreter was used.  Vanessa Foster is a 28 y/o F with PMhx of asthma presenting to the ED with dysuria, hematuria, and abdominal pain that started on Saturday. Patient reported that the discomfort is localized to the suprapubic region described as a pressure sensation, reported that she has the feeling as if she constantly needs to go to the bathroom. Reported that she noticed dysuria, burning sensation with urination yesterday. Reported that she has also noticed irritation to her vagina a couple of days ago. Stated that she noticed vaginal discharge 4 days ago described as a brownish discoloration with an odor that she cannot described. Stated that she has been using over the counter medications for pain relief. Stated that she has had UTIs in the past with the same presentation. Reported that she is sexually active, but does not use protection and stated that she is currently not on any form of birth control. Stated that her last encounter was 8 days ago. Denied fever, chills, chest pain, shortness of breath, difficulty breathing, weakness, headache, dizziness, hematochezia, melena, nausea, vomiting, diarrhea, rashes.  PCP none   Past Medical History  Diagnosis Date  . Asthma    Past Surgical History  Procedure Laterality Date  . Cesarean section     History reviewed. No pertinent family history. History  Substance Use Topics  . Smoking status: Never Smoker   . Smokeless tobacco: Never Used  . Alcohol Use: No   OB History   Grav Para Term Preterm Abortions TAB SAB Ect Mult Living                 Review of Systems  Constitutional: Negative for  fever and chills.  Respiratory: Negative for chest tightness and shortness of breath.   Gastrointestinal: Positive for abdominal pain (suprapubic). Negative for nausea, vomiting, diarrhea, constipation, blood in stool and anal bleeding.  Genitourinary: Positive for dysuria, hematuria, vaginal discharge and vaginal pain. Negative for decreased urine volume, vaginal bleeding and pelvic pain.  Neurological: Negative for dizziness, weakness and numbness.      Allergies  Review of patient's allergies indicates no known allergies.  Home Medications   Prior to Admission medications   Medication Sig Start Date End Date Taking? Authorizing Provider  ciprofloxacin (CIPRO) 500 MG tablet Take 1 tablet (500 mg total) by mouth 2 (two) times daily. 03/28/14   Shirlene Andaya, PA-C  doxycycline (VIBRAMYCIN) 100 MG capsule Take 1 capsule (100 mg total) by mouth 2 (two) times daily. One po bid x 7 days 03/28/14   Odalys Win, PA-C  metroNIDAZOLE (FLAGYL) 500 MG tablet Take 1 tablet (500 mg total) by mouth 2 (two) times daily. 03/28/14   Rhythm Gubbels, PA-C   BP 98/56  Pulse 71  Temp(Src) 98 F (36.7 C) (Oral)  Resp 18  SpO2 100% Physical Exam  Nursing note and vitals reviewed. Constitutional: She is oriented to person, place, and time. She appears well-developed and well-nourished. No distress.  HENT:  Head: Normocephalic and atraumatic.  Mouth/Throat: Oropharynx is clear and moist. No oropharyngeal exudate.  Eyes: Conjunctivae and EOM are normal. Pupils are equal, round, and reactive to light. Right eye exhibits no discharge. Left eye  exhibits no discharge.  Neck: Normal range of motion. No tracheal deviation present.  Cardiovascular: Normal rate, regular rhythm and normal heart sounds.  Exam reveals no friction rub.   No murmur heard. Pulses:      Radial pulses are 2+ on the right side, and 2+ on the left side.       Dorsalis pedis pulses are 2+ on the right side, and 2+ on the left side.   Pulmonary/Chest: Effort normal and breath sounds normal. No respiratory distress. She has no wheezes. She has no rales.  Abdominal: Soft. Bowel sounds are normal. She exhibits no distension. There is tenderness in the suprapubic area. There is no rebound, no guarding and no CVA tenderness.  Negative abdominal distension noted BS normoactive in all 4 quadrants Abdomen soft upon palpation Mild discomfort upon palpation to the suprapubic region Negative rigidity or guarding  Negative peritoneal signs  Genitourinary:  Pelvic exam: Negative swelling, erythema, inflammation, lesions, sores noted to the external genitalia. Negative swelling, erythema, inflammation, lesions, sores noted to the vaginal canal. Thick white, brownish tinged - blood tinged- discharge with foul odor from the cervix noted. Negative cervical inflammation, lesions, sores, deformities noted. Mild CMT tenderness noted. Positive right adnexal tenderness, negative left adnexal tenderness on exam.  Chaperone with tech  Musculoskeletal: Normal range of motion. She exhibits no edema and no tenderness.  Full ROM to upper and lower extremities without difficulty noted, negative ataxia noted.  Lymphadenopathy:    She has no cervical adenopathy.  Neurological: She is alert and oriented to person, place, and time. No cranial nerve deficit. She exhibits normal muscle tone. Coordination normal.  Cranial nerves III-XII grossly intact Strength 5+/5+ to upper and lower extremities bilaterally with resistance applied, equal distribution noted Equal grip strength  Negative facial drooping Negative slurred speech  Negative aphasia  Skin: Skin is warm and dry. No rash noted. She is not diaphoretic. No erythema.  Psychiatric: She has a normal mood and affect. Her behavior is normal. Thought content normal.    ED Course  Procedures (including critical care time)  2:34 PM This provider re-assessed the patient. Patient sitting comfortable in  bed. BS normoactive in all 4 quadrants, soft abdomen upon palpation with mild discomfort upon palpation to the suprapubic region. Negative peritoneal signs. Negative guarding or rigidity noted.   Results for orders placed during the hospital encounter of 03/28/14  WET PREP, GENITAL      Result Value Ref Range   Yeast Wet Prep HPF POC MANY (*) NONE SEEN   Trich, Wet Prep NONE SEEN  NONE SEEN   Clue Cells Wet Prep HPF POC NONE SEEN  NONE SEEN   WBC, Wet Prep HPF POC TOO NUMEROUS TO COUNT (*) NONE SEEN  URINALYSIS, ROUTINE W REFLEX MICROSCOPIC      Result Value Ref Range   Color, Urine AMBER (*) YELLOW   APPearance CLOUDY (*) CLEAR   Specific Gravity, Urine 1.034 (*) 1.005 - 1.030   pH 5.5  5.0 - 8.0   Glucose, UA NEGATIVE  NEGATIVE mg/dL   Hgb urine dipstick LARGE (*) NEGATIVE   Bilirubin Urine NEGATIVE  NEGATIVE   Ketones, ur 15 (*) NEGATIVE mg/dL   Protein, ur 30 (*) NEGATIVE mg/dL   Urobilinogen, UA 0.2  0.0 - 1.0 mg/dL   Nitrite NEGATIVE  NEGATIVE   Leukocytes, UA MODERATE (*) NEGATIVE  URINE MICROSCOPIC-ADD ON      Result Value Ref Range   Squamous Epithelial / LPF MANY (*)  RARE   WBC, UA 7-10  <3 WBC/hpf   RBC / HPF 3-6  <3 RBC/hpf   Bacteria, UA MANY (*) RARE   Urine-Other MUCOUS PRESENT    CBC      Result Value Ref Range   WBC 9.1  4.0 - 10.5 K/uL   RBC 4.56  3.87 - 5.11 MIL/uL   Hemoglobin 11.8 (*) 12.0 - 15.0 g/dL   HCT 16.136.8  09.636.0 - 04.546.0 %   MCV 80.7  78.0 - 100.0 fL   MCH 25.9 (*) 26.0 - 34.0 pg   MCHC 32.1  30.0 - 36.0 g/dL   RDW 40.914.2  81.111.5 - 91.415.5 %   Platelets 259  150 - 400 K/uL  BASIC METABOLIC PANEL      Result Value Ref Range   Sodium 139  137 - 147 mEq/L   Potassium 3.9  3.7 - 5.3 mEq/L   Chloride 100  96 - 112 mEq/L   CO2 25  19 - 32 mEq/L   Glucose, Bld 88  70 - 99 mg/dL   BUN 8  6 - 23 mg/dL   Creatinine, Ser 7.820.68  0.50 - 1.10 mg/dL   Calcium 9.3  8.4 - 95.610.5 mg/dL   GFR calc non Af Amer >90  >90 mL/min   GFR calc Af Amer >90  >90 mL/min   Anion  gap 14  5 - 15  LIPASE, BLOOD      Result Value Ref Range   Lipase 24  11 - 59 U/L  POC URINE PREG, ED      Result Value Ref Range   Preg Test, Ur NEGATIVE  NEGATIVE    Labs Review Labs Reviewed  WET PREP, GENITAL - Abnormal; Notable for the following:    Yeast Wet Prep HPF POC MANY (*)    WBC, Wet Prep HPF POC TOO NUMEROUS TO COUNT (*)    All other components within normal limits  URINALYSIS, ROUTINE W REFLEX MICROSCOPIC - Abnormal; Notable for the following:    Color, Urine AMBER (*)    APPearance CLOUDY (*)    Specific Gravity, Urine 1.034 (*)    Hgb urine dipstick LARGE (*)    Ketones, ur 15 (*)    Protein, ur 30 (*)    Leukocytes, UA MODERATE (*)    All other components within normal limits  URINE MICROSCOPIC-ADD ON - Abnormal; Notable for the following:    Squamous Epithelial / LPF MANY (*)    Bacteria, UA MANY (*)    All other components within normal limits  CBC - Abnormal; Notable for the following:    Hemoglobin 11.8 (*)    MCH 25.9 (*)    All other components within normal limits  GC/CHLAMYDIA PROBE AMP  URINE CULTURE  BASIC METABOLIC PANEL  LIPASE, BLOOD  HIV ANTIBODY (ROUTINE TESTING)  RPR  POC URINE PREG, ED    Imaging Review [place] Transvaginal Non-ob  03/28/2014   CLINICAL DATA:  Tenderness.  EXAM: TRANSABDOMINAL AND TRANSVAGINAL ULTRASOUND OF PELVIS  DOPPLER ULTRASOUND OF OVARIES  TECHNIQUE: Both transabdominal and transvaginal ultrasound examinations of the pelvis were performed. Transabdominal technique was performed for global imaging of the pelvis including uterus, ovaries, adnexal regions, and pelvic cul-de-sac.  It was necessary to proceed with endovaginal exam following the transabdominal exam to visualize the uterus and ovaries. Color and duplex Doppler ultrasound was utilized to evaluate blood flow to the ovaries.  COMPARISON:  None.  FINDINGS: Uterus  Measurements: 10.2 x 5.2  x 6.6 cm. No fibroids or other mass visualized. Nabothian cysts noted.   Endometrium  Thickness: 3.6 mm.  No focal abnormality visualized.  Right ovary  Measurements: 2.9 x 1.8 x 2.5 cm. Normal appearance/no adnexal mass.  Left ovary  Measurements: 6.1 x 4.7 x 6 cm. 3.9 x 3.6 x 5.2 cm left ovarian cyst, most likely simple.  Pulsed Doppler evaluation of both ovaries demonstrates normal low-resistance arterial and venous waveforms.  Other findings  No free fluid. Exam is slightly limited as the bladder was not prepped for transabdominal ultrasound.  IMPRESSION: 1. No evidence of ovarian torsion. 2. 5.2 cm left ovarian cyst, most likely simple. This is almost certainly benign, but follow up ultrasound is recommended in 1 year according to the Society of Radiologists in Ultrasound2010 Consensus Conference Statement (D Lenis Noon et al. Management of Asymptomatic Ovarian and Other Adnexal Cysts Imaged at [place]: Society of Radiologists in Ultrasound Consensus Conference Statement 2010. Radiology 256 (Sept 2010): 943-954.).   Electronically Signed   By: Maisie Fus  Register   On: 03/28/2014 11:27   US Pelvis Complete  03/28/2014   CLINICAL DATA:  Tenderness.  EXAM: TRANSABDOMINAL AND TRANSVAGINAL ULTRASOUND OF PELVIS  DOPPLER ULTRASOUND OF OVARIES  TECHNIQUE: Both transabdominal and transvaginal ultrasound examinations of the pelvis were performed. Transabdominal technique was performed for global imaging of the pelvis including uterus, ovaries, adnexal regions, and pelvic cul-de-sac.  It was necessary to proceed with endovaginal exam following the transabdominal exam to visualize the uterus and ovaries. Color and duplex Doppler ultrasound was utilized to evaluate blood flow to the ovaries.  COMPARISON:  None.  FINDINGS: Uterus  Measurements: 10.2 x 5.2 x 6.6 cm. No fibroids or other mass visualized. Nabothian cysts noted.  Endometrium  Thickness: 3.6 mm.  No focal abnormality visualized.  Right ovary  Measurements: 2.9 x 1.8 x 2.5 cm. Normal appearance/no adnexal mass.  Left ovary  Measurements: 6.1 x  4.7 x 6 cm. 3.9 x 3.6 x 5.2 cm left ovarian cyst, most likely simple.  Pulsed Doppler evaluation of both ovaries demonstrates normal low-resistance arterial and venous waveforms.  Other findings  No free fluid. Exam is slightly limited as the bladder was not prepped for transabdominal ultrasound.  IMPRESSION: 1. No evidence of ovarian torsion. 2. 5.2 cm left ovarian cyst, most likely simple. This is almost certainly benign, but follow up ultrasound is recommended in 1 year according to the Society of Radiologists in Ultrasound2010 Consensus Conference Statement (D Lenis Noon et al. Management of Asymptomatic Ovarian and Other Adnexal Cysts Imaged at [place]: Society of Radiologists in Ultrasound Consensus Conference Statement 2010. Radiology 256 (Sept 2010): 943-954.).   Electronically Signed   By: Maisie Fus  Register   On: 03/28/2014 11:27   [place] Art/ven Flow Abd Pelv Doppler  03/28/2014   CLINICAL DATA:  Tenderness.  EXAM: TRANSABDOMINAL AND TRANSVAGINAL ULTRASOUND OF PELVIS  DOPPLER ULTRASOUND OF OVARIES  TECHNIQUE: Both transabdominal and transvaginal ultrasound examinations of the pelvis were performed. Transabdominal technique was performed for global imaging of the pelvis including uterus, ovaries, adnexal regions, and pelvic cul-de-sac.  It was necessary to proceed with endovaginal exam following the transabdominal exam to visualize the uterus and ovaries. Color and duplex Doppler ultrasound was utilized to evaluate blood flow to the ovaries.  COMPARISON:  None.  FINDINGS: Uterus  Measurements: 10.2 x 5.2 x 6.6 cm. No fibroids or other mass visualized. Nabothian cysts noted.  Endometrium  Thickness: 3.6 mm.  No focal abnormality visualized.  Right ovary  Measurements: 2.9  x 1.8 x 2.5 cm. Normal appearance/no adnexal mass.  Left ovary  Measurements: 6.1 x 4.7 x 6 cm. 3.9 x 3.6 x 5.2 cm left ovarian cyst, most likely simple.  Pulsed Doppler evaluation of both ovaries demonstrates normal low-resistance arterial and venous  waveforms.  Other findings  No free fluid. Exam is slightly limited as the bladder was not prepped for transabdominal ultrasound.  IMPRESSION: 1. No evidence of ovarian torsion. 2. 5.2 cm left ovarian cyst, most likely simple. This is almost certainly benign, but follow up ultrasound is recommended in 1 year according to the Society of Radiologists in Ultrasound2010 Consensus Conference Statement (D Lenis Noon et al. Management of Asymptomatic Ovarian and Other Adnexal Cysts Imaged at [place]: Society of Radiologists in Ultrasound Consensus Conference Statement 2010. Radiology 256 (Sept 2010): [phone].).   Electronically Signed   By: Maisie Fus  Register   On: 03/28/2014 11:27     EKG Interpretation None      MDM   Final diagnoses:  PID (acute pelvic inflammatory disease)  Urinary tract infection without hematuria, site unspecified    Medications  fluconazole (DIFLUCAN) tablet 150 mg (not administered)  sodium chloride 0.9 % bolus 1,000 mL (0 mLs Intravenous Stopped 03/28/14 1116)  cefTRIAXone (ROCEPHIN) 1 g in dextrose 5 % 50 mL IVPB (0 g Intravenous Stopped 03/28/14 1307)  azithromycin (ZITHROMAX) tablet 1,000 mg (1,000 mg Oral Given 03/28/14 1306)    Filed Vitals:   03/28/14 1415 03/28/14 1430 03/28/14 1445 03/28/14 1454  BP: 94/73 110/75 103/73 98/56  Pulse: 75 84 74 71  Temp:    98 F (36.7 C)  TempSrc:    Oral  Resp:    18  SpO2: 100% 100% 100% 100%   CBC negative elevated white blood cell count. BMP negative findings-kidney function well, electrolytes within proper balance. Lipase negative elevation. Urine pregnancy negative. Urinalysis noted large hemoglobin with moderate leukocytes white blood cell count of 7-10. Elevated specific gravity of 1.034. Many bacteria noted. Urine culture pending. Wet prep noted many yeast as well as too numerous to count white blood cells. Negative trichomoniasis or clue cells noted. GC Chlamydia probe pending. HIV antibody pending. Pelvic ultrasound is negative  evidence of ovarian portion. 5.2 cm left ovarian cyst likely simple-recommended ultrasound to be repeated within one year. Doubt ectopic pregnancy. Doubt molar pregnancy. Doubt ovarian torsion. Doubt appendicitis. Doubt pancreatitis. Nonsurgical abdomen noted on exam. Negative findings of TOA. Suspicion to be PID and UTI, cannot rule out possible pyelonephritis. Patient treated prophylactically in ED setting for STD with rocephin and azithromycin. Patient treated with Diflucan for yeast infection. Patient stable, afebrile. Patient not septic appearing. Discussed case with attending physician, Dr. Keane Police - who recommended discharge -as per attending, recommended patient to be discharged with Doxycycline, Cipro, and Flagyl. Discharged patient. Discharged patient with antibiotics. Discussed with patient to rest and stay hydrated. Referred to [facility]. Discussed with patient to avoid any sexual activity and recommended partner to be tested for STDs. Discussed with patient importance of hydration. Discussed with patient that if symptoms do not improve within 24-48 hours to report back to the ED. Discussed with patient to closely monitor symptoms and if symptoms are to worsen or change to report back to the ED - strict return instructions given.  Patient agreed to plan of care, understood, all questions answered.   Raymon Mutton, PA-C 03/28/14 1526

## 2014-03-29 LAB — RPR

## 2014-03-29 LAB — URINE CULTURE
Colony Count: 80000
Special Requests: NORMAL

## 2014-03-29 LAB — GC/CHLAMYDIA PROBE AMP
CT Probe RNA: NEGATIVE
GC Probe RNA: NEGATIVE

## 2014-04-30 ENCOUNTER — Emergency Department (HOSPITAL_COMMUNITY)
Admission: EM | Admit: 2014-04-30 | Discharge: 2014-04-30 | Disposition: A | Discharge status: Home / Self Care | Payer: Managed Care, Other (non HMO) | Attending: Emergency Medicine | Admitting: Emergency Medicine

## 2014-04-30 ENCOUNTER — Encounter (HOSPITAL_COMMUNITY): Payer: Self-pay | Admitting: Emergency Medicine

## 2014-04-30 DIAGNOSIS — Z79899 Other long term (current) drug therapy: Secondary | ICD-10-CM | POA: Insufficient documentation

## 2014-04-30 DIAGNOSIS — J45909 Unspecified asthma, uncomplicated: Secondary | ICD-10-CM | POA: Diagnosis not present

## 2014-04-30 DIAGNOSIS — B3789 Other sites of candidiasis: Secondary | ICD-10-CM | POA: Insufficient documentation

## 2014-04-30 DIAGNOSIS — N390 Urinary tract infection, site not specified: Secondary | ICD-10-CM | POA: Diagnosis not present

## 2014-04-30 DIAGNOSIS — B3731 Acute candidiasis of vulva and vagina: Secondary | ICD-10-CM

## 2014-04-30 DIAGNOSIS — N898 Other specified noninflammatory disorders of vagina: Secondary | ICD-10-CM | POA: Diagnosis not present

## 2014-04-30 DIAGNOSIS — K649 Unspecified hemorrhoids: Secondary | ICD-10-CM

## 2014-04-30 DIAGNOSIS — Z3202 Encounter for pregnancy test, result negative: Secondary | ICD-10-CM | POA: Insufficient documentation

## 2014-04-30 DIAGNOSIS — N949 Unspecified condition associated with female genital organs and menstrual cycle: Secondary | ICD-10-CM | POA: Insufficient documentation

## 2014-04-30 DIAGNOSIS — D649 Anemia, unspecified: Secondary | ICD-10-CM

## 2014-04-30 DIAGNOSIS — K5900 Constipation, unspecified: Secondary | ICD-10-CM | POA: Diagnosis not present

## 2014-04-30 DIAGNOSIS — B373 Candidiasis of vulva and vagina: Secondary | ICD-10-CM

## 2014-04-30 LAB — BASIC METABOLIC PANEL
Anion gap: 11 (ref 5–15)
BUN: 8 mg/dL (ref 6–23)
CO2: 25 mEq/L (ref 19–32)
Calcium: 9.3 mg/dL (ref 8.4–10.5)
Chloride: 103 mEq/L (ref 96–112)
Creatinine, Ser: 0.65 mg/dL (ref 0.50–1.10)
GFR calc Af Amer: >90 mL/min (ref >90)
GFR calc non Af Amer: >90 mL/min (ref >90)
Glucose, Bld: 104 mg/dL — ABNORMAL HIGH (ref 70–99)
Potassium: 3.8 mEq/L (ref 3.7–5.3)
Sodium: 139 mEq/L (ref 137–147)

## 2014-04-30 LAB — CBC WITH DIFFERENTIAL/PLATELET
Basophils Absolute: 0 10*3/uL (ref 0.0–0.1)
Basophils Relative: 0 % (ref 0–1)
Eosinophils Absolute: 0.3 10*3/uL (ref 0.0–0.7)
Eosinophils Relative: 3 % (ref 0–5)
HCT: 33.8 % — ABNORMAL LOW (ref 36.0–46.0)
Hemoglobin: 10.9 g/dL — ABNORMAL LOW (ref 12.0–15.0)
Lymphocytes Relative: 29 % (ref 12–46)
Lymphs Abs: 2.6 10*3/uL (ref 0.7–4.0)
MCH: 26 pg (ref 26.0–34.0)
MCHC: 32.2 g/dL (ref 30.0–36.0)
MCV: 80.5 fL (ref 78.0–100.0)
Monocytes Absolute: 0.6 10*3/uL (ref 0.1–1.0)
Monocytes Relative: 7 % (ref 3–12)
Neutro Abs: 5.5 10*3/uL (ref 1.7–7.7)
Neutrophils Relative %: 61 % (ref 43–77)
Platelets: 226 10*3/uL (ref 150–400)
RBC: 4.2 MIL/uL (ref 3.87–5.11)
RDW: 14.4 % (ref 11.5–15.5)
WBC: 8.9 10*3/uL (ref 4.0–10.5)

## 2014-04-30 LAB — WET PREP, GENITAL
Clue Cells Wet Prep HPF POC: NONE SEEN
Trich, Wet Prep: NONE SEEN

## 2014-04-30 LAB — URINE MICROSCOPIC-ADD ON

## 2014-04-30 LAB — URINALYSIS, ROUTINE W REFLEX MICROSCOPIC
Bilirubin Urine: NEGATIVE
Glucose, UA: NEGATIVE mg/dL
Ketones, ur: NEGATIVE mg/dL
Nitrite: NEGATIVE
Protein, ur: 30 mg/dL — AB
Specific Gravity, Urine: 1.03 (ref 1.005–1.030)
Urobilinogen, UA: 0.2 mg/dL (ref 0.0–1.0)
pH: 5.5 (ref 5.0–8.0)

## 2014-04-30 LAB — POC URINE PREG, ED: Preg Test, Ur: NEGATIVE

## 2014-04-30 MED ORDER — KETOROLAC TROMETHAMINE 30 MG/ML IJ SOLN
30.0000 mg | Freq: Once | INTRAMUSCULAR | Status: AC
  Administered 2014-04-30: 30 mg via INTRAVENOUS
  Filled 2014-04-30: qty 1

## 2014-04-30 MED ORDER — SULFAMETHOXAZOLE-TMP DS 800-160 MG PO TABS: 1.0000 | ORAL_TABLET | Freq: Two times a day (BID) | ORAL | Status: DC

## 2014-04-30 MED ORDER — SODIUM CHLORIDE 0.9 % IV BOLUS (SEPSIS)
1000.0000 mL | Freq: Once | INTRAVENOUS | Status: AC
  Administered 2014-04-30: 1000 mL via INTRAVENOUS

## 2014-04-30 MED ORDER — ONDANSETRON 4 MG PO TBDP: 4.0000 mg | ORAL_TABLET | Freq: Three times a day (TID) | ORAL | Status: DC | PRN

## 2014-04-30 MED ORDER — FLUCONAZOLE 150 MG PO TABS: 150.0000 mg | ORAL_TABLET | Freq: Once | ORAL | Status: DC

## 2014-04-30 MED ORDER — FERROUS SULFATE 325 (65 FE) MG PO TABS: 325.0000 mg | ORAL_TABLET | Freq: Every day | ORAL | Status: DC

## 2014-04-30 MED ORDER — FLUCONAZOLE 100 MG PO TABS
200.0000 mg | ORAL_TABLET | Freq: Once | ORAL | Status: AC
  Administered 2014-04-30: 200 mg via ORAL
  Filled 2014-04-30: qty 2

## 2014-04-30 MED ORDER — POLYETHYLENE GLYCOL 3350 17 G PO PACK: 17.0000 g | PACK | Freq: Every day | ORAL | Status: DC

## 2014-04-30 NOTE — ED Notes (Signed)
Patient here from home with complaint of vaginal itching, pain, and discharge. States that she was seen here about a month ago for the same. Review of chart shows that patient was discharged with doxycycline, ciprofloxacin, and metronidazole. Patient states that she couldn't take the medications because they made her sick. States that she didn't complete any of the prescribed medicines. States that the symptoms are the same as previous visit.

## 2014-04-30 NOTE — Discharge Instructions (Signed)
Your labs showed you had some anemia, start taking the iron as directed (with breakfast, and a glass of orange juice), and use miralax for constipation as the iron can cause some constipation. Your labs also showed that you have a vaginal yeast infection, use diflucan in 3 days (05/03/14) and follow up with the women's clinic for further gynecologic issues. Your urine shows some signs of infection. Stay very well hydrated with plenty of water throughout the day. Take antibiotic until completed. Follow up with primary care physician in 1 week for recheck of ongoing symptoms but return to ER for emergent changing or worsening of symptoms. Use zofran as needed for nausea. Please seek immediate care if you develop the following: You develop back pain.  Your symptoms are no better, or worse in 3 days. There is severe back pain or lower abdominal pain.  You develop chills.  You have a fever.  There is nausea or vomiting.  There is continued burning or discomfort with urination.    Anemia, Nonspecific Anemia is a condition in which the concentration of red blood cells or hemoglobin in the blood is below normal. Hemoglobin is a substance in red blood cells that carries oxygen to the tissues of the body. Anemia results in not enough oxygen reaching these tissues.  CAUSES  Common causes of anemia include:   Excessive bleeding. Bleeding may be internal or external. This includes excessive bleeding from periods (in women) or from the intestine.   Poor nutrition.   Chronic kidney, thyroid, and liver disease.  Bone marrow disorders that decrease red blood cell production.  Cancer and treatments for cancer.  HIV, AIDS, and their treatments.  Spleen problems that increase red blood cell destruction.  Blood disorders.  Excess destruction of red blood cells due to infection, medicines, and autoimmune disorders. SIGNS AND SYMPTOMS   Minor weakness.   Dizziness.   Headache.  Palpitations.    Shortness of breath, especially with exercise.   Paleness.  Cold sensitivity.  Indigestion.  Nausea.  Difficulty sleeping.  Difficulty concentrating. Symptoms may occur suddenly or they may develop slowly.  DIAGNOSIS  Additional blood tests are often needed. These help your health care provider determine the best treatment. Your health care provider will check your stool for blood and look for other causes of blood loss.  TREATMENT  Treatment varies depending on the cause of the anemia. Treatment can include:   Supplements of iron, vitamin B12, or folic acid.   Hormone medicines.   A blood transfusion. This may be needed if blood loss is severe.   Hospitalization. This may be needed if there is significant continual blood loss.   Dietary changes.  Spleen removal. HOME CARE INSTRUCTIONS Keep all follow-up appointments. It often takes many weeks to correct anemia, and having your health care provider check on your condition and your response to treatment is very important. SEEK IMMEDIATE MEDICAL CARE IF:   You develop extreme weakness, shortness of breath, or chest pain.   You become dizzy or have trouble concentrating.  You develop heavy vaginal bleeding.   You develop a rash.   You have bloody or black, tarry stools.   You faint.   You vomit up blood.   You vomit repeatedly.   You have abdominal pain.  You have a fever or persistent symptoms for more than 2-3 days.   You have a fever and your symptoms suddenly get worse.   You are dehydrated.  MAKE SURE YOU:  Understand these  instructions.  Will watch your condition.  Will get help right away if you are not doing well or get worse. Document Released: 10/15/2004 Document Revised: 05/10/2013 Document Reviewed: 03/03/2013 [facility] Patient Information 2015 [place], [place]. This information is not intended to replace advice given to you by your health care provider. Make sure you discuss  any questions you have with your health care provider.  Candidal Vulvovaginitis Candidal vulvovaginitis is an infection of the vagina and vulva. The vulva is the skin around the opening of the vagina. This may cause itching and discomfort in and around the vagina.  HOME CARE  Only take medicine as told by your doctor.  Do not have sex (intercourse) until the infection is healed or as told by your doctor.  Practice safe sex.  Tell your sex partner about your infection.  Do not douche or use tampons.  Wear cotton underwear. Do not wear tight pants or panty hose.  Eat yogurt. This may help treat and prevent yeast infections. GET HELP RIGHT AWAY IF:   You have a fever.  Your problems get worse during treatment or do not get better in 3 days.  You have discomfort, irritation, or itching in your vagina or vulva area.  You have pain after sex.  You start to get belly (abdominal) pain. MAKE SURE YOU:  Understand these instructions.  Will watch your condition.  Will get help right away if you are not doing well or get worse. Document Released: 12/04/2008 Document Revised: 09/12/2013 Document Reviewed: 12/04/2008 [facility] Patient Information 2015 [place], [place]. This information is not intended to replace advice given to you by your health care provider. Make sure you discuss any questions you have with your health care provider.  Urinary Tract Infection A urinary tract infection (UTI) can occur any place along the urinary tract. The tract includes the kidneys, ureters, bladder, and urethra. A type of germ called bacteria often causes a UTI. UTIs are often helped with antibiotic medicine.  HOME CARE   If given, take antibiotics as told by your doctor. Finish them even if you start to feel better.  Drink enough fluids to keep your pee (urine) clear or pale yellow.  Avoid tea, drinks with caffeine, and bubbly (carbonated) drinks.  Pee often. Avoid holding your pee in for a long  time.  Pee before and after having sex (intercourse).  Wipe from front to back after you poop (bowel movement) if you are a woman. Use each tissue only once. GET HELP RIGHT AWAY IF:   You have back pain.  You have lower belly (abdominal) pain.  You have chills.  You feel sick to your stomach (nauseous).  You throw up (vomit).  Your burning or discomfort with peeing does not go away.  You have a fever.  Your symptoms are not better in 3 days. MAKE SURE YOU:   Understand these instructions.  Will watch your condition.  Will get help right away if you are not doing well or get worse. Document Released: 02/24/2008 Document Revised: 06/01/2012 Document Reviewed: 04/07/2012 [facility] Patient Information 2015 [place] CenterExitCare, [place]. This information is not intended to replace advice given to you by your health care provider. Make sure you discuss any questions you have with your health care provider.

## 2014-04-30 NOTE — ED Provider Notes (Signed)
CSN: [id number]     Arrival date & time 04/30/14  0545 History   First MD Initiated Contact with Patient 04/30/14 [phone]     Chief Complaint  Patient presents with  . Pelvic Pain     (Consider location/radiation/quality/duration/timing/severity/associated sxs/prior Treatment) HPI Comments: Vanessa Foster is a 28 y.o. Female with a PMHx of asthma, constipation, and hemorrhoids, who presents to the ED today with complaints of recurrent vaginal/pelvic pain for the last 4 days. She states she was seen here a month ago for similar complaints, and received antibiotics which she took for 5 days and could not tolerate them therefore she stopped taking them. She states that she's had a moderate "sore" pain in her vagina which initially was improved but over the last 4 days has gotten worse, constant, nonradiating, with no known aggravating or alleviating factors. States she's also having white vaginal discharge, without itching, and some mild lower abd pain and lower back pain which are similar to cramps. Denies dyspareunia, dysuria, hematuria, fevers, chills, N/V/D, flank pain, CP, SOB, vaginal bleeding, myalgias, or arthralgias. Continues to pass flatus. LMP 03/29/14. States she last had sex >1wk ago, unprotected, with 1 sexual partner (same as last visit). She has not tried anything for her symptoms. Does not have a PCP  Patient is a 28 y.o. female presenting with pelvic pain. The history is provided by the patient. No language interpreter was used.  Pelvic Pain This is a recurrent problem. The current episode started in the past 7 days (4 days). The problem occurs constantly. The problem has been gradually worsening. Associated symptoms include abdominal pain (LLQ). Pertinent negatives include no arthralgias, change in bowel habit, chest pain, chills, fever, headaches, myalgias, nausea, neck pain, numbness, rash, urinary symptoms, vomiting or weakness. Nothing aggravates the symptoms. She has tried  nothing for the symptoms. The treatment provided no relief.    Past Medical History  Diagnosis Date  . Asthma    Past Surgical History  Procedure Laterality Date  . Cesarean section     No family history on file. History  Substance Use Topics  . Smoking status: Never Smoker   . Smokeless tobacco: Never Used  . Alcohol Use: No   OB History   Grav Para Term Preterm Abortions TAB SAB Ect Mult Living                 Review of Systems  Constitutional: Negative for fever and chills.  Eyes: Negative for pain.  Respiratory: Negative for shortness of breath.   Cardiovascular: Negative for chest pain.  Gastrointestinal: Positive for abdominal pain (LLQ) and constipation (chronic). Negative for nausea, vomiting, diarrhea, blood in stool, abdominal distention, rectal pain and change in bowel habit.  Genitourinary: Positive for vaginal discharge, vaginal pain and pelvic pain. Negative for dysuria, urgency, frequency, hematuria, flank pain, decreased urine volume, vaginal bleeding, difficulty urinating, genital sores, menstrual problem and dyspareunia.  Musculoskeletal: Positive for back pain (lower back). Negative for arthralgias, myalgias, neck pain and neck stiffness.  Skin: Negative for rash.  Neurological: Negative for dizziness, weakness, light-headedness, numbness and headaches.  Psychiatric/Behavioral: Negative for confusion.  10 Systems reviewed and are negative for acute change except as noted in the HPI.     Allergies  Review of patient's allergies indicates no known allergies.  Home Medications   Prior to Admission medications   Medication Sig Start Date End Date Taking? Authorizing Provider  ferrous sulfate 325 (65 FE) MG tablet Take 1 tablet (325 mg total)  by mouth daily. 04/30/14   Diogenes Whirley Strupp Camprubi-Soms, Vanessa Foster  fluconazole (DIFLUCAN) 150 MG tablet Take 1 tablet (150 mg total) by mouth once. TAKE THIS DOSE ON 05/03/14 05/03/14   Vanessa FallsMercedes Strupp Camprubi-Soms, Vanessa Foster    ondansetron (ZOFRAN ODT) 4 MG disintegrating tablet Take 1 tablet (4 mg total) by mouth every 8 (eight) hours as needed for nausea or vomiting. 04/30/14   Vanessa FallsMercedes Strupp Camprubi-Soms, Vanessa Foster  polyethylene glycol (MIRALAX / GLYCOLAX) packet Take 17 g by mouth daily. For constipation 04/30/14   Vanessa Blazejewski Strupp Campr[place]i-Soms, Vanessa Foster  sulfamethoxazole-trimethoprim (BACTRIM DS) 800-160 MG per tablet Take 1 tablet by mouth 2 (two) times daily. For 7 days 04/30/14   Vanessa FallsMercedes Strupp Camprubi-Soms, Vanessa Foster   BP 112/73  Pulse 98  Temp(Src) 98.4 F (36.9 C) (Oral)  Resp 14  Ht 5\' 3"  (1.6 m)  Wt 175 lb (79.379 kg)  BMI 31.01 kg/m2  SpO2 100%  LMP 03/29/2014 Physical Exam  Nursing note and vitals reviewed. Constitutional: She is oriented to person, place, and time. Vital signs are normal. She appears well-developed and well-nourished. No distress.  Afebrile, nontoxic, NAD  HENT:  Head: Normocephalic and atraumatic.  Mouth/Throat: Mucous membranes are normal.  Eyes: Conjunctivae and EOM are normal. Right eye exhibits no discharge. Left eye exhibits no discharge.  Neck: Normal range of motion. Neck supple.  Cardiovascular: Normal rate, regular rhythm, normal heart sounds and intact distal pulses.   No murmur heard. Pulmonary/Chest: Effort normal and breath sounds normal. No respiratory distress. She has no wheezes. She has no rhonchi. She has no rales.  Abdominal: Soft. Normal appearance and bowel sounds are normal. She exhibits no distension. There is tenderness in the suprapubic area and left lower quadrant. There is no rigidity, no rebound, no guarding, no CVA tenderness, no tenderness at McBurney's point and negative Murphy's sign.    Soft, nondistended, +BS throughout. No r/g/r, no murphy's, no mcburney's. No CVA TTP. Mild TTP in LLQ and suprapubic areas.  Genitourinary: Uterus normal. Pelvic exam was performed with patient supine. There is no rash, tenderness or injury on the right labia. There is no  rash, tenderness or injury on the left labia. Cervix exhibits no motion tenderness, no discharge and no friability. Right adnexum displays no mass, no tenderness and no fullness. Left adnexum displays no mass, no tenderness and no fullness. There is erythema and tenderness around the vagina. No bleeding around the vagina. No foreign body around the vagina. No signs of injury around the vagina. Vaginal discharge found.  No rashes, lesions, or tenderness to external genitalia. No injury to vaginal mucosa. Moderate erythema and tenderness to vaginal mucosa, very macerated and beefy red. Moderate amount of thick white vaginal discharge. No bleeding within vaginal vault. No adnexal masses, tenderness, or fullness. No CMT, cervical friability, or discharge from cervical os. Uterus non-deviated, mobile, nonTTP, and without enlargement.   Musculoskeletal: Normal range of motion.  Neurological: She is alert and oriented to person, place, and time.  Skin: Skin is warm, dry and intact. No rash noted.  Psychiatric: She has a normal mood and affect.    ED Course  Procedures (including critical care time) Labs Review Labs Reviewed  WET PREP, GENITAL - Abnormal; Notable for the following:    Yeast Wet Prep HPF POC TOO NUMEROUS TO COUNT (*)    WBC, Wet Prep HPF POC TOO NUMEROUS TO COUNT (*)    All other components within normal limits  CBC WITH DIFFERENTIAL - Abnormal; Notable for the following:  Hemoglobin 10.9 (*)    HCT 33.8 (*)    All other components within normal limits  URINALYSIS, ROUTINE W REFLEX MICROSCOPIC - Abnormal; Notable for the following:    APPearance CLOUDY (*)    Hgb urine dipstick TRACE (*)    Protein, ur 30 (*)    Leukocytes, UA LARGE (*)    All other components within normal limits  BASIC METABOLIC PANEL - Abnormal; Notable for the following:    Glucose, Bld 104 (*)    All other components within normal limits  URINE MICROSCOPIC-ADD ON - Abnormal; Notable for the following:     Squamous Epithelial / LPF MANY (*)    All other components within normal limits  GC/CHLAMYDIA PROBE AMP  URINE CULTURE  POC URINE PREG, ED    Imaging Review No results found.   EKG Interpretation None      MDM   Final diagnoses:  Yeast infection of the vagina  UTI (lower urinary tract infection)  Anemia, unspecified anemia type    28y/o female with pelvic pain similar to pain for which she was seen here last month and given PID antibiotics which she did not complete. U/S at that time revealed L ovarian cyst. Will obtain labs, U/A, and Upreg, and perform pelvic. Give IV Toradol 30mg , and reassess after labs return. Afebrile and nontoxic, given that pt is about to start her period, this may represent mittelschmerz. Doubt intraabdominal process at this time, will not obtain CT now but will reassess the need for this after pelvic exam. Will decide on need for transvaginal U/S after pelvic exam.  7:15 AM UPreg neg. Pelvic revealing erythematous vaginal mucosa with thick white discharge consistent with yeast infection. It appears this was present in the last wet prep and pt was given Diflucan x1 dose. No adnexal TTP or masses, no CMT. Doubt PID at this time, will not proceed with U/S. GC/CT negative last time, and pt was treated last month, given that there have been no changes in sexual partners, will not prophylactically treat. HIV/RPR negative at last visit, no need to re-test given no change in sexual partners.  7:39 AM BP slightly down, will give 1L NS, although her BPs are consistent with prior visits. H/H slightly down from previous visit, 10.9/33.8 today and MCV 80.5, likely iron deficiency anemia. Will rx iron and miralax, and have pt establish care with a PCP. Wet prep showing TNTC yeast, will give diflucan 200mg  today and rx for 150 dose in 72hrs. Awaiting BMP and U/A.  8:48 AM BP improved with fluids. BMP WNL. U/A questionable for UTI, but does appear to be a dirty catch. Will  treat for UTI given clinical picture, to cover all possible sources of pain. Will rx bactrim x7 days. Will send for culture. Discussed f/up with PCP for anemia, and women's clinic for future GYN concerns, given that this is 2nd yeast infection in 2 mos, she may warrant prophylactic yeast treatment. I explained the diagnosis and have given explicit precautions to return to the ER including for any other new or worsening symptoms. The patient understands and accepts the medical plan as it's been dictated and I have answered their questions. Discharge instructions concerning home care and prescriptions have been given. The patient is STABLE and is discharged to home in good condition.  BP 112/73  Pulse 98  Temp(Src) 98.4 F (36.9 C) (Oral)  Resp 14  Ht 5\' 3"  (1.6 m)  Wt 175 lb (79.379 kg)  BMI 31.01  kg/m2  SpO2 100%  LMP 03/29/2014  Meds ordered this encounter  Medications  . ketorolac (TORADOL) 30 MG/ML injection 30 mg    Sig:   . sodium chloride 0.9 % bolus 1,000 mL    Sig:   . fluconazole (DIFLUCAN) tablet 200 mg    Sig:   . sulfamethoxazole-trimethoprim (BACTRIM DS) 800-160 MG per tablet    Sig: Take 1 tablet by mouth 2 (two) times daily. For 7 days    Dispense:  14 tablet    Refill:  0    Order Specific Question:  Supervising Provider    Answer:  Eber Hong D [[id number]]  . fluconazole (DIFLUCAN) 150 MG tablet    Sig: Take 1 tablet (150 mg total) by mouth once. TAKE THIS DOSE ON 05/03/14    Dispense:  1 tablet    Refill:  0    Order Specific Question:  Supervising Provider    Answer:  Eber Hong D [[id number]]  . polyethylene glycol (MIRALAX / GLYCOLAX) packet    Sig: Take 17 g by mouth daily. For constipation    Dispense:  14 each    Refill:  0    Order Specific Question:  Supervising Provider    Answer:  Eber Hong D [[id number]]  . ondansetron (ZOFRAN ODT) 4 MG disintegrating tablet    Sig: Take 1 tablet (4 mg total) by mouth every 8 (eight) hours as needed for nausea or vomiting.     Dispense:  15 tablet    Refill:  0    Order Specific Question:  Supervising Provider    Answer:  Eber Hong D [[id number]]  . ferrous sulfate 325 (65 FE) MG tablet    Sig: Take 1 tablet (325 mg total) by mouth daily.    Dispense:  30 tablet    Refill:  0    Order Specific Question:  Supervising Provider    Answer:  Vanessa Roller [address] [place], Vanessa Foster 04/30/14 [phone]

## 2014-05-01 LAB — GC/CHLAMYDIA PROBE AMP
CT Probe RNA: NEGATIVE
GC Probe RNA: NEGATIVE

## 2014-05-01 NOTE — ED Provider Notes (Signed)
Medical screening examination/treatment/procedure(s) were performed by non-physician practitioner and as supervising physician I was immediately available for consultation/collaboration.   EKG Interpretation None        Solly Derasmo, MD 05/01/14 0247 

## 2014-05-02 LAB — URINE CULTURE: Colony Count: >=100000

## 2014-05-03 ENCOUNTER — Telehealth (HOSPITAL_BASED_OUTPATIENT_CLINIC_OR_DEPARTMENT_OTHER): Payer: Self-pay | Admitting: Emergency Medicine

## 2014-05-03 NOTE — Telephone Encounter (Signed)
Post ED Visit - Positive Culture Follow-up  Culture report reviewed by antimicrobial stewardship pharmacist: []  Wes Dulaney, Pharm.D., BCPS []  Celedonio MiyamotoJeremy Frens, Pharm.D., BCPS []  Georgina PillionElizabeth Martin, Pharm.D., BCPS []  Eagle HarborMinh Pham, [address] BoulevardPharm[address]D., BCPS, AAHIVP []  Estella HuskMichelle Turner, Pharm.D., BCPS, AAHIVP [x]  Red ChristiansSamson Lee, Pharm.D. []  Tennis Mustassie Stewart, [place]arm[place]D.  Positive Urine culture Treated with Bactrim DS, organism sensitive to the same and no further patient follow-up is required at this time.  Jiles HaroldGammons, Madelon Welsch Chaney 05/03/2014, 7:06 PM

## 2014-06-18 ENCOUNTER — Emergency Department (HOSPITAL_COMMUNITY)
Admission: EM | Admit: 2014-06-18 | Discharge: 2014-06-18 | Discharge status: Absent without leave | Payer: Managed Care, Other (non HMO) | Source: Home / Self Care

## 2014-06-18 ENCOUNTER — Ambulatory Visit
Admission: RE | Admit: 2014-06-18 | Discharge: 2014-06-18 | Disposition: A | Discharge status: Home / Self Care | Payer: Worker's Compensation | Source: Ambulatory Visit | Attending: Family Medicine | Admitting: Family Medicine

## 2014-06-18 ENCOUNTER — Other Ambulatory Visit: Payer: Self-pay | Admitting: Family Medicine

## 2014-06-18 DIAGNOSIS — S39012S Strain of muscle, fascia and tendon of lower back, sequela: Secondary | ICD-10-CM

## 2014-06-18 NOTE — ED Notes (Addendum)
.....  patient was to be in Occupational Health; came to wrong facillity

## 2014-07-08 ENCOUNTER — Emergency Department (HOSPITAL_COMMUNITY)
Admission: EM | Admit: 2014-07-08 | Discharge: 2014-07-08 | Disposition: A | Discharge status: Home / Self Care | Payer: Managed Care, Other (non HMO) | Attending: Emergency Medicine | Admitting: Emergency Medicine

## 2014-07-08 ENCOUNTER — Encounter (HOSPITAL_COMMUNITY): Payer: Self-pay | Admitting: Emergency Medicine

## 2014-07-08 DIAGNOSIS — J45909 Unspecified asthma, uncomplicated: Secondary | ICD-10-CM | POA: Diagnosis not present

## 2014-07-08 DIAGNOSIS — Z79899 Other long term (current) drug therapy: Secondary | ICD-10-CM | POA: Diagnosis not present

## 2014-07-08 DIAGNOSIS — M5442 Lumbago with sciatica, left side: Secondary | ICD-10-CM | POA: Diagnosis not present

## 2014-07-08 DIAGNOSIS — Z792 Long term (current) use of antibiotics: Secondary | ICD-10-CM | POA: Insufficient documentation

## 2014-07-08 DIAGNOSIS — M79605 Pain in left leg: Secondary | ICD-10-CM | POA: Diagnosis present

## 2014-07-08 MED ORDER — PREDNISONE 20 MG PO TABS: ORAL_TABLET | ORAL | Status: DC

## 2014-07-08 MED ORDER — CYCLOBENZAPRINE HCL 10 MG PO TABS: 10.0000 mg | ORAL_TABLET | Freq: Two times a day (BID) | ORAL | Status: DC | PRN

## 2014-07-08 MED ORDER — TRAMADOL HCL 50 MG PO TABS: 50.0000 mg | ORAL_TABLET | Freq: Four times a day (QID) | ORAL | Status: DC | PRN

## 2014-07-08 NOTE — ED Notes (Signed)
Pt c/o left leg pain and left foot pain since this morning. Pt denies any falls, or injury to left leg. Pt ambulatory to triage room, CMS intact. No deformity, swelling noted to left foot. 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

## 2014-07-08 NOTE — ED Provider Notes (Signed)
Medical screening examination/treatment/procedure(s) were performed by non-physician practitioner and as supervising physician I was immediately available for consultation/collaboration.   EKG Interpretation None        An Schnabel, MD 07/08/14 2340 

## 2014-07-08 NOTE — ED Provider Notes (Signed)
CSN: [id number]     Arrival date & time 07/08/14  1909 History  This chart was scribed for non-physician practitioner, Junius FinnerErin O'Malley, PA-C working with Glynn OctaveStephen Rancour, MD by Greggory StallionKayla Andersen, ED scribe. This patient was seen in room TR06C/TR06C and the patient's care was started at 8:03 PM.   Chief Complaint  Patient presents with  . Leg Pain   The history is provided by the patient. No language interpreter was used.   HPI Comments: Vanessa Foster is a 28 y.o. female who presents to the Emergency Department complaining of left leg pain that radiates down to her foot that started earlier this morning. 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 Bearing weight worsens the pain. She has taken ibuprofen with no relief. Pt states she is currently in physical therapy for a lower back injury but just started one week ago. She injured her back trying to catch a falling patient at work. The last time she had it was 3 days ago. Denies bowel or bladder incontinence. Denies history of back surgery or diabetes.   Past Medical History  Diagnosis Date  . Asthma    Past Surgical History  Procedure Laterality Date  . Cesarean section     History reviewed. No pertinent family history. History  Substance Use Topics  . Smoking status: Never Smoker   . Smokeless tobacco: Never Used  . Alcohol Use: No   OB History   Grav Para Term Preterm Abortions TAB SAB Ect Mult Living                 Review of Systems  Genitourinary:       Negative for bowel or bladder incontinence.  Musculoskeletal: Positive for arthralgias and myalgias.  All other systems reviewed and are negative.  Allergies  Review of patient's allergies indicates no known allergies.  Home Medications   Prior to Admission medications   Medication Sig Start Date End Date Taking? Authorizing Provider  cyclobenzaprine (FLEXERIL) 10 MG tablet Take 1 tablet (10 mg total) by mouth 2 (two) times daily as needed for muscle  spasms. 07/08/14   Junius FinnerErin O'Malley, PA-C  ferrous sulfate 325 (65 FE) MG tablet Take 1 tablet (325 mg total) by mouth daily. 04/30/14   Mercedes Strupp Camprubi-Soms, PA-C  fluconazole (DIFLUCAN) 150 MG tablet Take 1 tablet (150 mg total) by mouth once. TAKE THIS DOSE ON 05/03/14 05/03/14   Donnita FallsMercedes Strupp Camprubi-Soms, PA-C  ondansetron (ZOFRAN ODT) 4 MG disintegrating tablet Take 1 tablet (4 mg total) by mouth every 8 (eight) hours as needed for nausea or vomiting. 04/30/14   Donnita FallsMercedes Strupp Camprubi-Soms, PA-C  polyethylene glycol (MIRALAX / GLYCOLAX) packet Take 17 g by mouth daily. For constipation 04/30/14   Mercedes Strupp Camprubi-Soms, PA-C  predniSONE (DELTASONE) 20 MG tablet 3 tabs po day one, then 2 po daily x 4 days 07/08/14   Junius FinnerErin O'Malley, PA-C  sulfamethoxazole-trimethoprim (BACTRIM DS) 800-160 MG per tablet Take 1 tablet by mouth 2 (two) times daily. For 7 days 04/30/14   Donnita FallsMercedes Strupp Camprubi-Soms, PA-C  traMADol (ULTRAM) 50 MG tablet Take 1 tablet (50 mg total) by mouth every 6 (six) hours as needed. 07/08/14   Junius FinnerErin O'Malley, PA-C   BP 127/80  Pulse 84  Temp(Src) 98.7 F (37.1 C) (Oral)  Resp 18  Ht 5\' 2"  (1.575 m)  Wt 180 lb (81.647 kg)  BMI 32.91 kg/m2  SpO2 100%  LMP 07/06/2014  Physical Exam  Nursing note and vitals  reviewed. Constitutional: She is oriented to person, place, and time. She appears well-developed and well-nourished.  HENT:  Head: Normocephalic and atraumatic.  Eyes: EOM are normal.  Neck: Normal range of motion.  Cardiovascular: Normal rate.   Pedal pulses 2+.  Pulmonary/Chest: Effort normal.  Musculoskeletal: Normal range of motion.  Tender to lumbar paraspinal muscles and left buttock. Full ROM of all extremities. 4/5 strength in left extremity versus right due to pain. Sensation intact.  Neurological: She is alert and oriented to person, place, and time.  Antalgic gait.   Skin: Skin is warm and dry.  Psychiatric: She has a normal mood and  affect. Her behavior is normal.    ED Course  Procedures (including critical care time)  DIAGNOSTIC STUDIES: Oxygen Saturation is 100% on RA, normal by my interpretation.    COORDINATION OF CARE: 8:06 PM-Discussed treatment plan which includes a muscle relaxer and a short course of tramadol pain medication with pt at bedside and pt agreed to plan. Will give pt PCP referrals and advised her to follow up.   Labs Review Labs Reviewed - No data to display  Imaging Review No results found.   EKG Interpretation None      MDM   Final diagnoses:  Left-sided low back pain with left-sided sciatica   Pt presenting to ED with left side sciatic pain. Currently in PT for same. Has PT tomorrow. No new injuries. No red flag symptoms.  Do not believe imaging needed at this time. Not concerned for emergent process taking place. Will tx symptomatically as needed for pain.  I personally performed the services described in this documentation, which was scribed in my presence. The recorded information has been reviewed and is accurate.  Junius FinnerErin O'Malley, PA-C 07/08/14 2019

## 2014-07-08 NOTE — ED Notes (Signed)
PT c/o left inside of mouth pain (unsure if it is a tooth or the gums) since yesterday. Pt also c/o left sided foot and leg sharp shooting pain (currently in PT for same) but states pain is worse.

## 2014-10-03 IMAGING — CR DG LUMBAR SPINE COMPLETE 4+V
5 series · 5 of 5 positions shown · non-contrast
Comparison: None.

CLINICAL DATA: Low back pain status post fall

EXAM:
LUMBAR SPINE - COMPLETE 4+ VIEW

[t l-spine a.p.]
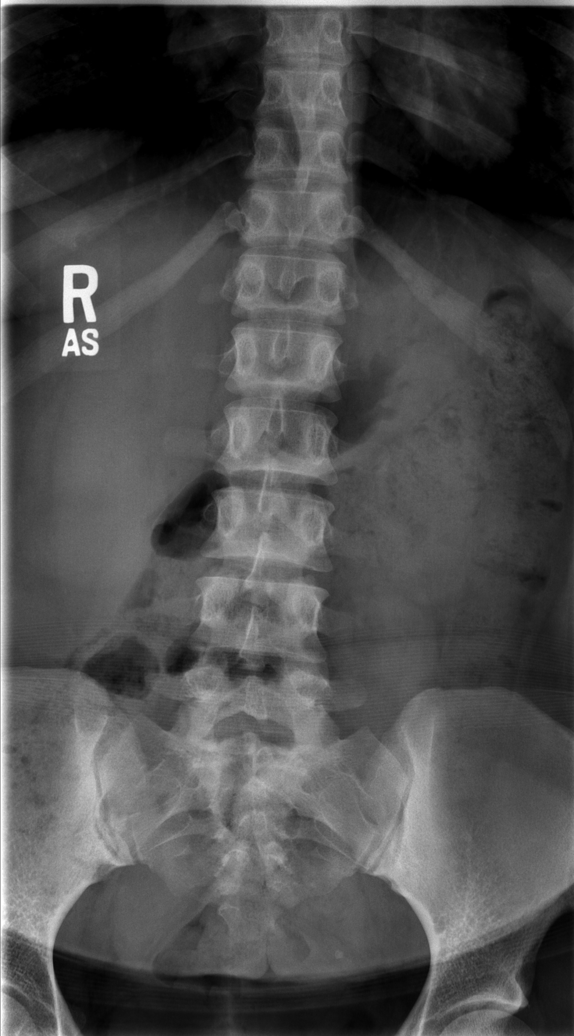

[t l-spine oblique exposure (1 of 2)]
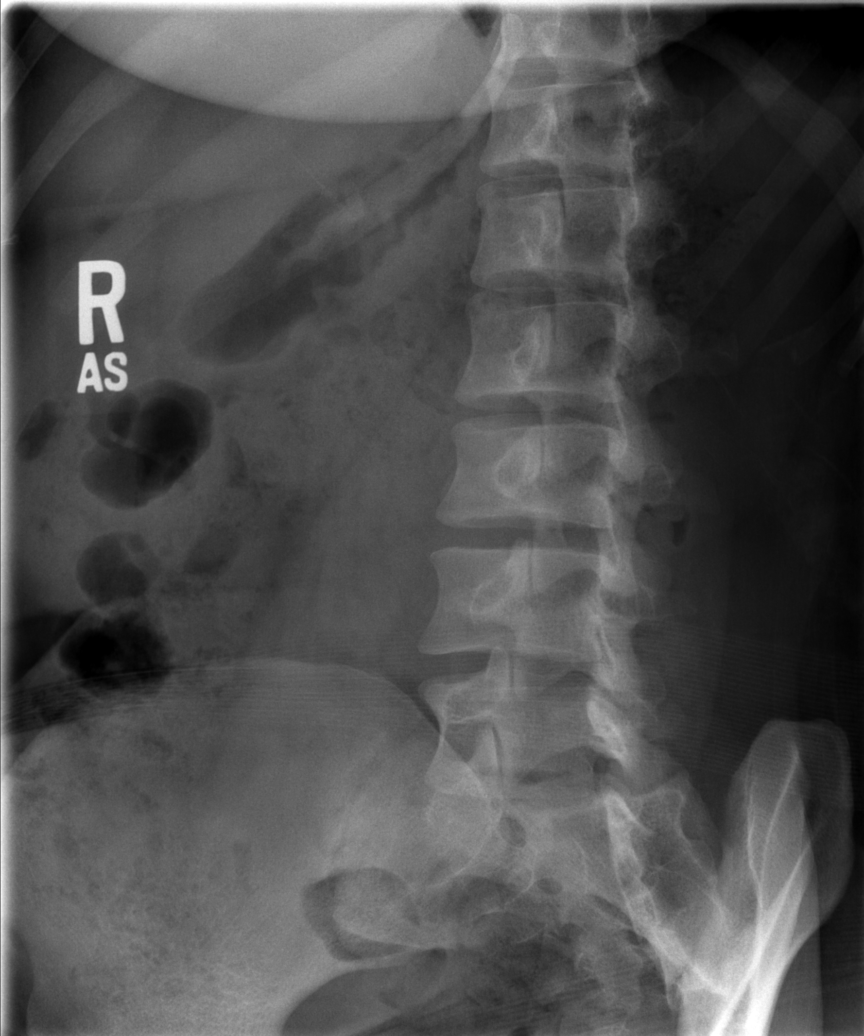

[t l-spine oblique exposure (2 of 2)]
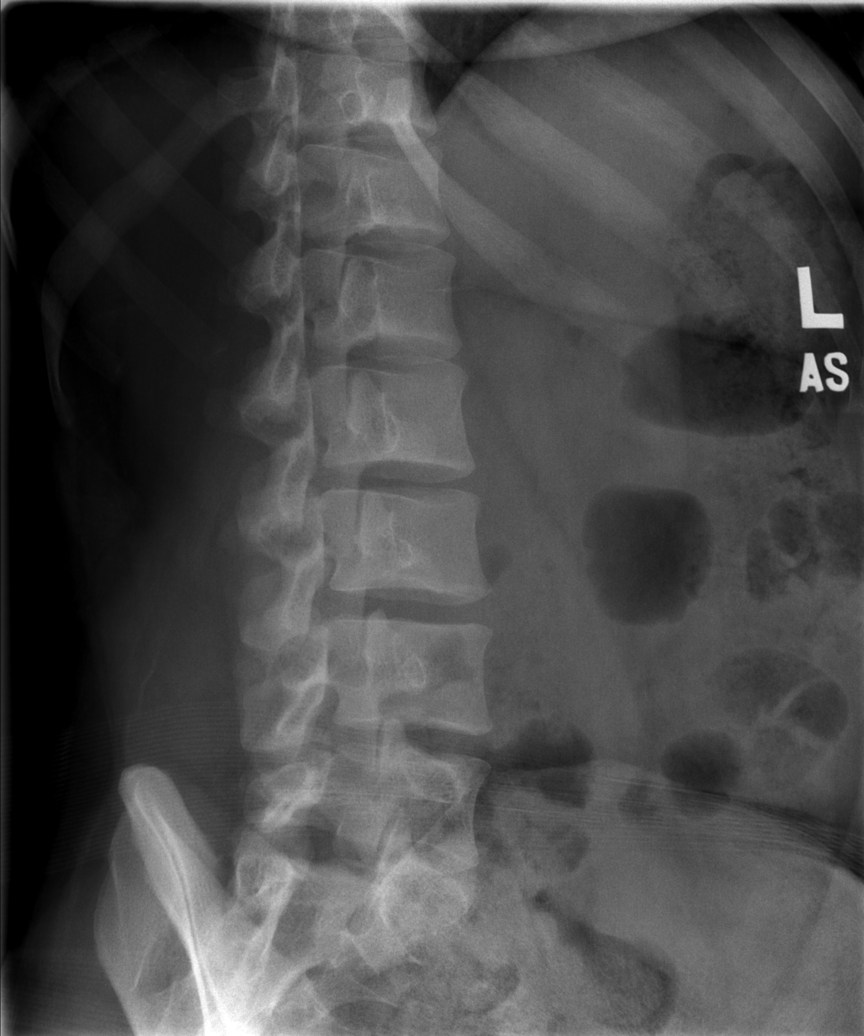

[t l-spine lat]
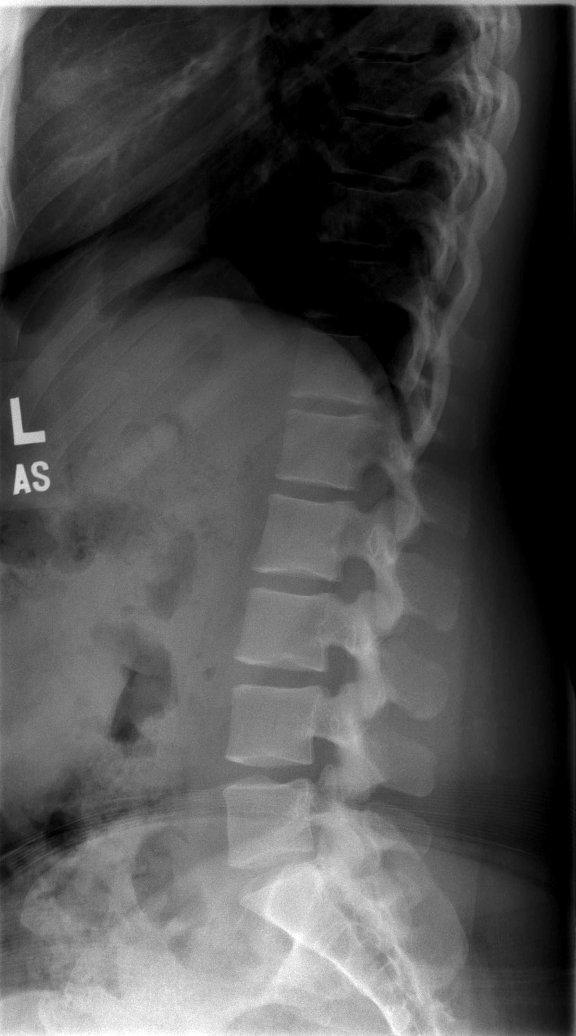

[t l-spine l5-s1 spot]
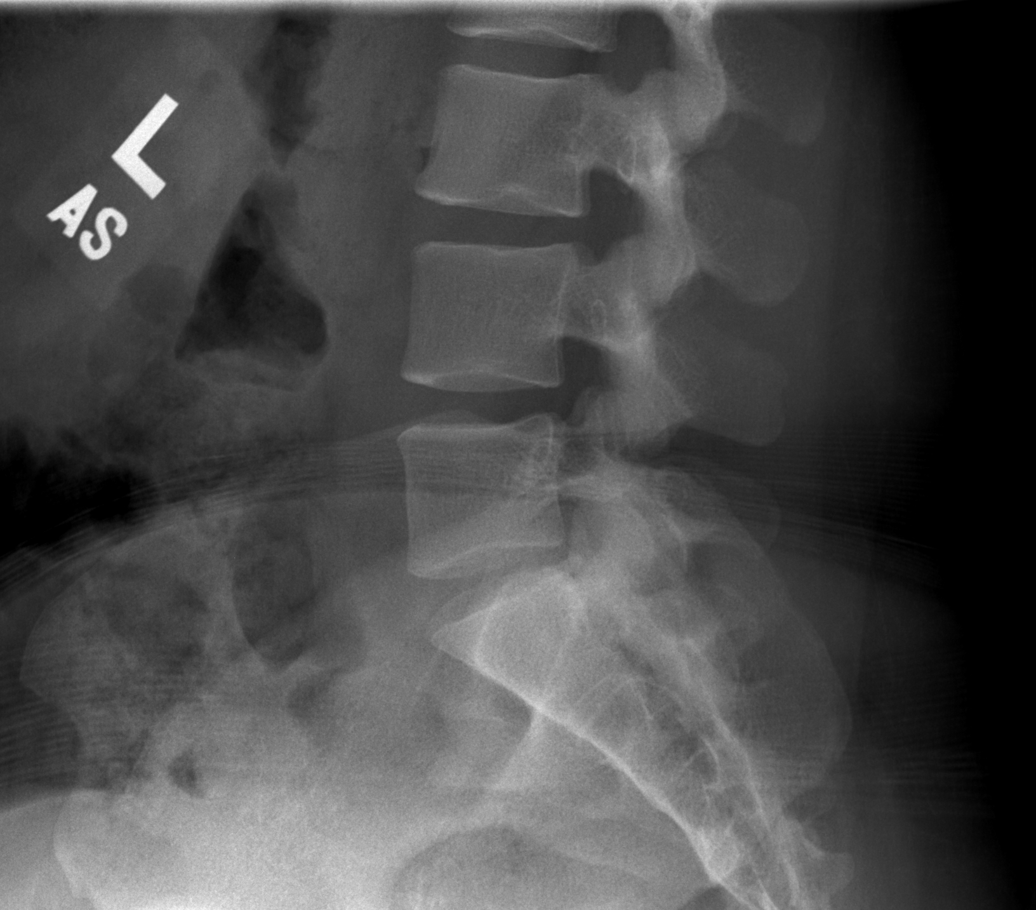

[5 of 5 positions shown; findings below may reference images not displayed]

FINDINGS: There is no evidence of lumbar spine fracture. Alignment is normal.
Intervertebral disc spaces are maintained.
IMPRESSION: Negative.

## 2014-10-28 DIAGNOSIS — Z79899 Other long term (current) drug therapy: Secondary | ICD-10-CM | POA: Diagnosis not present

## 2014-10-28 DIAGNOSIS — Z7952 Long term (current) use of systemic steroids: Secondary | ICD-10-CM | POA: Diagnosis not present

## 2014-10-28 DIAGNOSIS — J45909 Unspecified asthma, uncomplicated: Secondary | ICD-10-CM | POA: Insufficient documentation

## 2014-10-28 DIAGNOSIS — R109 Unspecified abdominal pain: Secondary | ICD-10-CM | POA: Diagnosis present

## 2014-10-28 DIAGNOSIS — B349 Viral infection, unspecified: Secondary | ICD-10-CM | POA: Diagnosis not present

## 2014-10-28 DIAGNOSIS — Z3202 Encounter for pregnancy test, result negative: Secondary | ICD-10-CM | POA: Diagnosis not present

## 2014-10-29 ENCOUNTER — Encounter (HOSPITAL_COMMUNITY): Payer: Self-pay | Admitting: *Deleted

## 2014-10-29 ENCOUNTER — Emergency Department (HOSPITAL_COMMUNITY)
Admission: EM | Admit: 2014-10-29 | Discharge: 2014-10-29 | Disposition: A | Discharge status: Home / Self Care | Payer: Managed Care, Other (non HMO) | Attending: Emergency Medicine | Admitting: Emergency Medicine

## 2014-10-29 DIAGNOSIS — B349 Viral infection, unspecified: Secondary | ICD-10-CM

## 2014-10-29 LAB — I-STAT CHEM 8, ED
BUN: 8 mg/dL (ref 6–23)
Calcium, Ion: 1.21 mmol/L (ref 1.12–1.23)
Chloride: 104 mmol/L (ref 96–112)
Creatinine, Ser: 0.7 mg/dL (ref 0.50–1.10)
Glucose, Bld: 101 mg/dL — ABNORMAL HIGH (ref 70–99)
HCT: 35 % — ABNORMAL LOW (ref 36.0–46.0)
Hemoglobin: 11.9 g/dL — ABNORMAL LOW (ref 12.0–15.0)
Potassium: 3.5 mmol/L (ref 3.5–5.1)
Sodium: 141 mmol/L (ref 135–145)
TCO2: 21 mmol/L (ref 0–100)

## 2014-10-29 LAB — POC URINE PREG, ED: Preg Test, Ur: NEGATIVE

## 2014-10-29 MED ORDER — IBUPROFEN 800 MG PO TABS
800.0000 mg | ORAL_TABLET | Freq: Once | ORAL | Status: AC
  Administered 2014-10-29: 800 mg via ORAL
  Filled 2014-10-29: qty 1

## 2014-10-29 MED ORDER — ONDANSETRON 4 MG PO TBDP: 4.0000 mg | ORAL_TABLET | Freq: Three times a day (TID) | ORAL | Status: DC | PRN

## 2014-10-29 MED ORDER — IBUPROFEN 800 MG PO TABS: 800.0000 mg | ORAL_TABLET | Freq: Three times a day (TID) | ORAL | Status: DC | PRN

## 2014-10-29 MED ORDER — ONDANSETRON 4 MG PO TBDP
4.0000 mg | ORAL_TABLET | Freq: Once | ORAL | Status: AC
  Administered 2014-10-29: 4 mg via ORAL
  Filled 2014-10-29: qty 1

## 2014-10-29 NOTE — ED Notes (Signed)
The pt is  C/o pain all over her body.  Headache nausea  abd pain since this am  lmpo 3 weeks ago

## 2014-10-29 NOTE — Discharge Instructions (Signed)

## 2014-10-29 NOTE — ED Provider Notes (Signed)
This chart was scribed for Vanessa MawKristen N Jalani Cullifer, DO by Abel PrestoKara Demonbreun, ED Scribe. This patient was seen in room A07C/A07C   TIME SEEN: 12:'28 AM  CHIEF COMPLAINT: Abdominal Pain  HPI: Vanessa Foster is a 29 y.o. female with a h/o asthma who presents to the Emergency Department with abdominal pain with onset 11 AM yesterday. Pt notes associated nausea, headache in back of head, chills, and generalized body aches mostly in legs and feet. Pt has not taken anything for relief. 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 Pt notes h/o anemia but denies prior history of blood transfusion. Pt is not utd with her flu vaccine. Pt denies fever, dyspnea, vomiting, diarrhea, abnormal vaginal discharge, vaginal bleeding, dysuria, and hematuria. States she was concerned that her "blood counts were low" because she has felt weak and tired.  ROS: See HPI Constitutional: no fever  Eyes: no drainage  ENT: no runny nose   Cardiovascular:  no chest pain  Resp: no SOB  GI: no vomiting GU: no dysuria Integumentary: no rash  Allergy: no hives  Musculoskeletal: no leg swelling  Neurological: no slurred speech ROS otherwise negative  PAST MEDICAL HISTORY/PAST SURGICAL HISTORY:  Past Medical History  Diagnosis Date  . Asthma     MEDICATIONS:  Prior to Admission medications   Medication Sig Start Date End Date Taking? Authorizing Provider  albuterol (PROVENTIL HFA;VENTOLIN HFA) 108 (90 BASE) MCG/ACT inhaler Inhale 2 puffs into the lungs every 6 (six) hours as needed for wheezing or shortness of breath.   Yes Historical Provider, MD  cyclobenzaprine (FLEXERIL) 10 MG tablet Take 1 tablet (10 mg total) by mouth 2 (two) times daily as needed for muscle spasms. 07/08/14  Yes Junius FinnerErin O'Malley, PA-C  ondansetron (ZOFRAN ODT) 4 MG disintegrating tablet Take 1 tablet (4 mg total) by mouth every 8 (eight) hours as needed for nausea or vomiting. 04/30/14  Yes Mercedes Strupp Camprubi-Soms, PA-C  polyethylene glycol (MIRALAX /  GLYCOLAX) packet Take 17 g by mouth daily. For constipation 04/30/14  Yes Mercedes Strupp Camprubi-Soms, PA-C  traMADol (ULTRAM) 50 MG tablet Take 1 tablet (50 mg total) by mouth every 6 (six) hours as needed. Patient taking differently: Take 50 mg by mouth every 6 (six) hours as needed for moderate pain.  07/08/14  Yes Junius FinnerErin O'Malley, PA-C  ferrous sulfate 325 (65 FE) MG tablet Take 1 tablet (325 mg total) by mouth daily. Patient not taking: Reported on 10/29/2014 04/30/14   Donnita FallsMercedes Strupp Camprubi-Soms, PA-C  fluconazole (DIFLUCAN) 150 MG tablet Take 1 tablet (150 mg total) by mouth once. TAKE THIS DOSE ON 05/03/14 Patient not taking: Reported on 10/29/2014 05/03/14   Donnita FallsMercedes Strupp Camprubi-Soms, PA-C  predniSONE (DELTASONE) 20 MG tablet 3 tabs po day one, then 2 po daily x 4 days Patient not taking: Reported on 10/29/2014 07/08/14   Junius FinnerErin O'Malley, PA-C  sulfamethoxazole-trimethoprim (BACTRIM DS) 800-160 MG per tablet Take 1 tablet by mouth 2 (two) times daily. For 7 days Patient not taking: Reported on 10/29/2014 04/30/14   Donnita FallsMercedes Strupp Camprubi-Soms, PA-C    ALLERGIES:  No Known Allergies  SOCIAL HISTORY:  History  Substance Use Topics  . Smoking status: Never Smoker   . Smokeless tobacco: Never Used  . Alcohol Use: No    FAMILY HISTORY: No family history on file.  EXAM: BP 129/75 mmHg  Pulse 87  Temp(Src) 98.1 F (36.7 C) (Oral)  Resp 16  SpO2 100%  LMP 10/08/2014 CONSTITUTIONAL: Alert and oriented and responds appropriately  to questions. Well-appearing; well-nourished, nontoxic-appearing, appears well-hydrated, in no distress HEAD: Normocephalic EYES: Conjunctivae clear, PERRL, no conjunctival pallor ENT: normal nose; no rhinorrhea; moist mucous membranes; pharynx without lesions noted, no tonsillar hypertrophy or exudate NECK: Supple, no meningismus, no LAD  CARD: RRR; S1 and S2 appreciated; no murmurs, no clicks, no rubs, no gallops RESP: Normal chest excursion without  splinting or tachypnea; breath sounds clear and equal bilaterally; no wheezes, no rhonchi, no rales, no hypoxia ABD/GI: Normal bowel sounds; non-distended; soft, non-tender, no rebound, no guarding BACK:  The back appears normal and is non-tender to palpation, there is no CVA tenderness EXT: Normal ROM in all joints; non-tender to palpation; no edema; normal capillary refill; no cyanosis    SKIN: Normal color for age and race; warm, no rash NEURO: Moves all extremities equally, sensation to light touch is intact; CN 2-12 intact PSYCH: The patient's mood and manner are appropriate. Grooming and personal hygiene are appropriate.  MEDICAL DECISION MAKING: Pt here with likely viral symptoms. She is very well-appearing, hemodynamically stable. Exam is nonfocal. No meningismus. Lungs are clear to auscultation. Reports she has felt weak and tired but suspect this may be secondary to viral symptoms but she states she does have a history of anemia. We'll check basic labs, urine pregnancy test. Will give ibuprofen, Zofran for headache and nausea and body aches. We'll by mouth challenge.  ED PROGRESS: Patient's urine pregnancy test is negative. Her sodium is 141, potassium 3.5, chloride 104, bicarbonate 21, glucose 101, creatinine 0.7, hemoglobin 11.9, hematocrit 35. This has been confirmed with mini lab. She is tolerating by mouth without difficulty. She still very well-appearing, hemodynamically stable. We'll discharge patient home with prescriptions for ibuprofen, Zofran. Discussed return precautions. She verbalized understanding and is comfortable with plan.      Vanessa Maw Kristina Mcnorton, DO 10/29/14 [phone]

## 2014-10-29 NOTE — ED Notes (Signed)
Pt A&OX4, ambulatory at d/c with steady gait, NAD 

## 2014-10-30 ENCOUNTER — Other Ambulatory Visit: Payer: Self-pay | Admitting: Family Medicine

## 2014-10-30 ENCOUNTER — Ambulatory Visit (INDEPENDENT_AMBULATORY_CARE_PROVIDER_SITE_OTHER): Payer: Managed Care, Other (non HMO) | Admitting: Medical

## 2014-10-30 ENCOUNTER — Telehealth: Payer: Self-pay | Admitting: Medical

## 2014-10-30 ENCOUNTER — Encounter: Payer: Self-pay | Admitting: Medical

## 2014-10-30 VITALS — BP 98/60 | HR 74 | Temp 98.3°F | Resp 15 | Ht 62.0 in | Wt 189.0 lb

## 2014-10-30 DIAGNOSIS — N83202 Unspecified ovarian cyst, left side: Secondary | ICD-10-CM

## 2014-10-30 DIAGNOSIS — R110 Nausea: Secondary | ICD-10-CM

## 2014-10-30 DIAGNOSIS — R519 Headache, unspecified: Secondary | ICD-10-CM

## 2014-10-30 DIAGNOSIS — Z01419 Encounter for gynecological examination (general) (routine) without abnormal findings: Secondary | ICD-10-CM

## 2014-10-30 DIAGNOSIS — R1032 Left lower quadrant pain: Secondary | ICD-10-CM

## 2014-10-30 DIAGNOSIS — R51 Headache: Secondary | ICD-10-CM

## 2014-10-30 DIAGNOSIS — Z975 Presence of (intrauterine) contraceptive device: Secondary | ICD-10-CM

## 2014-10-30 DIAGNOSIS — N8320 Unspecified ovarian cysts: Secondary | ICD-10-CM

## 2014-10-30 LAB — POCT URINALYSIS DIPSTICK
Bilirubin, UA: NEGATIVE
Blood, UA: NEGATIVE
Glucose, UA: NEGATIVE
Ketones, UA: NEGATIVE
Leukocytes, UA: NEGATIVE
Nitrite, UA: NEGATIVE
Protein, UA: NEGATIVE
Spec Grav, UA: 1.02
Urobilinogen, UA: NEGATIVE
pH, UA: 7

## 2014-10-30 NOTE — Telephone Encounter (Signed)
Patient is aware of Vanessa CoveyShane Tysinger PA message and she did agree to have the GYN appointment. Orders are placed in [facility] and they will call her for her appointment

## 2014-10-30 NOTE — Telephone Encounter (Signed)
Please remind her that the Nexplanon/Implanon does need to be removed.  Make sure she is okay for referral to gynecology.  I wanted to call back to make sure she is aware that the Implanon does need to be removed  Have her return soon for a general physical or recheck

## 2014-10-30 NOTE — Progress Notes (Signed)
Subjective: Here to establish primary care.  Was seeing different primary care prior.  She also is here for emergency department follow-up. She went to the emergency department yesterday for belly pain.  No specific diagnosis found, was prescribed ibuprofen and Zofran.  She had been having a few days of abdominal pain worse in the left lower area, nausea, headaches, chills, body aches, felt like she maybe had eaten something undercooked. However she denies vomiting, diarrhea.  Normally has a bowel movement every other day, does get constipation from time to time. No blood or mucus in the stool. She denies any urinary complaints or vaginal complaints. Last urinary tract infection 4-5 months ago. Last menstrual period 3 weeks ago but her periods are irregular.  She actually has Implanon in place past due to come out.  Last placement was 2010, long overdue to have this removed.  Hasn't had it taken out due to lack of insurance.  Her nausea is somewhat improved with Zofran. Pain is left lower quadrant steady not crampy.  She lives with her 27-year-old son and boyfriend of 5 years, no current concern for STD, no vaginal discharge. No fever no belly pain worse with eating, no back pain, no upper belly pain. Denies history of pancreatitis or kidney stones, not drinking any alcohol.  No CP, no SOB.  No trauma, no fall. He does have a history of ovarian cyst.    She also notes frequent headaches.  Been getting a lot of headaches.  She says she eats healthy, baking most foods.  Works at [employer].  Lives with boyfriend, and son 7yo.  Her and boyfriend x 5 years.  HAs every 2 days.  Just started wearing contacts.   Mostly frontal HAs.  Back of head hurts sometimes.  No allergy issues, no sinus issues.  Some sneezing.  She feels like the main trigger is probably stress, stresses out about everything was on medication for mood a few years ago.  No other aggravating or relieving factors. No other  complaint.  Objective: BP 98/60 mmHg  Pulse 74  Temp(Src) 98.3 F (36.8 C) (Oral)  Resp 15  Ht 5\' 2"  (1.575 m)  Wt 189 lb (85.73 kg)  BMI 34.56 kg/m2  LMP 10/08/2014  Gen: wd,wn,nad, AA female Skin: Warm and dry Heart: RRR, normal S1-S2, no murmurs Lungs clear Back nontender Neck supple nontender no thyromegaly or lymphadenopathy, normal range of motion HEENT negative  Oral: MMM, no lesions  Abdomen: +bs, soft, tender over left lower quadrant and suprapubic area right over the left ovary, otherwise nontender no mass no organomegaly Extremities no edema Gyn: declines Neuro: Nonfocal exam  Assessment: Encounter Diagnoses  Name Primary?  . Abdominal pain, left lower quadrant Yes  . Cyst of left ovary   . Nausea without vomiting   . Nexplanon in place   . Frequent headaches     Plan:  We discussed her concerns, I reviewed her emergency primary reports from yesterday as well as to emergency primary reports in the last year, reviewed pelvic ultrasound from July 2015 showing 5+ centimeter left ovary cyst.  We discussed a wide differential of lower abdominal pain.  Urinalysis today unremarkable.  Of note this past year on 2 separate emergency department visits she was diagnosed with UTI and pelvic inflammatory disease separately.  She is past due to have the Nexplanon removed and advised she let me know where she wants to be referred for this (gyn).  No obvious clinical suspicion for kidney  stone, diverticulitis, urinary tract infection, no symptoms suggestive of gastroenteritis or UTI.  I suspect a left ovarian cyst may be the issue, may have gotten bigger.  At this point she declines any further evaluation today.  Advise bland diet, small portions, rest, hydration, continue the ibuprofen and Zofran prescribed by the emergency department, and she will call back in a few days or return if not improving  Frequent headaches-discussed stress reduction, ways to deal with stress,  limiting caffeine, regular exercise, healthy diet, keep a headache diary and return in a few weeks

## 2014-10-30 NOTE — Addendum Note (Signed)
Addended by: Leretha DykesSCALES, Shepard Keltz L on: 10/30/2014 02:50 PM   Modules accepted: Orders

## 2014-12-20 ENCOUNTER — Encounter: Payer: Managed Care, Other (non HMO) | Admitting: Certified Nurse Midwife

## 2014-12-25 ENCOUNTER — Encounter: Payer: Self-pay | Admitting: Certified Nurse Midwife

## 2014-12-25 ENCOUNTER — Ambulatory Visit (INDEPENDENT_AMBULATORY_CARE_PROVIDER_SITE_OTHER): Payer: Managed Care, Other (non HMO) | Admitting: Certified Nurse Midwife

## 2014-12-25 VITALS — BP 90/60 | HR 80 | Temp 97.4°F | Resp 20 | Ht 62.75 in | Wt 190.0 lb

## 2014-12-25 DIAGNOSIS — Z124 Encounter for screening for malignant neoplasm of cervix: Secondary | ICD-10-CM

## 2014-12-25 DIAGNOSIS — Z0000 Encounter for general adult medical examination without abnormal findings: Secondary | ICD-10-CM

## 2014-12-25 DIAGNOSIS — Z01419 Encounter for gynecological examination (general) (routine) without abnormal findings: Secondary | ICD-10-CM | POA: Diagnosis not present

## 2014-12-25 DIAGNOSIS — Z308 Encounter for other contraceptive management: Secondary | ICD-10-CM | POA: Diagnosis not present

## 2014-12-25 DIAGNOSIS — Z30011 Encounter for initial prescription of contraceptive pills: Secondary | ICD-10-CM | POA: Diagnosis not present

## 2014-12-25 LAB — CBC
HCT: 37.9 % (ref 36.0–46.0)
Hemoglobin: 12.4 g/dL (ref 12.0–15.0)
MCH: 26.2 pg (ref 26.0–34.0)
MCHC: 32.7 g/dL (ref 30.0–36.0)
MCV: 80.1 fL (ref 78.0–100.0)
MPV: 9.5 fL (ref 8.6–12.4)
Platelets: 307 10*3/uL (ref 150–400)
RBC: 4.73 MIL/uL (ref 3.87–5.11)
RDW: 14.2 % (ref 11.5–15.5)
WBC: 9.4 10*3/uL (ref 4.0–10.5)

## 2014-12-25 LAB — POCT URINALYSIS DIPSTICK
Bilirubin, UA: NEGATIVE
Blood, UA: NEGATIVE
Glucose, UA: NEGATIVE
Ketones, UA: NEGATIVE
Leukocytes, UA: NEGATIVE
Nitrite, UA: NEGATIVE
Protein, UA: NEGATIVE
Urobilinogen, UA: NEGATIVE
pH, UA: 5

## 2014-12-25 MED ORDER — NORETHIN ACE-ETH ESTRAD-FE 1-20 MG-MCG PO TABS: 1.0000 | ORAL_TABLET | Freq: Every day | ORAL | Status: DC

## 2014-12-25 NOTE — Progress Notes (Signed)
29 y.o.  Single  African American Female g1p1001 here to establish gyn care and here  here for annual exam.  Periods are generally irregular in every 2 months or monthly. Duration 7- 10 days. Cramping controlled with OTC medication. Contraception none, has Implanon that was due for removal in  2013 and would like to have removed. Patient would like to start on OCP. History of migraine headaches no aura uses OTC with good results. No partner change, would like to do STD screening. Patient sees Urgent care if needed for asthma issues.   Patient's last menstrual period was 12/17/2014.          Sexually active: Yes.    The current method of family planning is none Has Implanon which needs removal inserted 2010.    Exercising: No.  exercise Smoker:  no  Health Maintenance: Pap:  2011 abnormal? No f/u because patient moved MMG:  none Colonoscopy:  none BMD:   none TDaP:  2014 Labs: Poct urine-neg Self breast exam: not done   reports that she has never smoked. She has never used smokeless tobacco. She reports that she does not drink alcohol or use illicit drugs.  Past Medical History  Diagnosis Date  . Asthma   . Abnormal Pap smear of cervix     2011?  . Migraines   . Anemia   . Anxiety   . Depression   . Dysmenorrhea   . Dyspareunia   . PID (pelvic inflammatory disease)     Past Surgical History  Procedure Laterality Date  . Cesarean section      Current Outpatient Prescriptions  Medication Sig Dispense Refill  . albuterol (PROVENTIL HFA;VENTOLIN HFA) 108 (90 BASE) MCG/ACT inhaler Inhale 2 puffs into the lungs every 6 (six) hours as needed for wheezing or shortness of breath.    . cyclobenzaprine (FLEXERIL) 10 MG tablet Take 1 tablet (10 mg total) by mouth 2 (two) times daily as needed for muscle spasms. 20 tablet 0  . ibuprofen (ADVIL,MOTRIN) 800 MG tablet Take 1 tablet (800 mg total) by mouth every 8 (eight) hours as needed for mild pain. 30 tablet 0  . polyethylene glycol  (MIRALAX / GLYCOLAX) packet Take 17 g by mouth daily. For constipation 14 each 0   No current facility-administered medications for this visit.    History reviewed. No pertinent family history.  ROS:  Pertinent items are noted in HPI.  Otherwise, a comprehensive ROS was negative.  Exam:   BP 90/60 mmHg  Pulse 80  Temp(Src) 97.4 F (36.3 C) (Oral)  Resp 20  Ht 5' 2.75" (1.594 m)  Wt 190 lb (86.183 kg)  BMI 33.92 kg/m2  LMP 12/17/2014 Height: 5' 2.75" (159.4 cm) Ht Readings from Last 3 Encounters:  12/25/14 5' 2.75" (1.594 m)  10/30/14 5\' 2"  (1.575 m)  07/08/14 5\' 2"  (1.575 m)    General appearance: alert, cooperative and appears stated age Head: Normocephalic, without obvious abnormality, atraumatic Neck: no adenopathy, supple, symmetrical, trachea midline and thyroid normal to inspection and palpation and non-palpable Lungs: clear to auscultation bilaterally Breasts: normal appearance, no masses or tenderness, No nipple retraction or dimpling, No nipple discharge or bleeding, No axillary or supraclavicular adenopathy Heart: regular rate and rhythm Abdomen: soft, non-tender; no masses,  no organomegaly Extremities: extremities normal, atraumatic, no cyanosis or edema  Implanon palpated in left arm intact Skin: Skin color, texture, turgor normal. No rashes or lesions Lymph nodes: Cervical, supraclavicular, and axillary nodes normal. No abnormal inguinal nodes  palpated Neurologic: Grossly normal   Pelvic: External genitalia:  no lesions              Urethra:  normal appearing urethra with no masses, tenderness or lesions              Bartholin's and Skene's: normal                 Vagina: normal appearing vagina with normal color and discharge, no lesions              Cervix: normal, non tender, no lesions              Pap taken: Yes.   Bimanual Exam:  Uterus:  normal size, contour, position, consistency, mobility, non-tender              Adnexa: normal adnexa and no mass,  fullness, tenderness               Rectovaginal: Confirms               Anus:  normal   Chaperone present: Yes  A:  Well Woman with normal exam  Contraception none at this point, Has Implanon that needs removal was due 2013 for removal would like to use OCP's  STD screening desired  History of abnormal pap no follow up  History of Migraine headache, no aura  History of PID in 2015, no STD idenitified   P:   Reviewed health and wellness pertinent to exam  Discussed risks and benefits of OCP, patient would like to start on OCP. Discussed this will also give her better cycle control. Will start with next menses.  Rx Junel 1/20 Fe with instruction  Discussed with patient she will be called with insurance information and then scheduled for removal of Implanon  Labs: GC, Chlamydia, HIV,RPR, CBC  Pap smear taken today with HPV reflex  Reviewed health and wellness pertinent to exam. Discussed importance of regular exercise, calcium intake, STD protection.  return annually or prn, as above  An After Visit Summary was printed and given to the patient.

## 2014-12-25 NOTE — Patient Instructions (Signed)
General topics  Next pap or exam is  due in 1 year Take a Women's multivitamin Take 1200 mg. of calcium daily - prefer dietary If any concerns in interim to call back  Breast Self-Awareness Practicing breast self-awareness may pick up problems early, prevent significant medical complications, and possibly save your life. By practicing breast self-awareness, you can become familiar with how your breasts look and feel and if your breasts are changing. This allows you to notice changes early. It can also offer you some reassurance that your breast health is good. One way to learn what is normal for your breasts and whether your breasts are changing is to do a breast self-exam. If you find a lump or something that was not present in the past, it is best to contact your caregiver right away. Other findings that should be evaluated by your caregiver include nipple discharge, especially if it is bloody; skin changes or reddening; areas where the skin seems to be pulled in (retracted); or new lumps and bumps. Breast pain is seldom associated with cancer (malignancy), but should also be evaluated by a caregiver. BREAST SELF-EXAM The best time to examine your breasts is 5 7 days after your menstrual period is over.  ExitCare Patient Information 2013 [employer].   Exercise to Stay Healthy Exercise helps you become and stay healthy. EXERCISE IDEAS AND TIPS Choose exercises that:  You enjoy.  Fit into your day. You do not need to exercise really hard to be healthy. You can do exercises at a slow or medium level and stay healthy. You can:  Stretch before and after working out.  Try yoga, Pilates, or tai chi.  Lift weights.  Walk fast, swim, jog, run, climb stairs, bicycle, dance, or rollerskate.  Take aerobic classes. Exercises that burn about 150 calories:  Running 1  miles in 15 minutes.  Playing volleyball for 45 to 60 minutes.  Washing and waxing a car for 45 to 60  minutes.  Playing touch football for 45 minutes.  Walking 1  miles in 35 minutes.  Pushing a stroller 1  miles in 30 minutes.  Playing basketball for 30 minutes.  Raking leaves for 30 minutes.  Bicycling 5 miles in 30 minutes.  Walking 2 miles in 30 minutes.  Dancing for 30 minutes.  Shoveling snow for 15 minutes.  Swimming laps for 20 minutes.  Walking up stairs for 15 minutes.  Bicycling 4 miles in 15 minutes.  Gardening for 30 to 45 minutes.  Jumping rope for 15 minutes.  Washing windows or floors for 45 to 60 minutes. Document Released: 10/10/2010 Document Revised: 11/30/2011 Document Reviewed: 10/10/2010 ExitCare Patient Information 2013 [employer].   Other topics ( that may be useful information):    Sexually Transmitted Disease Sexually transmitted disease (STD) refers to any infection that is passed from person to person during sexual activity. This may happen by way of saliva, semen, blood, vaginal mucus, or urine. Common STDs include:  Gonorrhea.  Chlamydia.  Syphilis.  HIV/AIDS.  Genital herpes.  Hepatitis B and C.  Trichomonas.  Human papillomavirus (HPV).  Pubic lice. CAUSES  An STD may be spread by bacteria, virus, or parasite. A person can get an STD by:  Sexual intercourse with an infected person.  Sharing sex toys with an infected person.  Sharing needles with an infected person.  Having intimate contact with the genitals, mouth, or rectal areas of an infected person. SYMPTOMS  Some people may not have any symptoms, but   they can still pass the infection to others. Different STDs have different symptoms. Symptoms include:  Painful or bloody urination.  Pain in the pelvis, abdomen, vagina, anus, throat, or eyes.  Skin rash, itching, irritation, growths, or sores (lesions). These usually occur in the genital or anal area.  Abnormal vaginal discharge.  Penile discharge in men.  Soft, flesh-colored skin growths in the  genital or anal area.  Fever.  Pain or bleeding during sexual intercourse.  Swollen glands in the groin area.  Yellow skin and eyes (jaundice). This is seen with hepatitis. DIAGNOSIS  To make a diagnosis, your caregiver may:  Take a medical history.  Perform a physical exam.  Take a specimen (culture) to be examined.  Examine a sample of discharge under a microscope.  Perform blood test TREATMENT   Chlamydia, gonorrhea, trichomonas, and syphilis can be cured with antibiotic medicine.  Genital herpes, hepatitis, and HIV can be treated, but not cured, with prescribed medicines. The medicines will lessen the symptoms.  Genital warts from HPV can be treated with medicine or by freezing, burning (electrocautery), or surgery. Warts may come back.  HPV is a virus and cannot be cured with medicine or surgery.However, abnormal areas may be followed very closely by your caregiver and may be removed from the cervix, vagina, or vulva through office procedures or surgery. If your diagnosis is confirmed, your recent sexual partners need treatment. This is true even if they are symptom-free or have a negative culture or evaluation. They should not have sex until their caregiver says it is okay. HOME CARE INSTRUCTIONS  All sexual partners should be informed, tested, and treated for all STDs.  Take your antibiotics as directed. Finish them even if you start to feel better.  Only take over-the-counter or prescription medicines for pain, discomfort, or fever as directed by your caregiver.  Rest.  Eat a balanced diet and drink enough fluids to keep your urine clear or pale yellow.  Do not have sex until treatment is completed and you have followed up with your caregiver. STDs should be checked after treatment.  Keep all follow-up appointments, Pap tests, and blood tests as directed by your caregiver.  Only use latex condoms and water-soluble lubricants during sexual activity. Do not use  petroleum jelly or oils.  Avoid alcohol and illegal drugs.  Get vaccinated for HPV and hepatitis. If you have not received these vaccines in the past, talk to your caregiver about whether one or both might be right for you.  Avoid risky sex practices that can break the skin. The only way to avoid getting an STD is to avoid all sexual activity.Latex condoms and dental dams (for oral sex) will help lessen the risk of getting an STD, but will not completely eliminate the risk. SEEK MEDICAL CARE IF:   You have a fever.  You have any new or worsening symptoms. Document Released: 11/28/2002 Document Revised: 11/30/2011 Document Reviewed: 12/05/2010 [facility] Patient Information 2013 [place].    Domestic Abuse You are being battered or abused if someone close to you hits, pushes, or physically hurts you in any way. You also are being abused if you are forced into activities. You are being sexually abused if you are forced to have sexual contact of any kind. You are being emotionally abused if you are made to feel worthless or if you are constantly threatened. It is important to remember that help is available. No one has the right to abuse you. PREVENTION OF FURTHER  ABUSE  Learn the warning signs of danger. This varies with situations but may include: the use of alcohol, threats, isolation from friends and family, or forced sexual contact. Leave if you feel that violence is going to occur.  If you are attacked or beaten, report it to the police so the abuse is documented. You do not have to press charges. The police can protect you while you or the attackers are leaving. Get the officer's name and badge number and a copy of the report.  Find someone you can trust and tell them what is happening to you: your caregiver, a nurse, clergy member, close friend or family member. Feeling ashamed is natural, but remember that you have done nothing wrong. No one deserves abuse. Document Released:  09/04/2000 Document Revised: 11/30/2011 Document Reviewed: 11/13/2010 ExitCare Patient Information 2013 [employer]Care[employer].    How Much is Too Much Alcohol? Drinking too much alcohol can cause injury, accidents, and health problems. These types of problems can include:   Car crashes.  Falls.  Family fighting (domestic violence).  Drowning.  Fights.  Injuries.  Burns.  Damage to certain organs.  Having a baby with birth defects. ONE DRINK CAN BE TOO MUCH WHEN YOU ARE:  Working.  Pregnant or breastfeeding.  Taking medicines. Ask your doctor.  Driving or planning to drive. If you or someone you know has a drinking problem, get help from a doctor.  Document Released: 07/04/2009 Document Revised: 11/30/2011 Document Reviewed: 07/04/2009 ExitCare Patient Information 2013 ExitCare[employer].   Smoking Hazards Smoking cigarettes is extremely bad for your health. Tobacco smoke has over 200 known poisons in it. There are over 60 chemicals in tobacco smoke that cause cancer. Some of the chemicals found in cigarette smoke include:   Cyanide.  Benzene.  Formaldehyde.  Methanol (wood alcohol).  Acetylene (fuel used in welding torches).  Ammonia. Cigarette smoke also contains the poisonous gases nitrogen oxide and carbon monoxide.  Cigarette smokers have an increased risk of many serious medical problems and Smoking causes approximately:  90% of all lung cancer deaths in men.  80% of all lung cancer deaths in women.  90% of deaths from chronic obstructive lung disease. Compared with nonsmokers, smoking increases the risk of:  Coronary heart disease by 2 to 4 times.  Stroke by 2 to 4 times.  Men developing lung cancer by 23 times.  Women developing lung cancer by 13 times.  Dying from chronic obstructive lung diseases by 12 times.  . Smoking is the most preventable cause of death and disease in our society.  WHY IS SMOKING ADDICTIVE?  Nicotine is the chemical  agent in tobacco that is capable of causing addiction or dependence.  When you smoke and inhale, nicotine is absorbed rapidly into the bloodstream through your lungs. Nicotine absorbed through the lungs is capable of creating a powerful addiction. Both inhaled and non-inhaled nicotine may be addictive.  Addiction studies of cigarettes and spit tobacco show that addiction to nicotine occurs mainly during the teen years, when young people begin using tobacco products. WHAT ARE THE BENEFITS OF QUITTING?  There are many health benefits to quitting smoking.   Likelihood of developing cancer and heart disease decreases. Health improvements are seen almost immediately.  Blood pressure, pulse rate, and breathing patterns start returning to normal soon after quitting. QUITTING SMOKING   American Lung Association - 1-800-LUNGUSA  American Cancer Society - 1-800-ACS-2345 Document Released: 10/15/2004 Document Revised: 11/30/2011 Document Reviewed: 06/19/2009 ExitCare Patient Information 2013 ExitCare,   LLC.   Stress Management Stress is a state of physical or mental tension that often results from changes in your life or normal routine. Some common causes of stress are:  Death of a loved one.  Injuries or severe illnesses.  Getting fired or changing jobs.  Moving into a new home. Other causes may be:  Sexual problems.  Business or financial losses.  Taking on a large debt.  Regular conflict with someone at home or at work.  Constant tiredness from lack of sleep. It is not just bad things that are stressful. It may be stressful to:  Win the lottery.  Get married.  Buy a new car. The amount of stress that can be easily tolerated varies from person to person. Changes generally cause stress, regardless of the types of change. Too much stress can affect your health. It may lead to physical or emotional problems. Too little stress (boredom) may also become stressful. SUGGESTIONS TO  REDUCE STRESS:  Talk things over with your family and friends. It often is helpful to share your concerns and worries. If you feel your problem is serious, you may want to get help from a professional counselor.  Consider your problems one at a time instead of lumping them all together. Trying to take care of everything at once may seem impossible. List all the things you need to do and then start with the most important one. Set a goal to accomplish 2 or 3 things each day. If you expect to do too many in a single day you will naturally fail, causing you to feel even more stressed.  Do not use alcohol or drugs to relieve stress. Although you may feel better for a short time, they do not remove the problems that caused the stress. They can also be habit forming.  Exercise regularly - at least 3 times per week. Physical exercise can help to relieve that "uptight" feeling and will relax you.  The shortest distance between despair and hope is often a good night's sleep.  Go to bed and get up on time allowing yourself time for appointments without being rushed.  Take a short "time-out" period from any stressful situation that occurs during the day. Close your eyes and take some deep breaths. Starting with the muscles in your face, tense them, hold it for a few seconds, then relax. Repeat this with the muscles in your neck, shoulders, hand, stomach, back and legs.  Take good care of yourself. Eat a balanced diet and get plenty of rest.  Schedule time for having fun. Take a break from your daily routine to relax. HOME CARE INSTRUCTIONS   Call if you feel overwhelmed by your problems and feel you can no longer manage them on your own.  Return immediately if you feel like hurting yourself or someone else. Document Released: 03/03/2001 Document Revised: 11/30/2011 Document Reviewed: 10/24/2007 [facility] Patient Information 2013 [place].  Oral Contraception Use Oral contraceptive pills (OCPs)  are medicines taken to prevent pregnancy. OCPs work by preventing the ovaries from releasing eggs. The hormones in OCPs also cause the cervical mucus to thicken, preventing the sperm from entering the uterus. The hormones also cause the uterine lining to become thin, not allowing a fertilized egg to attach to the inside of the uterus. OCPs are highly effective when taken exactly as prescribed. However, OCPs do not prevent sexually transmitted diseases (STDs). Safe sex practices, such as using condoms along with an OCP, can help prevent STDs. Before  taking OCPs, you may have a physical exam and Pap test. Your health care provider may also order blood tests if necessary. Your health care provider will make sure you are a good candidate for oral contraception. Discuss with your health care provider the possible side effects of the OCP you may be prescribed. When starting an OCP, it can take 2 to 3 months for the body to adjust to the changes in hormone levels in your body.  HOW TO TAKE ORAL CONTRACEPTIVE PILLS Your health care provider may advise you on how to start taking the first cycle of OCPs. Otherwise, you can:   Start on day 1 of your menstrual period. You will not need any backup contraceptive protection with this start time.   Start on the first Sunday after your menstrual period or the day you get your prescription. In these cases, you will need to use backup contraceptive protection for the first week.   Start the pill at any time of your cycle. If you take the pill within 5 days of the start of your period, you are protected against pregnancy right away. In this case, you will not need a backup form of birth control. If you start at any other time of your menstrual cycle, you will need to use another form of birth control for 7 days. If your OCP is the type called a minipill, it will protect you from pregnancy after taking it for 2 days (48 hours). After you have started taking OCPs:   If you  forget to take 1 pill, take it as soon as you remember. Take the next pill at the regular time.   If you miss 2 or more pills, call your health care provider because different pills have different instructions for missed doses. Use backup birth control until your next menstrual period starts.   If you use a 28-day pack that contains inactive pills and you miss 1 of the last 7 pills (pills with no hormones), it will not matter. Throw away the rest of the non-hormone pills and start a new pill pack.  No matter which day you start the OCP, you will always start a new pack on that same day of the week. Have an extra pack of OCPs and a backup contraceptive method available in case you miss some pills or lose your OCP pack.  HOME CARE INSTRUCTIONS   Do not smoke.   Always use a condom to protect against STDs. OCPs do not protect against STDs.   Use a calendar to mark your menstrual period days.   Read the information and directions that came with your OCP. Talk to your health care provider if you have questions.  SEEK MEDICAL CARE IF:   You develop nausea and vomiting.   You have abnormal vaginal discharge or bleeding.   You develop a rash.   You miss your menstrual period.   You are losing your hair.   You need treatment for mood swings or depression.   You get dizzy when taking the OCP.   You develop acne from taking the OCP.   You become pregnant.  SEEK IMMEDIATE MEDICAL CARE IF:   You develop chest pain.   You develop shortness of breath.   You have an uncontrolled or severe headache.   You develop numbness or slurred speech.   You develop visual problems.   You develop pain, redness, and swelling in the legs.  Document Released: 08/27/2011 Document Revised: 01/22/2014 Document Reviewed:  02/26/2013 ExitCare Patient Information 2015 [place], [place]. This information is not intended to replace advice given to you by your health care provider. Make  sure you discuss any questions you have with your health care provider.

## 2014-12-26 ENCOUNTER — Telehealth: Payer: Self-pay | Admitting: Certified Nurse Midwife

## 2014-12-26 LAB — RPR

## 2014-12-26 LAB — HIV ANTIBODY (ROUTINE TESTING W REFLEX): HIV 1&2 Ab, 4th Generation: NONREACTIVE

## 2014-12-26 LAB — IPS PAP TEST WITH REFLEX TO HPV

## 2014-12-26 NOTE — Telephone Encounter (Signed)
Left message for patient to call back. Need to go over benefit for IUD removal.

## 2014-12-26 NOTE — Telephone Encounter (Signed)
Spoke with patient. Advised of benefit quote received for IUD removal.  Patient agreeable. Scheduled procedure.

## 2014-12-26 NOTE — Telephone Encounter (Signed)
Pt returning call

## 2014-12-27 ENCOUNTER — Encounter: Payer: Self-pay | Admitting: Certified Nurse Midwife

## 2014-12-27 ENCOUNTER — Ambulatory Visit (INDEPENDENT_AMBULATORY_CARE_PROVIDER_SITE_OTHER): Payer: Managed Care, Other (non HMO) | Admitting: Certified Nurse Midwife

## 2014-12-27 DIAGNOSIS — Z308 Encounter for other contraceptive management: Secondary | ICD-10-CM | POA: Diagnosis not present

## 2014-12-27 LAB — IPS N GONORRHOEA AND CHLAMYDIA BY PCR

## 2014-12-27 NOTE — Progress Notes (Signed)
28 yrs African American Single female [id number]  presents for Implanon removal.   LMP3/28/16. Patient has been using condoms due to Implanon should have been removed in 2013.      Contraception: Patient has Rx for OCP from aex to start at next menses. Questions addressed.  Consent signed. Requests same as above.  HPI neg.  Healthy female WDWN Recent aex 12/25/14 normal  Procedure: Patient placed supine on exam table with herleft arm   flexed at the elbow and externally rotated.The insertion site was identified on the inner side of upper arm.The device was palpated. Incision site for removal was marked. The removal site was cleansed with betadine solution and 1 ml of 1% Lidocaine was injected at the incision site. A small 2 mm incision was made at the distal end of the implant.The Implanon implant was grasped with curved forceps and removed gently intact.The implant was shown to the patient and discarded. The incision was closed with a steri strip.  No active bleeding was noted. A small adhesive bandage placed over the removal site.  A pressure bandage was place over the site.  Assessment:  Implanon  Removal Pt tolerated procedure well. Planned contraception:The current method of family planning is OCP (estrogen/progesterone) to start with next menses.  Plan :Instructions and warning signs and symptoms given.  Remove bandage in 3 days. Questions addressed.   Return Visit prn

## 2014-12-27 NOTE — Progress Notes (Signed)
Reviewed personally.  M. Suzanne Cher Franzoni, MD.  

## 2014-12-27 NOTE — Addendum Note (Signed)
Addended by: Verner CholLEONARD, Avionna Bower S on: 12/27/2014 08:26 AM   Modules accepted: Kipp BroodSmartSet

## 2015-01-06 NOTE — Progress Notes (Signed)
Reviewed personally.  M. Suzanne Rawley Harju, MD.  

## 2015-01-09 ENCOUNTER — Ambulatory Visit (INDEPENDENT_AMBULATORY_CARE_PROVIDER_SITE_OTHER): Payer: Managed Care, Other (non HMO) | Admitting: Medical

## 2015-01-09 ENCOUNTER — Encounter: Payer: Self-pay | Admitting: Medical

## 2015-01-09 VITALS — BP 122/82 | HR 81 | Temp 97.8°F | Wt 189.4 lb

## 2015-01-09 DIAGNOSIS — R1030 Lower abdominal pain, unspecified: Secondary | ICD-10-CM

## 2015-01-09 DIAGNOSIS — G4489 Other headache syndrome: Secondary | ICD-10-CM | POA: Diagnosis not present

## 2015-01-09 DIAGNOSIS — R300 Dysuria: Secondary | ICD-10-CM

## 2015-01-09 DIAGNOSIS — J4521 Mild intermittent asthma with (acute) exacerbation: Secondary | ICD-10-CM

## 2015-01-09 DIAGNOSIS — J309 Allergic rhinitis, unspecified: Secondary | ICD-10-CM

## 2015-01-09 LAB — POCT URINALYSIS DIPSTICK
Bilirubin, UA: NEGATIVE
Glucose, UA: NEGATIVE
Ketones, UA: NEGATIVE
Nitrite, UA: NEGATIVE
Spec Grav, UA: 1.025
Urobilinogen, UA: NEGATIVE
pH, UA: 6

## 2015-01-09 LAB — POCT URINE PREGNANCY: Preg Test, Ur: NEGATIVE

## 2015-01-09 MED ORDER — IBUPROFEN 800 MG PO TABS: 800.0000 mg | ORAL_TABLET | Freq: Three times a day (TID) | ORAL | Status: DC | PRN

## 2015-01-09 MED ORDER — ALBUTEROL SULFATE HFA 108 (90 BASE) MCG/ACT IN AERS: 2.0000 | INHALATION_SPRAY | Freq: Four times a day (QID) | RESPIRATORY_TRACT | Status: DC | PRN

## 2015-01-09 MED ORDER — SULFAMETHOXAZOLE-TRIMETHOPRIM 800-160 MG PO TABS
1.0000 | ORAL_TABLET | Freq: Two times a day (BID) | ORAL | Status: DC
Start: 2015-01-09 — End: 2015-03-29

## 2015-01-09 MED ORDER — BECLOMETHASONE DIPROPIONATE 80 MCG/ACT NA AERS: 1.0000 | INHALATION_SPRAY | Freq: Two times a day (BID) | NASAL | Status: DC

## 2015-01-09 NOTE — Patient Instructions (Signed)
Allergies:  Take a shower nightly to wash off pollen  Continue OTC night time antihistamine such as zyrtec, allegra, benadryl  STOP mucinex  Begin Qnasal spray, 1 spray per nostril 1-2 times daily  Short term to open up the nose you can use Afrin, but no more than 3 days in a row at a time  Consider nasal saline to clean out nostrils daily  Being bactrim for urinary tract infection

## 2015-01-09 NOTE — Progress Notes (Signed)
Subjective: Here for possible UTI.  She notes 4 day hx/o discomfort with urination, urinary frequency, urgency, but then just little amounts of urine at a time.   Burns at end of urine.   No urine odor, but is darker than normal.   Got some Azo OTC, but not helping.   Drinking plenty of water.  She notes no back pain, but has some lower abdominal discomfort.   No vaginal discharge.   Last UTI 04/2014.  Married, no concern for STD.  Just was gyn recently, had normal STD last week .  Had nexplanon removed about a week ago.   No current OCP.    Possible sinus infection or cold or allergies.   Has had symptoms for 3-4 weeks.   Has head congestion, sneezing, nose runny.   Had mild sore throat last 2 days.  Has had some ear pain, R>L.  No cough, no fever.  Has nausea but thinks it related to being on so much medication she has tried for head symptoms.  Has felt some wheezing, used inhaler a few times.   No itchy eyes.  No sinus pain.  Typically does get spring allergies.  Using mucinex, benadryl.  When she uses the nose spray, seems to sneeze more.   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  Has used flonase prior without relief.  ROS as in subjective  Past Medical History  Diagnosis Date  . Asthma   . Abnormal Pap smear of cervix     2011?  . Migraines   . Anemia   . Anxiety   . Depression   . Dysmenorrhea   . Dyspareunia   . PID (pelvic inflammatory disease)    Past Surgical History  Procedure Laterality Date  . Cesarean section    . Implanon      inserted 2010    Objective: BP 122/82 mmHg  Pulse 81  Temp(Src) 97.8 F (36.6 C) (Oral)  Wt 189 lb 6.4 oz (85.911 kg)  LMP 12/17/2014  General appearance: alert, no distress, WD/WN HEENT: normocephalic, sclerae anicteric, TMs pearly, nares with clear discharge, turbinates boggy, no erythema, pharynx normal Oral cavity: MMM, no lesions Neck: supple, no lymphadenopathy, no thyromegaly, no masses Heart: RRR, normal S1, S2, no  murmurs Lungs: CTA bilaterally, no wheezes, rhonchi, or rales Abdomen: +bs, soft, tender RLQ and suprapubic, otherwise non tender, non distended, no masses, no hepatomegaly, no splenomegaly Pulses: 2+ symmetric, upper and lower extremities, normal cap refill    Assessment: Encounter Diagnoses  Name Primary?  . Allergic rhinitis, unspecified allergic rhinitis type Yes  . Asthma with acute exacerbation, mild intermittent   . Dysuria   . Lower abdominal pain   . Other headache syndrome    Plan: Allergies - begin Qnasal spray, c/t anthismiane, daily showers to wash off pollen in evenings, nasal saline, and discussed prn short term use of afrin when just cant breath throughout nose Asthma - c/t albuterol prn, but if not improving in the next week, then cal back Dysuria, abdominal pain - begin bactrim for likely UTI.  Rest, hydrate well.  headaches - if not improving in 1-2 wk with better allergy symptoms control, then recheck.   Patient Instructions  Allergies:  Take a shower nightly to wash off pollen  Continue OTC night time antihistamine such as zyrtec, allegra, benadryl  STOP mucinex  Begin Qnasal spray, 1 spray per nostril 1-2 times daily  Short term to open up the nose you can use  Afrin, but no more than 3 days in a row at a time  Consider nasal saline to clean out nostrils daily  Being bactrim for urinary tract infection

## 2015-01-19 ENCOUNTER — Encounter (HOSPITAL_COMMUNITY): Payer: Self-pay | Admitting: *Deleted

## 2015-01-19 ENCOUNTER — Emergency Department (HOSPITAL_COMMUNITY): Payer: Managed Care, Other (non HMO)

## 2015-01-19 ENCOUNTER — Emergency Department (HOSPITAL_COMMUNITY)
Admission: EM | Admit: 2015-01-19 | Discharge: 2015-01-19 | Disposition: A | Discharge status: Home / Self Care | Payer: Managed Care, Other (non HMO) | Attending: Emergency Medicine | Admitting: Emergency Medicine

## 2015-01-19 DIAGNOSIS — Z8742 Personal history of other diseases of the female genital tract: Secondary | ICD-10-CM | POA: Diagnosis not present

## 2015-01-19 DIAGNOSIS — J45909 Unspecified asthma, uncomplicated: Secondary | ICD-10-CM | POA: Diagnosis present

## 2015-01-19 DIAGNOSIS — Z79899 Other long term (current) drug therapy: Secondary | ICD-10-CM | POA: Diagnosis not present

## 2015-01-19 DIAGNOSIS — Z7951 Long term (current) use of inhaled steroids: Secondary | ICD-10-CM | POA: Insufficient documentation

## 2015-01-19 DIAGNOSIS — Z792 Long term (current) use of antibiotics: Secondary | ICD-10-CM | POA: Diagnosis not present

## 2015-01-19 DIAGNOSIS — J45901 Unspecified asthma with (acute) exacerbation: Secondary | ICD-10-CM | POA: Diagnosis not present

## 2015-01-19 DIAGNOSIS — Z8659 Personal history of other mental and behavioral disorders: Secondary | ICD-10-CM | POA: Insufficient documentation

## 2015-01-19 DIAGNOSIS — Z862 Personal history of diseases of the blood and blood-forming organs and certain disorders involving the immune mechanism: Secondary | ICD-10-CM | POA: Diagnosis not present

## 2015-01-19 DIAGNOSIS — G43909 Migraine, unspecified, not intractable, without status migrainosus: Secondary | ICD-10-CM | POA: Diagnosis not present

## 2015-01-19 MED ORDER — PREDNISONE 20 MG PO TABS: 40.0000 mg | ORAL_TABLET | Freq: Every day | ORAL | Status: DC

## 2015-01-19 NOTE — ED Notes (Signed)
Pt states that she has had to use her inhaler since yesterday for her asthma. Pt reports little relief. No wheezing at triage however pt reports using inhaler about 30 minutes ago. .Marland Kitchen

## 2015-01-19 NOTE — ED Notes (Signed)
Declined W/C at D/C and was escorted to lobby by RN. 

## 2015-01-19 NOTE — ED Provider Notes (Signed)
CSN: [id number]     Arrival date & time 01/19/15  1121 History  This chart was scribed for Teressa LowerVrinda Heike Pounds, NP, working with Benjiman CoreNathan Dorell Gatlin, MD by Elon SpannerGarrett Cook, ED Scribe. This patient was seen in room TR08C/TR08C and the patient's care was started at 11:51 AM.  Chief Complaint  Patient presents with  . Asthma   The history is provided by the patient. No language interpreter was used.   HPI Comments: Vanessa Foster is a 29 y.o. female with a history of asthma who presents to the Emergency Department complaining of an intermittent dry cough onset two days ago that she attributes to her asthma.  She typically uses her albuterol inhaler for episodes similar to today's complaint but has not found relief.  Patient was last prescribed steroids for her asthma > 6 months ago during her last visit to the ED.  Patient denies fever.  NKA.  Past Medical History  Diagnosis Date  . Asthma   . Abnormal Pap smear of cervix     2011?  . Migraines   . Anemia   . Anxiety   . Depression   . Dysmenorrhea   . Dyspareunia   . PID (pelvic inflammatory disease)    Past Surgical History  Procedure Laterality Date  . Cesarean section    . Implanon      inserted 2010   Family History  Problem Relation Age of Onset  . Hypertension Mother   . Heart disease Mother   . Diabetes Paternal Grandmother   . Stroke Paternal Grandmother    History  Substance Use Topics  . Smoking status: Never Smoker   . Smokeless tobacco: Never Used  . Alcohol Use: No   OB History    Gravida Para Term Preterm AB TAB SAB Ectopic Multiple Living   1 1 1       1      Review of Systems  Respiratory: Positive for cough and shortness of breath.   All other systems reviewed and are negative.     Allergies  Review of patient's allergies indicates no known allergies.  Home Medications   Prior to Admission medications   Medication Sig Start Date End Date Taking? Authorizing Provider  albuterol (PROVENTIL  HFA;VENTOLIN HFA) 108 (90 BASE) MCG/ACT inhaler Inhale 2 puffs into the lungs every 6 (six) hours as needed for wheezing or shortness of breath. 01/09/15   Kermit Baloavid S Tysinger, PA-C  Beclomethasone Dipropionate 80 MCG/ACT AERS Place 1 spray into the nose 2 (two) times daily. 01/09/15   Kermit Baloavid S Tysinger, PA-C  cyclobenzaprine (FLEXERIL) 10 MG tablet Take 1 tablet (10 mg total) by mouth 2 (two) times daily as needed for muscle spasms. 07/08/14   Junius FinnerErin O'Malley, PA-C  ibuprofen (ADVIL,MOTRIN) 800 MG tablet Take 1 tablet (800 mg total) by mouth every 8 (eight) hours as needed for mild pain. 01/09/15   Kermit Baloavid S Tysinger, PA-C  norethindrone-ethinyl estradiol (JUNEL FE,GILDESS FE,LOESTRIN FE) 1-20 MG-MCG tablet Take 1 tablet by mouth daily. Patient not taking: Reported on 01/09/2015 12/25/14   Verner Choleborah S Leonard, CNM  polyethylene glycol [facility](MIRALAX / Ethelene HalGLYCOLAX) packet Take 17 g by mouth daily. For constipation 04/30/14   Mercedes Camprubi-Soms, PA-C  sulfamethoxazole-trimethoprim (BACTRIM DS,SEPTRA DS) 800-160 MG per tablet Take 1 tablet by mouth 2 (two) times daily. 01/09/15   Kermit Baloavid S Tysinger, PA-C   BP 114/93 mmHg  Pulse 100  Temp(Src) 97.6 F (36.4 C) (Oral)  Resp 18  Ht 5\' 2"  (1.575 m)  Wt  189 lb (85.73 kg)  BMI 34.56 kg/m2  SpO2 98%  LMP 12/17/2014 Physical Exam  Constitutional: She is oriented to person, place, and time. She appears well-developed and well-nourished.  HENT:  Left Ear: External ear normal.  Nose: Rhinorrhea present.  Cardiovascular: Normal rate and regular rhythm.   Pulmonary/Chest: Effort normal and breath sounds normal. She has no wheezes.  Musculoskeletal: Normal range of motion.  Neurological: She is alert and oriented to person, place, and time.  Skin: Skin is warm and dry.  Nursing note and vitals reviewed.   ED Course  Procedures (including critical care time)  DIAGNOSTIC STUDIES: Oxygen Saturation is 98% on RA, normal by my interpretation.    COORDINATION OF  CARE:  11:59 AM Discussed treatment plan with patient at bedside.  Patient acknowledges and agrees with plan.    Labs Review Labs Reviewed - No data to display  Imaging Review Dg Chest 2 View  01/19/2015   CLINICAL DATA:  Chest discomfort and wheezing for 2 days. Fever and nausea. Asthma.  EXAM: CHEST  2 VIEW  COMPARISON:  01/01/2011  FINDINGS: The heart size and mediastinal contours are within normal limits. Both lungs are clear. The visualized skeletal structures are unremarkable.  IMPRESSION: Negative.  No active cardiopulmonary disease.   Electronically Signed   By: Myles Rosenthal M.D.   On: 01/19/2015 13:26     EKG Interpretation None      MDM   Final diagnoses:  Asthma exacerbation    Pt likely having asthma exacerbation due to allergies. X-ray negative. Will send home on prednisone. Pt has inhaler  I personally performed the services described in this documentation, which was scribed in my presence. The recorded information has been reviewed and is accurate.   Teressa Lower, NP 01/19/15 1333  Benjiman Core, MD 01/20/15 [phone]

## 2015-01-19 NOTE — Discharge Instructions (Signed)

## 2015-03-29 ENCOUNTER — Ambulatory Visit (INDEPENDENT_AMBULATORY_CARE_PROVIDER_SITE_OTHER): Payer: Managed Care, Other (non HMO) | Admitting: Medical

## 2015-03-29 ENCOUNTER — Encounter: Payer: Self-pay | Admitting: Medical

## 2015-03-29 VITALS — BP 110/70 | HR 86 | Temp 97.9°F | Resp 15 | Wt 188.0 lb

## 2015-03-29 DIAGNOSIS — E669 Obesity, unspecified: Secondary | ICD-10-CM | POA: Diagnosis not present

## 2015-03-29 DIAGNOSIS — B372 Candidiasis of skin and nail: Secondary | ICD-10-CM

## 2015-03-29 DIAGNOSIS — B379 Candidiasis, unspecified: Secondary | ICD-10-CM

## 2015-03-29 DIAGNOSIS — L732 Hidradenitis suppurativa: Secondary | ICD-10-CM | POA: Diagnosis not present

## 2015-03-29 MED ORDER — NYSTATIN 100000 UNIT/GM EX POWD: Freq: Two times a day (BID) | CUTANEOUS | Status: DC

## 2015-03-29 MED ORDER — DOXYCYCLINE HYCLATE 100 MG PO TABS: ORAL_TABLET | ORAL | Status: DC

## 2015-03-29 NOTE — Progress Notes (Addendum)
Subjective: Got boil over 2 weeks ago, right armpit.   Nurse practitioner at her office [facility]) did I&D, got lots of pus out.  Was put on antibiotic, Doxycycline for 3 days.  No culture sent.   It closed up and got worse again.   Has gotten boils in the past.  Use to get boils every 2-3 weeks.  Lately every 6 weeks gets a boil.    Has red tender area under right breast x 2 weeks.   This is the firs time she has had this.  Using some Vaseline.  No hx/o HIV, MRSA or diabetes.    Obesity - no diet discretion, drinks soda daily, exercises 1-2 days per week.   No other aggravating or relieving factors.   No other complaint.  Past Medical History  Diagnosis Date  . Asthma   . Abnormal Pap smear of cervix     2011?  . Migraines   . Anemia   . Anxiety   . Depression   . Dysmenorrhea   . Dyspareunia   . PID (pelvic inflammatory disease)    ROS as in subjective   Objective: BP 110/70 mmHg  Pulse 86  Temp(Src) 97.9 F (36.6 C) (Oral)  Resp 15  Wt 188 lb (85.276 kg)  Gen: wd, wn, nad Pink red patches under bilat breast, worse R>L, several tender nodules superficially under right axilla, 2 areas of prior scar for I&D, old scarring under left axilla but no current tender nodules, no current fluctuance erythema     Assessment: Encounter Diagnoses  Name Primary?  . Hidradenitis suppurativa Yes  . Yeast infection   . Intertriginous candidiasis   . Obesity     Plan: Begin back on doxycycline BID x 10 days then daily until she runs out, use good hygiene.     Begin nystatin powder 1-2 times daily under each breast for mild candida intertrigo, avoid sweaty damp areas under breasts.  Obesity - discussed diet, exercise, weight loss measures.  HgbA1C 5.7 % today.

## 2015-06-28 ENCOUNTER — Encounter: Payer: Self-pay | Admitting: Medical

## 2015-06-28 ENCOUNTER — Ambulatory Visit (INDEPENDENT_AMBULATORY_CARE_PROVIDER_SITE_OTHER): Payer: Managed Care, Other (non HMO) | Admitting: Medical

## 2015-06-28 VITALS — BP 100/60 | HR 68 | Temp 98.0°F | Wt 190.0 lb

## 2015-06-28 DIAGNOSIS — L989 Disorder of the skin and subcutaneous tissue, unspecified: Secondary | ICD-10-CM

## 2015-06-28 DIAGNOSIS — T148 Other injury of unspecified body region: Secondary | ICD-10-CM | POA: Diagnosis not present

## 2015-06-28 DIAGNOSIS — T148XXA Other injury of unspecified body region, initial encounter: Secondary | ICD-10-CM

## 2015-06-28 LAB — CBC
HCT: 38.7 % (ref 36.0–46.0)
Hemoglobin: 12.3 g/dL (ref 12.0–15.0)
MCH: 25.5 pg — ABNORMAL LOW (ref 26.0–34.0)
MCHC: 31.8 g/dL (ref 30.0–36.0)
MCV: 80.1 fL (ref 78.0–100.0)
MPV: 9.7 fL (ref 8.6–12.4)
Platelets: 274 10*3/uL (ref 150–400)
RBC: 4.83 MIL/uL (ref 3.87–5.11)
RDW: 15.1 % (ref 11.5–15.5)
WBC: 7.9 10*3/uL (ref 4.0–10.5)

## 2015-06-28 MED ORDER — TRIAMCINOLONE ACETONIDE 0.1 % EX CREA: 1.0000 "application " | TOPICAL_CREAM | Freq: Two times a day (BID) | CUTANEOUS | Status: DC

## 2015-06-28 NOTE — Progress Notes (Signed)
Subjective: Chief Complaint  Patient presents with  . spot on leg    been on her left leg for over 8 months, originally thought it was a bruise. not sure if it is growing but it is starting to hurt.no other concerns.   She reports a bruise on her left lower leg she first noted 8 months ago.  She doesn't recall a particular bruise.  The bruise or coloration has remained basically unchanged.   She denies any other bruising or bleeding, no night sweats, fevers, weight loss, lymph nodes swelling or other.  No other skin changes or changing moles.  No other aggravating or relieving factors. No other complaint.  Past Medical History  Diagnosis Date  . Asthma   . Abnormal Pap smear of cervix     2011?  . Migraines   . Anemia   . Anxiety   . Depression   . Dysmenorrhea   . Dyspareunia   . PID (pelvic inflammatory disease)    ROS as in subjective   Objective: BP 100/60 mmHg  Pulse 68  Temp(Src) 98 F (36.7 C)  Wt 190 lb (86.183 kg)  LMP 06/01/2015  Gen: wd, wn, nad Normal pedal pulses, normal cap refill No extremity edema MSK: ankles, legs, feet, nontender and no swelling Skin: left lower leg with brownish coloration somewhat in the shape of a cross, faint.  No distinct border, no multiple colors, no warmth, no underlying edema, induration, fluctuation  Assessment: Encounter Diagnoses  Name Primary?  . Abnormality, skin Yes  . Bruise     Plan: I suspect skin tattooing from possible prior bruise, but she reports no injury or trauma 8 mo ago.  Etiology unclear,but likely benign.    She does have some discomfort/pain of the leg from time to time.   Offered Tib/fib xray, punch biopsy, but she declines.   Will get CBC to make sure no obvious abnormality.  The other option going forward is dermatology referral.  F/u pending CBC.  Begin triamcinolone cream to see if this makes any improvement.

## 2015-07-10 ENCOUNTER — Telehealth: Payer: Self-pay | Admitting: Medical

## 2015-07-10 NOTE — Telephone Encounter (Signed)
Called and left message to call back to see about rescheduling appt to a earlier time

## 2015-07-11 ENCOUNTER — Encounter: Payer: Self-pay | Admitting: Medical

## 2015-07-11 ENCOUNTER — Telehealth: Payer: Self-pay | Admitting: Medical

## 2015-07-11 ENCOUNTER — Ambulatory Visit (INDEPENDENT_AMBULATORY_CARE_PROVIDER_SITE_OTHER): Payer: Managed Care, Other (non HMO) | Admitting: Medical

## 2015-07-11 ENCOUNTER — Encounter: Payer: Managed Care, Other (non HMO) | Admitting: Medical

## 2015-07-11 VITALS — BP 120/84 | HR 80 | Ht 63.0 in | Wt 191.0 lb

## 2015-07-11 DIAGNOSIS — F329 Major depressive disorder, single episode, unspecified: Secondary | ICD-10-CM

## 2015-07-11 DIAGNOSIS — Z139 Encounter for screening, unspecified: Secondary | ICD-10-CM | POA: Diagnosis not present

## 2015-07-11 DIAGNOSIS — Z23 Encounter for immunization: Secondary | ICD-10-CM | POA: Insufficient documentation

## 2015-07-11 DIAGNOSIS — G4710 Hypersomnia, unspecified: Secondary | ICD-10-CM | POA: Diagnosis not present

## 2015-07-11 DIAGNOSIS — R110 Nausea: Secondary | ICD-10-CM | POA: Diagnosis not present

## 2015-07-11 DIAGNOSIS — Z01419 Encounter for gynecological examination (general) (routine) without abnormal findings: Secondary | ICD-10-CM | POA: Insufficient documentation

## 2015-07-11 DIAGNOSIS — R400 Somnolence: Secondary | ICD-10-CM | POA: Insufficient documentation

## 2015-07-11 DIAGNOSIS — J4520 Mild intermittent asthma, uncomplicated: Secondary | ICD-10-CM | POA: Diagnosis not present

## 2015-07-11 DIAGNOSIS — R319 Hematuria, unspecified: Secondary | ICD-10-CM

## 2015-07-11 DIAGNOSIS — Z0000 Encounter for general adult medical examination without abnormal findings: Secondary | ICD-10-CM | POA: Diagnosis not present

## 2015-07-11 DIAGNOSIS — F4323 Adjustment disorder with mixed anxiety and depressed mood: Secondary | ICD-10-CM | POA: Insufficient documentation

## 2015-07-11 DIAGNOSIS — G8929 Other chronic pain: Secondary | ICD-10-CM

## 2015-07-11 DIAGNOSIS — R51 Headache: Secondary | ICD-10-CM | POA: Diagnosis not present

## 2015-07-11 DIAGNOSIS — R519 Headache, unspecified: Secondary | ICD-10-CM | POA: Insufficient documentation

## 2015-07-11 DIAGNOSIS — G479 Sleep disorder, unspecified: Secondary | ICD-10-CM | POA: Insufficient documentation

## 2015-07-11 DIAGNOSIS — R4589 Other symptoms and signs involving emotional state: Secondary | ICD-10-CM | POA: Insufficient documentation

## 2015-07-11 DIAGNOSIS — L989 Disorder of the skin and subcutaneous tissue, unspecified: Secondary | ICD-10-CM | POA: Diagnosis not present

## 2015-07-11 LAB — COMPREHENSIVE METABOLIC PANEL
ALT: 14 U/L (ref 6–29)
AST: 17 U/L (ref 10–30)
Albumin: 4.1 g/dL (ref 3.6–5.1)
Alkaline Phosphatase: 49 U/L (ref 33–115)
BUN: 13 mg/dL (ref 7–25)
CO2: 27 mmol/L (ref 20–31)
Calcium: 9.8 mg/dL (ref 8.6–10.2)
Chloride: 102 mmol/L (ref 98–110)
Creat: 0.6 mg/dL (ref 0.50–1.10)
Glucose, Bld: 85 mg/dL (ref 65–99)
Potassium: 4.2 mmol/L (ref 3.5–5.3)
Sodium: 137 mmol/L (ref 135–146)
Total Bilirubin: 0.8 mg/dL (ref 0.2–1.2)
Total Protein: 7.7 g/dL (ref 6.1–8.1)

## 2015-07-11 LAB — LIPID PANEL
Cholesterol: 151 mg/dL (ref 125–200)
HDL: 35 mg/dL — ABNORMAL LOW (ref >=46)
LDL Cholesterol: 95 mg/dL (ref <130)
Total CHOL/HDL Ratio: 4.3 Ratio (ref <=5.0)
Triglycerides: 103 mg/dL (ref <150)
VLDL: 21 mg/dL (ref <30)

## 2015-07-11 LAB — POCT URINALYSIS DIPSTICK
Bilirubin, UA: NEGATIVE
Glucose, UA: NEGATIVE
Ketones, UA: NEGATIVE
Leukocytes, UA: NEGATIVE
Nitrite, UA: NEGATIVE
Spec Grav, UA: >=1.03
Urobilinogen, UA: NEGATIVE
pH, UA: 5.5

## 2015-07-11 MED ORDER — CITALOPRAM HYDROBROMIDE 20 MG PO TABS: 20.0000 mg | ORAL_TABLET | Freq: Every day | ORAL | Status: DC

## 2015-07-11 MED ORDER — ONDANSETRON HCL 4 MG PO TABS: 4.0000 mg | ORAL_TABLET | Freq: Three times a day (TID) | ORAL | Status: DC | PRN
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
## 2015-07-11 NOTE — Telephone Encounter (Signed)
Called and left message to try to get pt to come in early for a appt told pt to call back

## 2015-07-12 LAB — POCT UA - MICROSCOPIC ONLY
Bacteria, U Microscopic: NEGATIVE
Casts, Ur, LPF, POC: NEGATIVE
Crystals, Ur, HPF, POC: NEGATIVE
Epithelial cells, urine per micros: NEGATIVE
Mucus, UA: NEGATIVE
RBC, urine, microscopic: POSITIVE
WBC, Ur, HPF, POC: NEGATIVE
Yeast, UA: NEGATIVE

## 2015-07-12 LAB — TSH: TSH: 1.016 u[IU]/mL (ref 0.350–4.500)

## 2015-07-12 LAB — HEPATITIS B SURFACE ANTIBODY, QUANTITATIVE: Hepatitis B-Post: 8.1 m[IU]/mL

## 2015-07-15 ENCOUNTER — Encounter: Payer: Self-pay | Admitting: Medical

## 2015-07-15 ENCOUNTER — Ambulatory Visit (INDEPENDENT_AMBULATORY_CARE_PROVIDER_SITE_OTHER): Payer: Managed Care, Other (non HMO) | Admitting: Medical

## 2015-07-15 VITALS — BP 118/88 | HR 82 | Wt 194.0 lb

## 2015-07-15 DIAGNOSIS — L858 Other specified epidermal thickening: Secondary | ICD-10-CM | POA: Diagnosis not present

## 2015-07-15 DIAGNOSIS — L989 Disorder of the skin and subcutaneous tissue, unspecified: Secondary | ICD-10-CM

## 2015-07-15 DIAGNOSIS — R300 Dysuria: Secondary | ICD-10-CM

## 2015-07-15 LAB — POCT URINALYSIS DIPSTICK
Bilirubin, UA: NEGATIVE
Glucose, UA: NEGATIVE
Ketones, UA: NEGATIVE
Leukocytes, UA: NEGATIVE
Nitrite, UA: NEGATIVE
Spec Grav, UA: >=1.03
Urobilinogen, UA: 0.2
pH, UA: 6

## 2015-07-15 NOTE — Progress Notes (Signed)
Subjective: Here for excisional biopsy of left facial lesion.  We discuss this recently on her physical.   Has horn like projection under left eye that has gotten longer in last few months, but the lesion in general has been there over a year.    Objective: BP 118/88 mmHg  Pulse 82  Wt 194 lb (87.998 kg)  SpO2 98%  LMP 07/01/2015  Left face just inferior and slightly lateral to orbit, superior cheek area with horn like thickened projection with 2mm base   Assessment: Encounter Diagnoses  Name Primary?  . Cutaneous horn Yes  . Changing skin lesion     Plan: Discussed skin findings.   Patient desires excision.  Discussed risks and benefits of shave biopsy.  Cleaned and prepped left upper cheek/inferior to orbit in usual sterile fashion, used 1% lidocaine without epinephrine for local anesthesia.   Used #15 blade scalpel and tissue forceps to remove the lesion.  Used hyphercater to achieve hemostasis.  Cleaned the wound area. Covered with bacitracin antibiotic.   discussed wound care.  Pt tolerated procedure well.  <2 cc EBL.   specimen sent to pathology.  We will call with pathology results.

## 2015-07-15 NOTE — Addendum Note (Signed)
Addended by: Kieth BrightlyLAWSON, Polette Nofsinger M on: 07/15/2015 05:02 PM   Modules accepted: Orders

## 2015-07-17 ENCOUNTER — Telehealth: Payer: Self-pay | Admitting: Medical

## 2015-07-17 ENCOUNTER — Other Ambulatory Visit: Payer: Self-pay | Admitting: Medical

## 2015-07-17 MED ORDER — CIPROFLOXACIN HCL 250 MG PO TABS: 250.0000 mg | ORAL_TABLET | Freq: Two times a day (BID) | ORAL | Status: DC

## 2015-07-17 NOTE — Telephone Encounter (Signed)
Please let her know that the pathology report shows the lesion was a wart.  No other worrisome findings.

## 2015-07-17 NOTE — Telephone Encounter (Signed)
LMTCB

## 2015-07-17 NOTE — Telephone Encounter (Signed)
Pt came into the office and I let her know then the results.

## 2015-07-19 ENCOUNTER — Ambulatory Visit: Payer: Managed Care, Other (non HMO) | Admitting: Medical

## 2015-07-23 ENCOUNTER — Encounter: Payer: Self-pay | Admitting: Medical

## 2015-08-06 ENCOUNTER — Ambulatory Visit: Payer: Managed Care, Other (non HMO) | Admitting: Medical

## 2015-08-06 ENCOUNTER — Telehealth: Payer: Self-pay

## 2015-08-06 NOTE — Telephone Encounter (Signed)
D

## 2015-08-06 NOTE — Telephone Encounter (Signed)
This patient no showed for their appointment today.Which of the following is necessary for this patient.   A) No follow-up necessary   B) Follow-up urgent. Locate Patient Immediately.   C) Follow-up necessary. Contact patient and Schedule visit in ____ Days.   D) Follow-up Advised. Contact patient and Schedule visit in ____ Days. 

## 2015-08-09 ENCOUNTER — Encounter: Payer: Self-pay | Admitting: Medical

## 2015-08-09 NOTE — Telephone Encounter (Signed)
Letter mailed

## 2015-12-23 ENCOUNTER — Emergency Department (HOSPITAL_COMMUNITY)
Admission: EM | Admit: 2015-12-23 | Discharge: 2015-12-23 | Disposition: A | Discharge status: Home / Self Care | Payer: Self-pay | Attending: Emergency Medicine | Admitting: Emergency Medicine

## 2015-12-23 ENCOUNTER — Emergency Department (HOSPITAL_COMMUNITY): Payer: Self-pay

## 2015-12-23 ENCOUNTER — Encounter (HOSPITAL_COMMUNITY): Payer: Self-pay | Admitting: *Deleted

## 2015-12-23 DIAGNOSIS — R112 Nausea with vomiting, unspecified: Secondary | ICD-10-CM | POA: Insufficient documentation

## 2015-12-23 DIAGNOSIS — Z8742 Personal history of other diseases of the female genital tract: Secondary | ICD-10-CM | POA: Insufficient documentation

## 2015-12-23 DIAGNOSIS — R197 Diarrhea, unspecified: Secondary | ICD-10-CM | POA: Insufficient documentation

## 2015-12-23 DIAGNOSIS — D649 Anemia, unspecified: Secondary | ICD-10-CM | POA: Insufficient documentation

## 2015-12-23 DIAGNOSIS — Z8659 Personal history of other mental and behavioral disorders: Secondary | ICD-10-CM | POA: Insufficient documentation

## 2015-12-23 DIAGNOSIS — R1031 Right lower quadrant pain: Secondary | ICD-10-CM | POA: Insufficient documentation

## 2015-12-23 DIAGNOSIS — J45909 Unspecified asthma, uncomplicated: Secondary | ICD-10-CM | POA: Insufficient documentation

## 2015-12-23 DIAGNOSIS — L02412 Cutaneous abscess of left axilla: Secondary | ICD-10-CM

## 2015-12-23 DIAGNOSIS — Z8679 Personal history of other diseases of the circulatory system: Secondary | ICD-10-CM | POA: Insufficient documentation

## 2015-12-23 DIAGNOSIS — R51 Headache: Secondary | ICD-10-CM | POA: Insufficient documentation

## 2015-12-23 DIAGNOSIS — L02411 Cutaneous abscess of right axilla: Secondary | ICD-10-CM

## 2015-12-23 DIAGNOSIS — E119 Type 2 diabetes mellitus without complications: Secondary | ICD-10-CM | POA: Insufficient documentation

## 2015-12-23 LAB — BASIC METABOLIC PANEL
Anion gap: 8 (ref 5–15)
BUN: 7 mg/dL (ref 6–20)
CO2: 26 mmol/L (ref 22–32)
Calcium: 9.2 mg/dL (ref 8.9–10.3)
Chloride: 105 mmol/L (ref 101–111)
Creatinine, Ser: 0.7 mg/dL (ref 0.44–1.00)
GFR calc Af Amer: >60 mL/min (ref >60)
GFR calc non Af Amer: >60 mL/min (ref >60)
Glucose, Bld: 88 mg/dL (ref 65–99)
Potassium: 3.6 mmol/L (ref 3.5–5.1)
Sodium: 139 mmol/L (ref 135–145)

## 2015-12-23 LAB — URINALYSIS, ROUTINE W REFLEX MICROSCOPIC
Bilirubin Urine: NEGATIVE
Glucose, UA: NEGATIVE mg/dL
Ketones, ur: NEGATIVE mg/dL
Leukocytes, UA: NEGATIVE
Nitrite: NEGATIVE
Protein, ur: 30 mg/dL — AB
Specific Gravity, Urine: 1.029 (ref 1.005–1.030)
pH: 6 (ref 5.0–8.0)

## 2015-12-23 LAB — CBC WITH DIFFERENTIAL/PLATELET
Basophils Absolute: 0 10*3/uL (ref 0.0–0.1)
Basophils Relative: 0 %
Eosinophils Absolute: 0.2 10*3/uL (ref 0.0–0.7)
Eosinophils Relative: 2 %
HCT: 34.4 % — ABNORMAL LOW (ref 36.0–46.0)
Hemoglobin: 11.1 g/dL — ABNORMAL LOW (ref 12.0–15.0)
Lymphocytes Relative: 22 %
Lymphs Abs: 2.8 10*3/uL (ref 0.7–4.0)
MCH: 25.8 pg — ABNORMAL LOW (ref 26.0–34.0)
MCHC: 32.3 g/dL (ref 30.0–36.0)
MCV: 79.8 fL (ref 78.0–100.0)
Monocytes Absolute: 1.1 10*3/uL — ABNORMAL HIGH (ref 0.1–1.0)
Monocytes Relative: 8 %
Neutro Abs: 9 10*3/uL — ABNORMAL HIGH (ref 1.7–7.7)
Neutrophils Relative %: 68 %
Platelets: 260 10*3/uL (ref 150–400)
RBC: 4.31 MIL/uL (ref 3.87–5.11)
RDW: 14.2 % (ref 11.5–15.5)
WBC: 13.1 10*3/uL — ABNORMAL HIGH (ref 4.0–10.5)

## 2015-12-23 LAB — URINE MICROSCOPIC-ADD ON

## 2015-12-23 LAB — POC URINE PREG, ED: Preg Test, Ur: NEGATIVE

## 2015-12-23 MED ORDER — ONDANSETRON HCL 4 MG PO TABS: 4.0000 mg | ORAL_TABLET | Freq: Four times a day (QID) | ORAL | Status: DC

## 2015-12-23 MED ORDER — IOPAMIDOL (ISOVUE-300) INJECTION 61%
INTRAVENOUS | Status: AC
  Filled 2015-12-23: qty 100

## 2015-12-23 MED ORDER — SULFAMETHOXAZOLE-TRIMETHOPRIM 800-160 MG PO TABS: 1.0000 | ORAL_TABLET | Freq: Two times a day (BID) | ORAL | Status: AC

## 2015-12-23 MED ORDER — MORPHINE SULFATE (PF) 4 MG/ML IV SOLN
4.0000 mg | Freq: Once | INTRAVENOUS | Status: AC
  Administered 2015-12-23: 4 mg via INTRAVENOUS
  Filled 2015-12-23: qty 1

## 2015-12-23 MED ORDER — HYDROCODONE-ACETAMINOPHEN 5-325 MG PO TABS: 2.0000 | ORAL_TABLET | ORAL | Status: DC | PRN

## 2015-12-23 MED ORDER — HYDROCODONE-ACETAMINOPHEN 5-325 MG PO TABS
1.0000 | ORAL_TABLET | Freq: Once | ORAL | Status: DC
Start: 2015-12-23 — End: 2015-12-23

## 2015-12-23 MED ORDER — IOPAMIDOL (ISOVUE-300) INJECTION 61%
100.0000 mL | Freq: Once | INTRAVENOUS | Status: AC | PRN
  Administered 2015-12-23: 100 mL via INTRAVENOUS

## 2015-12-23 MED ORDER — ONDANSETRON 4 MG PO TBDP
4.0000 mg | ORAL_TABLET | Freq: Once | ORAL | Status: AC
  Administered 2015-12-23: 4 mg via ORAL
  Filled 2015-12-23: qty 1

## 2015-12-23 MED ORDER — HYDROCODONE-ACETAMINOPHEN 5-325 MG PO TABS
1.0000 | ORAL_TABLET | Freq: Once | ORAL | Status: AC
  Administered 2015-12-23: 1 via ORAL
  Filled 2015-12-23: qty 1

## 2015-12-23 MED ORDER — LIDOCAINE-EPINEPHRINE (PF) 2 %-1:200000 IJ SOLN
10.0000 mL | Freq: Once | INTRAMUSCULAR | Status: AC
  Administered 2015-12-23: 10 mL
  Filled 2015-12-23: qty 20

## 2015-12-23 NOTE — ED Notes (Signed)
Pt reports having abscess under both arms, more severe on left side. Reports having headache, fever/chills and nausea.

## 2015-12-23 NOTE — ED Provider Notes (Signed)
CSN: [id number]     Arrival date & time 12/23/15  1119 History   First MD Initiated Contact with Patient 12/23/15 1421     Chief Complaint  Patient presents with  . Abscess     (Consider location/radiation/quality/duration/timing/severity/associated sxs/prior Treatment) HPI Comments: Patient is a 30 year old female with past medical history of axillary abscess who presents today with bilateral axillary abscesses with associated fever, chills, headache, nausea, vomiting, and abdominal pain. Patient reports the right abscess began one week ago in the left abscess began Wednesday or Thursday of last week. Patient reports the left is bigger and more painful than the right, and past abscesses. The right has been draining pus and blood, but the left has not been draining. Patient reports she had diffuse, lower abdominal pain Friday and Saturday with associated diarrhea. She reported her abdomen feeling like she had food poisoning. Patient had one episode of vomiting yesterday. She has been taking Tylenol for fever with good control, but it has not helped her pain. Patient has also tried warm compresses and Prid cream. Patient currently on her period, which is normal. No abnormal vaginal discharge. No urinary symptoms.  Patient is a 30 y.o. female presenting with abscess. The history is provided by the patient.  Abscess Associated symptoms: fever, headaches, nausea and vomiting     Past Medical History  Diagnosis Date  . Abnormal Pap smear of cervix     2011?  . Migraines   . Anemia   . Anxiety   . Depression   . Dysmenorrhea   . Dyspareunia   . PID (pelvic inflammatory disease)   . Allergy   . Wears glasses   . Chronic nausea   . Asthma     hospitalization 2008  . Diabetes mellitus without complication [facility](HCC)    Past Surgical History  Procedure Laterality Date  . Cesarean section    . Implanon      inserted 2010   Family History  Problem Relation Age of Onset  . Hypertension Mother    . Heart disease Mother     early 6140s  . Diabetes Paternal Grandmother   . Stroke Paternal Grandmother   . Other Father     unknown   Social History  Substance Use Topics  . Smoking status: Never Smoker   . Smokeless tobacco: Never Used  . Alcohol Use: No   OB History    Gravida Para Term Preterm AB TAB SAB Ectopic Multiple Living   1 1 1       1      Review of Systems  Constitutional: Positive for fever and chills.  HENT: Negative for facial swelling.   Respiratory: Negative for shortness of breath.   Cardiovascular: Negative for chest pain.  Gastrointestinal: Positive for nausea, vomiting, abdominal pain and diarrhea.  Genitourinary: Negative for dysuria.  Musculoskeletal: Negative for back pain.  Skin: Positive for wound. Negative for rash.  Neurological: Positive for headaches.  Psychiatric/Behavioral: The patient is not nervous/anxious.       Allergies  Review of patient's allergies indicates no known allergies.  Home Medications   Prior to Admission medications   Medication Sig Start Date End Date Taking? Authorizing Provider  albuterol (PROVENTIL HFA;VENTOLIN HFA) 108 (90 BASE) MCG/ACT inhaler Inhale 2 puffs into the lungs every 6 (six) hours as needed for wheezing or shortness of breath. Patient not taking: Reported on 07/15/2015 01/09/15   Kermit Baloavid S Tysinger, PA-C  ciprofloxacin (CIPRO) 250 MG tablet Take 1 tablet (250 mg  total) by mouth 2 (two) times daily. Patient not taking: Reported on 12/23/2015 07/17/15   Kermit Balo Tysinger, PA-C  citalopram (CELEXA) 20 MG tablet Take 1 tablet (20 mg total) by mouth daily. Patient not taking: Reported on 12/23/2015 07/11/15   Kermit Balo Tysinger, PA-C  HYDROcodone-acetaminophen (NORCO/VICODIN) 5-325 MG tablet Take 2 tablets by mouth every 4 (four) hours as needed. 12/23/15   Emi Holes, PA-C  norethindrone-ethinyl estradiol (JUNEL FE,GILDESS FE,LOESTRIN FE) 1-20 MG-MCG tablet Take 1 tablet by mouth daily. Patient not taking:  Reported on 12/23/2015 12/25/14   Verner Chol, CNM  ondansetron (ZOFRAN) 4 MG tablet Take 1 tablet (4 mg total) by mouth every 6 (six) hours. 12/23/15   Emi Holes, PA-C  sulfamethoxazole-trimethoprim (BACTRIM DS,SEPTRA DS) 800-160 MG tablet Take 1 tablet by mouth 2 (two) times daily. 12/23/15 12/30/15  Kaushik Maul M Cono Gebhard, PA-C   BP 126/85 mmHg  Pulse 96  Temp(Src) 98.4 F (36.9 C) (Oral)  Resp 15  SpO2 99%  LMP 12/19/2015 Physical Exam  Constitutional: She appears well-developed and well-nourished. No distress.  HENT:  Head: Normocephalic and atraumatic.  Eyes: Conjunctivae are normal. Right eye exhibits no discharge. Left eye exhibits no discharge. No scleral icterus.  Neck: Normal range of motion. Neck supple. No thyromegaly present.  Cardiovascular: Normal rate, regular rhythm, normal heart sounds and intact distal pulses.  Exam reveals no gallop and no friction rub.   No murmur heard. Pulmonary/Chest: Effort normal and breath sounds normal. No stridor. No respiratory distress. She has no wheezes. She has no rales.  Abdominal: Soft. Bowel sounds are normal. She exhibits no distension. There is no tenderness. There is no rebound and no guarding.  Musculoskeletal: She exhibits no edema.  Lymphadenopathy:    She has no cervical adenopathy.  Neurological: She is alert. Coordination normal.  Skin: Skin is warm and dry. No rash noted. She is not diaphoretic. No pallor.  Right axilla: 1 x 2 cm nodule with area of drainage; no noted fluctuance, some tenderness; Left axilla: 2, 1 x 1 cm nodules; one larger to 2 x 5 cm area of fluctuance and tenderness; pt can barely lift left arm due to pain  Psychiatric: She has a normal mood and affect.  Nursing note and vitals reviewed.   ED Course  .Marland KitchenIncision and Drainage Date/Time: 12/23/2015 8:04 PM Performed by: Emi Holes Authorized by: Emi Holes Consent: Verbal consent obtained. Consent given by: patient Type: abscess Body area:  upper extremity (L axilla) Anesthesia: local infiltration Local anesthetic: lidocaine 2% with epinephrine Anesthetic total: 6 ml Patient sedated: no Scalpel size: 11 Needle gauge: 27. Incision type: single straight Incision depth: dermal Complexity: complex Drainage: purulent and  bloody Drainage amount: copious Wound treatment: wound left open Packing material: 1/2 in gauze Patient tolerance: Patient tolerated the procedure well with no immediate complications   (including critical care time) Labs Review Labs Reviewed  CBC WITH DIFFERENTIAL/PLATELET - Abnormal; Notable for the following:    WBC 13.1 (*)    Hemoglobin 11.1 (*)    HCT 34.4 (*)    MCH 25.8 (*)    Neutro Abs 9.0 (*)    Monocytes Absolute 1.1 (*)    All other components within normal limits  URINALYSIS, ROUTINE W REFLEX MICROSCOPIC (NOT AT [facility]) - Abnormal; Notable for the following:    Color, Urine AMBER (*)    Hgb urine dipstick SMALL (*)    Protein, ur 30 (*)    All other components  within normal limits  URINE MICROSCOPIC-ADD ON - Abnormal; Notable for the following:    Squamous Epithelial / LPF 0-5 (*)    Bacteria, UA FEW (*)    All other components within normal limits  URINE CULTURE  BASIC METABOLIC PANEL  POC URINE PREG, ED    Imaging Review Ct Abdomen Pelvis W Contrast  12/23/2015  CLINICAL DATA:  Right lower quadrant pain, fever, nausea and vomiting EXAM: CT ABDOMEN AND PELVIS WITH CONTRAST TECHNIQUE: Multidetector CT imaging of the abdomen and pelvis was performed using the standard protocol following bolus administration of intravenous contrast. CONTRAST:  ISOVUE-300 IOPAMIDOL (ISOVUE-300) INJECTION 61% COMPARISON:  None. FINDINGS: Lower chest:  Lung bases are unremarkable Hepatobiliary: Enhanced liver is unremarkable. No calcified gallstones are noted within gallbladder. No intrahepatic biliary ductal dilatation. Pancreas: No mass, inflammatory changes, or other significant abnormality. Spleen:  Within normal limits in size and appearance. Adrenals/Urinary Tract: No adrenal gland mass is noted. Enhanced kidneys are symmetrical in size. No hydronephrosis or hydroureter. The urinary bladder is unremarkable. Stomach/Bowel: There is no gastric outlet obstruction. No small bowel obstruction. No mesenteric fluid collection. Moderate stool are noted in right colon. Abundant stool noted within cecum. No pericecal inflammation. Normal appendix partially visualized in axial image 54. Moderate gas noted in transverse colon. No distal colonic obstruction. Vascular/Lymphatic: No retroperitoneal or mesenteric adenopathy. No aortic aneurysm. No inguinal adenopathy. Reproductive: The uterus is anteflexed. No adnexal mass. A vaginal tampon is noted. Other: No ascites or free abdominal air. Musculoskeletal: No destructive bony lesions are noted. Sagittal images of the spine are unremarkable. IMPRESSION: 1. Moderate stool noted in right colon. Abundant stool noted within cecum. No pericecal inflammation. Normal appendix is partially visualized. 2. No small bowel obstruction. 3. No hydronephrosis or hydroureter. 4. No ascites or free air. Electronically Signed   By: Natasha Mead M.D.   On: 12/23/2015 18:41   I have personally reviewed and evaluated these images and lab results as part of my medical decision-making.   EKG Interpretation None      MDM   Patient with skin abscess.Patient also reported right lower quadrant pain. CT scan showed stool in the right colon where patient noted pain, otherwise unremarkable. CT shows elevated white count and mild anemia. BMP unremarkable.  Incision and drainage performed in the ED today.  Abscess was not large enough to warrant packing or drain placement. Wound recheck and follow up with PCP in 3 days. Supportive care and return precautions discussed.  Pt sent home with Bactrim, Norco, and Zofran. The patient appears reasonably screened and/or stabilized for discharge and I doubt  any other emergent medical condition requiring further screening, evaluation, or treatment in the ED prior to discharge. Strict return precautions discussed. She also seen by Dr. Criss Alvine who is in agreement with plan. Patient understands and agrees with the plan.    Final diagnoses:  Abscess of axilla, left  Abscess of axilla, right      Emi Holes, PA-C 12/23/15 2011  Pricilla Loveless, MD 12/24/15 1155

## 2015-12-23 NOTE — Discharge Instructions (Signed)
Medications: Bactrim, Norco, Zofran  Treatment: Please take Bactrim as prescribed for 1 week. Continue warm compresses on right abscess. Clean wound with warm soapy water and change dressing daily. Take 1-2 Norco every 4-6 hours as needed for pain. Take Zofran every 8 hours as needed for nausea.  Follow-up: Please follow-up with your primary care provider for wound recheck in 3 days. Please return to the emergency department if you're having increased redness, pain, streaking down the arm, or unimproved fever and nausea, or any other new or concerning symptoms.   Abscess An abscess is an infected area that contains a collection of pus and debris.It can occur in almost any part of the body. An abscess is also known as a furuncle or boil. CAUSES  An abscess occurs when tissue gets infected. This can occur from blockage of oil or sweat glands, infection of hair follicles, or a minor injury to the skin. As the body tries to fight the infection, pus collects in the area and creates pressure under the skin. This pressure causes pain. People with weakened immune systems have difficulty fighting infections and get certain abscesses more often.  SYMPTOMS Usually an abscess develops on the skin and becomes a painful mass that is red, warm, and tender. If the abscess forms under the skin, you may feel a moveable soft area under the skin. Some abscesses break open (rupture) on their own, but most will continue to get worse without care. The infection can spread deeper into the body and eventually into the bloodstream, causing you to feel ill.  DIAGNOSIS  Your caregiver will take your medical history and perform a physical exam. A sample of fluid may also be taken from the abscess to determine what is causing your infection. TREATMENT  Your caregiver may prescribe antibiotic medicines to fight the infection. However, taking antibiotics alone usually does not cure an abscess. Your caregiver may need to make a  small cut (incision) in the abscess to drain the pus. In some cases, gauze is packed into the abscess to reduce pain and to continue draining the area. HOME CARE INSTRUCTIONS   Only take over-the-counter or prescription medicines for pain, discomfort, or fever as directed by your caregiver.  If you were prescribed antibiotics, take them as directed. Finish them even if you start to feel better.  If gauze is used, follow your caregiver's directions for changing the gauze.  To avoid spreading the infection:  Keep your draining abscess covered with a bandage.  Wash your hands well.  Do not share personal care items, towels, or whirlpools with others.  Avoid skin contact with others.  Keep your skin and clothes clean around the abscess.  Keep all follow-up appointments as directed by your caregiver. SEEK MEDICAL CARE IF:   You have increased pain, swelling, redness, fluid drainage, or bleeding.  You have muscle aches, chills, or a general ill feeling.  You have a fever. MAKE SURE YOU:   Understand these instructions.  Will watch your condition.  Will get help right away if you are not doing well or get worse.   This information is not intended to replace advice given to you by your health care provider. Make sure you discuss any questions you have with your health care provider.   Document Released: 06/17/2005 Document Revised: 03/08/2012 Document Reviewed: 11/20/2011 Elsevier Interactive Patient Education 2016 Elsevier Inc.  Incision and Drainage Incision and drainage is a procedure in which a sac-like structure (cystic structure) is opened and drained.  The area to be drained usually contains material such as pus, fluid, or blood.  LET YOUR CAREGIVER KNOW ABOUT:   Allergies to medicine.  Medicines taken, including vitamins, herbs, eyedrops, over-the-counter medicines, and creams.  Use of steroids (by mouth or creams).  Previous problems with anesthetics or numbing  medicines.  History of bleeding problems or blood clots.  Previous surgery.  Other health problems, including diabetes and kidney problems.  Possibility of pregnancy, if this applies. RISKS AND COMPLICATIONS  Pain.  Bleeding.  Scarring.  Infection. BEFORE THE PROCEDURE  You may need to have an ultrasound or other imaging tests to see how large or deep your cystic structure is. Blood tests may also be used to determine if you have an infection or how severe the infection is. You may need to have a tetanus shot. PROCEDURE  The affected area is cleaned with a cleaning fluid. The cyst area will then be numbed with a medicine (local anesthetic). A small incision will be made in the cystic structure. A syringe or catheter may be used to drain the contents of the cystic structure, or the contents may be squeezed out. The area will then be flushed with a cleansing solution. After cleansing the area, it is often gently packed with a gauze or another wound dressing. Once it is packed, it will be covered with gauze and tape or some other type of wound dressing. AFTER THE PROCEDURE   Often, you will be allowed to go home right after the procedure.  You may be given antibiotic medicine to prevent or heal an infection.  If the area was packed with gauze or some other wound dressing, you will likely need to come back in 1 to 2 days to get it removed.  The area should heal in about 14 days.   This information is not intended to replace advice given to you by your health care provider. Make sure you discuss any questions you have with your health care provider.   Document Released: 03/03/2001 Document Revised: 03/08/2012 Document Reviewed: 11/02/2011 Elsevier Interactive Patient Education [employer]2016 Elsevier Inc.

## 2015-12-23 NOTE — ED Notes (Signed)
Phlebotomy at bedside for labs.  This RN attempted twice with no success.  Second RN asked to look.

## 2015-12-23 NOTE — ED Notes (Signed)
See PA note regarding bilateral axilla abscesses.

## 2015-12-24 LAB — URINE CULTURE

## 2015-12-31 ENCOUNTER — Ambulatory Visit: Payer: Managed Care, Other (non HMO) | Admitting: Certified Nurse Midwife

## 2016-01-22 ENCOUNTER — Emergency Department (HOSPITAL_COMMUNITY)
Admission: EM | Admit: 2016-01-22 | Discharge: 2016-01-22 | Disposition: A | Discharge status: Home / Self Care | Payer: Medicaid Other | Attending: Emergency Medicine | Admitting: Emergency Medicine

## 2016-01-22 ENCOUNTER — Encounter (HOSPITAL_COMMUNITY): Payer: Self-pay | Admitting: Family Medicine

## 2016-01-22 ENCOUNTER — Emergency Department (HOSPITAL_COMMUNITY): Payer: Medicaid Other

## 2016-01-22 DIAGNOSIS — R61 Generalized hyperhidrosis: Secondary | ICD-10-CM | POA: Diagnosis not present

## 2016-01-22 DIAGNOSIS — Z3A01 Less than 8 weeks gestation of pregnancy: Secondary | ICD-10-CM | POA: Insufficient documentation

## 2016-01-22 DIAGNOSIS — E119 Type 2 diabetes mellitus without complications: Secondary | ICD-10-CM | POA: Insufficient documentation

## 2016-01-22 DIAGNOSIS — Z8659 Personal history of other mental and behavioral disorders: Secondary | ICD-10-CM | POA: Diagnosis not present

## 2016-01-22 DIAGNOSIS — Z862 Personal history of diseases of the blood and blood-forming organs and certain disorders involving the immune mechanism: Secondary | ICD-10-CM | POA: Diagnosis not present

## 2016-01-22 DIAGNOSIS — O99511 Diseases of the respiratory system complicating pregnancy, first trimester: Secondary | ICD-10-CM | POA: Insufficient documentation

## 2016-01-22 DIAGNOSIS — R109 Unspecified abdominal pain: Secondary | ICD-10-CM

## 2016-01-22 DIAGNOSIS — R110 Nausea: Secondary | ICD-10-CM | POA: Insufficient documentation

## 2016-01-22 DIAGNOSIS — J45909 Unspecified asthma, uncomplicated: Secondary | ICD-10-CM | POA: Insufficient documentation

## 2016-01-22 DIAGNOSIS — Z8679 Personal history of other diseases of the circulatory system: Secondary | ICD-10-CM | POA: Diagnosis not present

## 2016-01-22 DIAGNOSIS — R1030 Lower abdominal pain, unspecified: Secondary | ICD-10-CM | POA: Diagnosis not present

## 2016-01-22 DIAGNOSIS — Z3490 Encounter for supervision of normal pregnancy, unspecified, unspecified trimester: Secondary | ICD-10-CM

## 2016-01-22 DIAGNOSIS — O9989 Other specified diseases and conditions complicating pregnancy, childbirth and the puerperium: Secondary | ICD-10-CM | POA: Insufficient documentation

## 2016-01-22 DIAGNOSIS — O99281 Endocrine, nutritional and metabolic diseases complicating pregnancy, first trimester: Secondary | ICD-10-CM | POA: Insufficient documentation

## 2016-01-22 LAB — CBC
HCT: 34.6 % — ABNORMAL LOW (ref 36.0–46.0)
Hemoglobin: 11.1 g/dL — ABNORMAL LOW (ref 12.0–15.0)
MCH: 25.5 pg — ABNORMAL LOW (ref 26.0–34.0)
MCHC: 32.1 g/dL (ref 30.0–36.0)
MCV: 79.4 fL (ref 78.0–100.0)
Platelets: 254 10*3/uL (ref 150–400)
RBC: 4.36 MIL/uL (ref 3.87–5.11)
RDW: 14.5 % (ref 11.5–15.5)
WBC: 9 10*3/uL (ref 4.0–10.5)

## 2016-01-22 LAB — URINALYSIS, ROUTINE W REFLEX MICROSCOPIC
Bilirubin Urine: NEGATIVE
Glucose, UA: NEGATIVE mg/dL
Hgb urine dipstick: NEGATIVE
Ketones, ur: NEGATIVE mg/dL
Nitrite: NEGATIVE
Protein, ur: NEGATIVE mg/dL
Specific Gravity, Urine: 1.029 (ref 1.005–1.030)
pH: 5.5 (ref 5.0–8.0)

## 2016-01-22 LAB — COMPREHENSIVE METABOLIC PANEL
ALT: 17 U/L (ref 14–54)
AST: 21 U/L (ref 15–41)
Albumin: 3.7 g/dL (ref 3.5–5.0)
Alkaline Phosphatase: 43 U/L (ref 38–126)
Anion gap: 6 (ref 5–15)
BUN: 6 mg/dL (ref 6–20)
CO2: 22 mmol/L (ref 22–32)
Calcium: 9.5 mg/dL (ref 8.9–10.3)
Chloride: 107 mmol/L (ref 101–111)
Creatinine, Ser: 0.59 mg/dL (ref 0.44–1.00)
GFR calc Af Amer: >60 mL/min (ref >60)
GFR calc non Af Amer: >60 mL/min (ref >60)
Glucose, Bld: 102 mg/dL — ABNORMAL HIGH (ref 65–99)
Potassium: 3.8 mmol/L (ref 3.5–5.1)
Sodium: 135 mmol/L (ref 135–145)
Total Bilirubin: 0.5 mg/dL (ref 0.3–1.2)
Total Protein: 7.6 g/dL (ref 6.5–8.1)

## 2016-01-22 LAB — URINE MICROSCOPIC-ADD ON

## 2016-01-22 LAB — WET PREP, GENITAL
Clue Cells Wet Prep HPF POC: NONE SEEN
Sperm: NONE SEEN
Trich, Wet Prep: NONE SEEN
Yeast Wet Prep HPF POC: NONE SEEN

## 2016-01-22 LAB — HCG, QUANTITATIVE, PREGNANCY: hCG, Beta Chain, Quant, S: 538 m[IU]/mL — ABNORMAL HIGH (ref <5)

## 2016-01-22 NOTE — ED Provider Notes (Signed)
CSN: [id number]     Arrival date & time 01/22/16  0808 History   First MD Initiated Contact with Patient 01/22/16 0831     Chief Complaint  Patient presents with  . Possible Pregnancy  . Abdominal Pain      Patient is a 30 y.o. female presenting with pregnancy problem and abdominal pain. The history is provided by the patient.  Possible Pregnancy Associated symptoms include abdominal pain.  Abdominal Pain Associated symptoms: nausea   Associated symptoms: no vaginal bleeding and no vaginal discharge    patient presents with positive home pregnancy test abdominal pain. Pregnancy test was about a week ago. Last menses was around 2 months ago. Around 3 days ago she began to have dull lower abdominal pain. States it feels as if she has to poop but she went to the bathroom and did not get better. She's now had some slight urinary fluency 2. No vaginal bleeding or discharge. Patient states her breasts do feel larger and sore also. She does not know what her blood type is.  Past Medical History  Diagnosis Date  . Abnormal Pap smear of cervix     2011?  . Migraines   . Anemia   . Anxiety   . Depression   . Dysmenorrhea   . Dyspareunia   . PID (pelvic inflammatory disease)   . Allergy   . Wears glasses   . Chronic nausea   . Asthma     hospitalization 2008  . Diabetes mellitus without complication [facility])    Past Surgical History  Procedure Laterality Date  . Cesarean section    . Implanon      inserted 2010   Family History  Problem Relation Age of Onset  . Hypertension Mother   . Heart disease Mother     early 40s  . Diabetes Paternal Grandmother   . Stroke Paternal Grandmother   . Other Father     unknown   Social History  Substance Use Topics  . Smoking status: Never Smoker   . Smokeless tobacco: Never Used  . Alcohol Use: No   OB History    Gravida Para Term Preterm AB TAB SAB Ectopic Multiple Living   Review of Systems  Constitutional:  Negative for diaphoresis and appetite change.  Gastrointestinal: Positive for nausea and abdominal pain.  Genitourinary: Positive for frequency. Negative for decreased urine volume, vaginal bleeding and vaginal discharge.  Musculoskeletal: Negative for back pain.  Skin: Negative for wound.  Hematological: Negative for adenopathy.      Allergies  Review of patient's allergies indicates no known allergies.  Home Medications   Prior to Admission medications   Not on File   BP 109/65 mmHg  Pulse 90  Temp(Src) 97.9 F (36.6 C)  Resp 14  SpO2 99%  LMP 11/20/2015 Physical Exam  HENT:  Head: Atraumatic.  Neck: Neck supple.  Cardiovascular: Normal rate.   Pulmonary/Chest: Effort normal.  Abdominal: There is tenderness.  Mild suprapubic tenderness without rebound or guarding.  Musculoskeletal: Normal range of motion.  Neurological: She is alert.  Skin: Skin is warm. She is diaphoretic.   pelvic exam shows slight white vaginal discharge without cervical motion tenderness. Os is closed.  ED Course  Procedures (including critical care time) Labs Review Labs Reviewed  WET PREP, GENITAL - Abnormal; Notable for the following:    WBC, Wet Prep HPF POC MODERATE (*)  All other components within normal limits  COMPREHENSIVE METABOLIC PANEL - Abnormal; Notable for the following:    Glucose, Bld 102 (*)    All other components within normal limits  CBC - Abnormal; Notable for the following:    Hemoglobin 11.1 (*)    HCT 34.6 (*)    MCH 25.5 (*)    All other components within normal limits  URINALYSIS, ROUTINE W REFLEX MICROSCOPIC (NOT AT [facility]) - Abnormal; Notable for the following:    APPearance CLOUDY (*)    Leukocytes, UA SMALL (*)    All other components within normal limits  HCG, QUANTITATIVE, PREGNANCY - Abnormal; Notable for the following:    hCG, Beta Chain, Quant, S 538 (*)    All other components within normal limits  URINE MICROSCOPIC-ADD ON - Abnormal; Notable for  the following:    Squamous Epithelial / LPF 6-30 (*)    Bacteria, UA FEW (*)    All other components within normal limits  RPR  HIV ANTIBODY (ROUTINE TESTING)  ABO/RH  GC/CHLAMYDIA PROBE AMP () NOT AT [facility]    Imaging Review [place] Ob Comp Less 14 Wks  01/22/2016  CLINICAL DATA:  Generalized abdominal pain for 3 days. Gestational age by LMP of 6 weeks 2 days. Quantitative beta HCG level 538. EXAM: OBSTETRIC <14 WK US AND TRANSVAGINAL OB US TECHNIQUE: Both transabdominal and transvaginal ultrasound examinations were performed for complete evaluation of the gestation as well as the maternal uterus, adnexal regions, and pelvic cul-de-sac. Transvaginal technique was performed to assess early pregnancy. COMPARISON:  None. FINDINGS: No intrauterine gestational sac or other fluid collection visualized in endometrial cavity. Endometrial thickness measures 15 mm transvaginally. No fibroids identified. A cyst with low level internal echoes is seen within the right ovary measuring 2.5 x 1.6 x 1.9 cm. This likely represents a hemorrhagic cyst. A hypoechoic lesion is also seen in the left ovary measuring 2.4 x 1.9 x 1.8 cm. This shows no central blood flow on color Doppler ultrasound although there is some peripheral blood flow. This appears intra-ovarian and favors a corpus luteum. No other adnexal mass identified. Tiny amount of simple free fluid noted. IMPRESSION: No IUP visualized. 2.4 cm indeterminate hypoechoic lesion appears to be located within the left ovary, which favors a corpus luteum with ectopic pregnancy considered less likely. Recommend continued close follow-up with quantitative HCG levels and ultrasound. Electronically Signed   By: Myles RosenthalJohn  Stahl M.D.   On: 01/22/2016 11:50   [place] Ob Transvaginal  01/22/2016  CLINICAL DATA:  Generalized abdominal pain for 3 days. Gestational age by LMP of 6 weeks 2 days. Quantitative beta HCG level 538. EXAM: OBSTETRIC <14 WK US AND TRANSVAGINAL OB US TECHNIQUE:  Both transabdominal and transvaginal ultrasound examinations were performed for complete evaluation of the gestation as well as the maternal uterus, adnexal regions, and pelvic cul-de-sac. Transvaginal technique was performed to assess early pregnancy. COMPARISON:  None. FINDINGS: No intrauterine gestational sac or other fluid collection visualized in endometrial cavity. Endometrial thickness measures 15 mm transvaginally. No fibroids identified. A cyst with low level internal echoes is seen within the right ovary measuring 2.5 x 1.6 x 1.9 cm. This likely represents a hemorrhagic cyst. A hypoechoic lesion is also seen in the left ovary measuring 2.4 x 1.9 x 1.8 cm. This shows no central blood flow on color Doppler ultrasound although there is some peripheral blood flow. This appears intra-ovarian and favors a corpus luteum. No other adnexal mass identified. Tiny amount of  simple free fluid noted. IMPRESSION: No IUP visualized. 2.4 cm indeterminate hypoechoic lesion appears to be located within the left ovary, which favors a corpus luteum with ectopic pregnancy considered less likely. Recommend continued close follow-up with quantitative HCG levels and ultrasound. Electronically Signed   By: Myles Rosenthal M.D.   On: 01/22/2016 11:50   I have personally reviewed and evaluated these images and lab results as part of my medical decision-making.   EKG Interpretation None      MDM   Final diagnoses:  Pregnant  Lower abdominal pain    Patient pregnant and lower abdominal pain. Beta hCG is only 500 but does have an ultrasound that does not show intrauterine pregnancy. No free fluid. Discussed with Dr. Jolayne Panther from the [facility] clinic. Will follow-up on Friday. Patient given ectopic precautions.    Benjiman Core, MD 01/22/16 1250

## 2016-01-22 NOTE — ED Notes (Signed)
Patient transported to Ultrasound 

## 2016-01-22 NOTE — ED Notes (Signed)
Pt here for abd cramping x 3 days. sts that she took a preg test at home and it was positive. Denies bleeding or discharge. sts LMP 1st or 2nd week in march.

## 2016-01-22 NOTE — Discharge Instructions (Signed)

## 2016-01-23 LAB — RPR: RPR Ser Ql: NONREACTIVE

## 2016-01-23 LAB — GC/CHLAMYDIA PROBE AMP (~~LOC~~) NOT AT ARMC
Chlamydia: NEGATIVE
Neisseria Gonorrhea: NEGATIVE

## 2016-01-23 LAB — HIV ANTIBODY (ROUTINE TESTING W REFLEX): HIV Screen 4th Generation wRfx: NONREACTIVE

## 2016-01-23 LAB — ABO/RH: ABO/RH(D): O POS

## 2016-01-24 ENCOUNTER — Other Ambulatory Visit: Payer: Self-pay

## 2016-01-24 DIAGNOSIS — O3680X0 Pregnancy with inconclusive fetal viability, not applicable or unspecified: Secondary | ICD-10-CM

## 2016-01-24 DIAGNOSIS — Z3200 Encounter for pregnancy test, result unknown: Secondary | ICD-10-CM

## 2016-01-24 LAB — HCG, QUANTITATIVE, PREGNANCY: hCG, Beta Chain, Quant, S: 1196 m[IU]/mL — ABNORMAL HIGH (ref <5)

## 2016-01-24 NOTE — Progress Notes (Signed)
Pt here today for STAT beta quant.  Notified Dr. Adrian BlackwaterStinson of results.  New orders-  OB f/u US schedule in two weeks for viability; verify with pt that she is not having any symptoms.  Pt states that she is currently not having any pain or bleeding and I informed pt that her US scheduled for  May 24th @ 1100.   Pt agreed with recommendations and had no further questions.

## 2016-02-12 ENCOUNTER — Ambulatory Visit (INDEPENDENT_AMBULATORY_CARE_PROVIDER_SITE_OTHER): Payer: Managed Care, Other (non HMO) | Admitting: Family

## 2016-02-12 ENCOUNTER — Other Ambulatory Visit: Payer: Self-pay | Admitting: Family Medicine

## 2016-02-12 ENCOUNTER — Encounter: Payer: Self-pay | Admitting: Family

## 2016-02-12 ENCOUNTER — Ambulatory Visit (HOSPITAL_COMMUNITY)
Admission: RE | Admit: 2016-02-12 | Discharge: 2016-02-12 | Disposition: A | Discharge status: Home / Self Care | Payer: Managed Care, Other (non HMO) | Source: Ambulatory Visit | Attending: Family Medicine | Admitting: Family Medicine

## 2016-02-12 DIAGNOSIS — Z09 Encounter for follow-up examination after completed treatment for conditions other than malignant neoplasm: Secondary | ICD-10-CM

## 2016-02-12 DIAGNOSIS — O3680X0 Pregnancy with inconclusive fetal viability, not applicable or unspecified: Secondary | ICD-10-CM

## 2016-02-12 DIAGNOSIS — Z3A01 Less than 8 weeks gestation of pregnancy: Secondary | ICD-10-CM | POA: Insufficient documentation

## 2016-02-12 DIAGNOSIS — N83202 Unspecified ovarian cyst, left side: Secondary | ICD-10-CM | POA: Diagnosis not present

## 2016-02-12 DIAGNOSIS — O26891 Other specified pregnancy related conditions, first trimester: Secondary | ICD-10-CM | POA: Diagnosis not present

## 2016-02-12 DIAGNOSIS — Z3200 Encounter for pregnancy test, result unknown: Secondary | ICD-10-CM | POA: Diagnosis present

## 2016-02-12 DIAGNOSIS — O209 Hemorrhage in early pregnancy, unspecified: Secondary | ICD-10-CM | POA: Diagnosis not present

## 2016-02-12 DIAGNOSIS — Z331 Pregnant state, incidental: Secondary | ICD-10-CM | POA: Insufficient documentation

## 2016-02-12 DIAGNOSIS — N83201 Unspecified ovarian cyst, right side: Secondary | ICD-10-CM | POA: Diagnosis not present
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
## 2016-03-18 ENCOUNTER — Ambulatory Visit (INDEPENDENT_AMBULATORY_CARE_PROVIDER_SITE_OTHER): Payer: Managed Care, Other (non HMO) | Admitting: Family

## 2016-03-18 ENCOUNTER — Encounter: Payer: Self-pay | Admitting: Family

## 2016-03-18 VITALS — BP 112/78 | HR 84 | Wt 207.6 lb

## 2016-03-18 DIAGNOSIS — Z3481 Encounter for supervision of other normal pregnancy, first trimester: Secondary | ICD-10-CM

## 2016-03-18 DIAGNOSIS — Z113 Encounter for screening for infections with a predominantly sexual mode of transmission: Secondary | ICD-10-CM

## 2016-03-18 DIAGNOSIS — O34219 Maternal care for unspecified type scar from previous cesarean delivery: Secondary | ICD-10-CM

## 2016-03-18 DIAGNOSIS — Z124 Encounter for screening for malignant neoplasm of cervix: Secondary | ICD-10-CM | POA: Diagnosis not present

## 2016-03-18 DIAGNOSIS — J4520 Mild intermittent asthma, uncomplicated: Secondary | ICD-10-CM

## 2016-03-18 DIAGNOSIS — N898 Other specified noninflammatory disorders of vagina: Secondary | ICD-10-CM

## 2016-03-18 DIAGNOSIS — O0990 Supervision of high risk pregnancy, unspecified, unspecified trimester: Secondary | ICD-10-CM | POA: Insufficient documentation

## 2016-03-18 DIAGNOSIS — Z3491 Encounter for supervision of normal pregnancy, unspecified, first trimester: Secondary | ICD-10-CM

## 2016-03-18 LAB — POCT URINALYSIS DIP (DEVICE)
Bilirubin Urine: NEGATIVE
Glucose, UA: NEGATIVE mg/dL
Hgb urine dipstick: NEGATIVE
Ketones, ur: NEGATIVE mg/dL
Leukocytes, UA: NEGATIVE
Nitrite: NEGATIVE
Protein, ur: 30 mg/dL — AB
Specific Gravity, Urine: 1.025 (ref 1.005–1.030)
Urobilinogen, UA: 1 mg/dL (ref 0.0–1.0)
pH: 6 (ref 5.0–8.0)

## 2016-03-18 MED ORDER — ALBUTEROL SULFATE HFA 108 (90 BASE) MCG/ACT IN AERS: 2.0000 | INHALATION_SPRAY | Freq: Four times a day (QID) | RESPIRATORY_TRACT | Status: DC | PRN

## 2016-03-18 NOTE — Addendum Note (Signed)
Addended by: Kathee DeltonHILLMAN, CARRIE L on: 03/18/2016 04:31 PM   Modules accepted: Orders

## 2016-03-18 NOTE — Addendum Note (Signed)
Addended by: Garret ReddishBARNES, Iyanah Demont M on: 03/18/2016 12:05 PM   Modules accepted: Orders

## 2016-03-18 NOTE — Progress Notes (Signed)
Subjective:    Vanessa Foster is a G2P1001 969w4d being seen today for her first obstetrical visit.  Her obstetrical history is significant for prior c-section. Desires TOLAC.  Also reports increased SOB/wheezing does not have inhaler Patient does intend to breast feed. Pregnancy history fully reviewed.  Patient reports increased wheezing.  Also reports vaginal discharge with odor.  Filed Vitals:   03/18/16 0821  BP: 112/78  Pulse: 84  Weight: 207 lb 9.6 oz (94.167 kg)    HISTORY: OB History  Gravida Para Term Preterm AB SAB TAB Ectopic Multiple Living  2 1 1       1     # Outcome Date GA Lbr Len/2nd Weight Sex Delivery Anes PTL Lv  2 Current           1 Term 10/13/07 8973w0d   M CS-LTranv   Y     Complications: Fetal Intolerance     Past Medical History  Diagnosis Date  . Abnormal Pap smear of cervix     2011?  . Migraines   . Anemia   . Anxiety   . Depression   . Dysmenorrhea   . Dyspareunia   . PID (pelvic inflammatory disease)   . Allergy   . Wears glasses   . Chronic nausea   . Asthma     hospitalization 2008   Past Surgical History  Procedure Laterality Date  . Cesarean section    . Implanon      inserted 2010   Family History  Problem Relation Age of Onset  . Hypertension Mother   . Heart disease Mother     early 7040s  . Diabetes Mother   . Diabetes Paternal Grandmother   . Stroke Paternal Grandmother   . Other Father     unknown     Exam    BP 112/78 mmHg  Pulse 84  Wt 207 lb 9.6 oz (94.167 kg)  LMP 11/20/2015 (LMP Unknown) Uterine Size: size equals dates  Pelvic Exam:    Perineum: No Hemorrhoids, Normal Perineum   Vulva: normal   Vagina:  normal mucosa, +white discharge with odor, no palpable nodules   pH: Not done   Cervix: no bleeding following Pap, no cervical motion tenderness and no lesions   Adnexa: normal adnexa and no mass, fullness, tenderness   Bony Pelvis: Adequate  System: Breast:  No nipple retraction or dimpling,  No nipple discharge or bleeding, No axillary or supraclavicular adenopathy, Normal to palpation without dominant masses   Skin: normal coloration and turgor, no rashes    Neurologic: negative   Extremities: normal strength, tone, and muscle mass   HEENT neck supple with midline trachea and thyroid without masses   Mouth/Teeth mucous membranes moist, pharynx normal without lesions   Neck supple and no masses   Cardiovascular: regular rate and rhythm, no murmurs or gallops   Respiratory:  appears well, vitals normal, no respiratory distress, acyanotic, normal RR, neck free of mass or lymphadenopathy, chest clear, no wheezing, crepitations, rhonchi, normal symmetric air entry   Abdomen: soft, non-tender; bowel sounds normal; no masses,  no organomegaly   Urinary: urethral meatus normal       Assessment:    Pregnancy: G2P1001 Patient Active Problem List   Diagnosis Date Noted  . Supervision of normal pregnancy, antepartum 03/18/2016  . History of cesarean delivery, antepartum 03/18/2016  . Daytime somnolence 07/11/2015  . Sleep disorder 07/11/2015  . Cephalalgia 07/11/2015  . Asthma, mild intermittent 07/11/2015  .  Encounter for health maintenance examination in adult 07/11/2015  . Screening for condition 07/11/2015  . Need for prophylactic vaccination and inoculation against influenza 07/11/2015  . Depressed mood 07/11/2015  . Chronic nausea 07/11/2015  . Changing skin lesion 07/11/2015  . Adjustment disorder with mixed anxiety and depressed mood 07/11/2015  . Hemorrhoids 04/30/2014  . Constipation 04/30/2014        Plan:     Initial labs drawn. Pap smear collected. Prenatal vitamins. RX Albuterol Problem list reviewed and updated. Genetic Screening discussed First Screen: declined.  Follow up in 4 weeks.Marland Kitchen.   Marlis EdelsonKARIM, Joline Encalada N 03/18/2016

## 2016-03-18 NOTE — Patient Instructions (Addendum)
Safe Medications in Pregnancy   Acne: Benzoyl Peroxide Salicylic Acid  Backache/Headache: Tylenol: 2 regular strength every 4 hours OR              2 Extra strength every 6 hours  Colds/Coughs/Allergies: Benadryl (alcohol free) 25 mg every 6 hours as needed Breath right strips Claritin Cepacol throat lozenges Chloraseptic throat spray Cold-Eeze- up to three times per day Cough drops, alcohol free Flonase (by prescription only) Guaifenesin Mucinex Robitussin DM (plain only, alcohol free) Saline nasal spray/drops Sudafed (pseudoephedrine) & Actifed ** use only after [redacted] weeks gestation and if you do not have high blood pressure Tylenol Vicks Vaporub Zinc lozenges Zyrtec   Constipation: Colace Ducolax suppositories Fleet enema Glycerin suppositories Metamucil Milk of magnesia Miralax Senokot Smooth move tea  Diarrhea: Kaopectate Imodium A-D  *NO pepto Bismol  Hemorrhoids: Anusol Anusol HC Preparation H Tucks  Indigestion: Tums Maalox Mylanta Zantac  Pepcid  Insomnia: Benadryl (alcohol free)  every 6 hours as needed Tylenol PM Unisom, no Gelcaps  Leg Cramps: Tums MagGel  Nausea/Vomiting:  Bonine Dramamine Emetrol Ginger extract Sea bands Meclizine  Nausea medication to take during pregnancy:  Unisom (doxylamine succinate 25 mg tablets) Take one tablet daily at bedtime. If symptoms are not adequately controlled, the dose can be increased to a maximum recommended dose of two tablets daily (1/2 tablet in the morning, 1/2 tablet mid-afternoon and one at bedtime). Vitamin B6  tablets. Take one tablet twice a day (up to 200 mg per day).  Skin Rashes: Aveeno products Benadryl cream or  every 6 hours as needed Calamine Lotion 1% cortisone cream  Yeast infection: Gyne-lotrimin 7 Monistat 7   **If taking multiple medications, please check labels to avoid duplicating the same active ingredients **take medication as directed on  the label ** Do not exceed 4000 mg of tylenol in 24 hours **Do not take medications that contain aspirin or ibuprofen   AREA PEDIATRIC/FAMILY PRACTICE PHYSICIANS  [facility] [address] [place], [place] [address] Phone - [phone]   Fax - [phone]  JACK AMOS [address] [place], [place]  [address] Phone - [phone]   Fax - [phone]  [facility] [address] [place], [place]  [address] Phone - [phone]   Fax - [phone]  [facility] [address] [place], [place]  [address] Phone - [phone]   Fax - [phone]  [facility] [address] [place], [place]  [address] Phone - [phone]   Fax - [phone]  [facility] [address] [place], [place]  [address] Phone - [phone]   Fax - [phone]  [facility] [address] [place], [place]  [address] Phone - [phone]   Fax - [phone]  [facility] [address] [place] [place], [place]  [address] Phone - [phone]   Fax - [phone]  [facility] [address] [place] [employer] [place], [place]  [address] Phone - [phone]   Fax - [phone] [facility] [place] [address] [place], [place]  [address] Phone - [phone]   Fax - [phone]  [facility] [address] [place], [place]  [address] Phone - [phone]   Fax - [phone]  [facility] [address], [address] [place], [place]  [address] Phone - [phone]   Fax - [phone]  [facility] [address] [place], [place]  [address] Phone - [phone]   Fax - [phone]  [facility] [address] [place], [place]  [address] Phone - [phone]  [facility] [address] [place], [place]  [address] Phone - [phone]   Fax - [phone]  [facility] [address] [place], [place]  [address] Phone - [phone]   Fax - [phone]  [facility] [address] [place], [place]  [address] Phone - [phone]   Fax - [phone]  [facility] [address] [place], [place]  [address] Phone - [phone]   Fax - [phone]   [facility] [address] [place], [place]  [address] Phone - [phone]   Fax - [phone]  [facility] [address] [place], [place]  [address] Phone - [phone]   Fax - [phone]  DAVID RUBIN [address] [place], [place]  [address] Phone - [phone]   Fax - [phone]  [facility] [address] [place], [place]  [address] Phone - [phone]   Fax - [phone]  [facility] [address] [place], [place]  [address] Phone - [phone]   Fax - [phone] Gerarda Fraction [address] [place], [place]  [address] Phone - [phone]   Fax - [phone]  [facility] [address] [place], [place]  [address] Phone - [phone]   Fax - [phone]  [facility] [place] [address] [place], [place]  [address] Phone - [phone]   Fax - [phone]   First Trimester of Pregnancy The first trimester of pregnancy is from week 1 until the end of week 12 (months 1 through 3). A week after a sperm fertilizes an egg, the egg will implant on the wall of the uterus. This embryo will begin to develop into a baby. Genes from you and your partner are forming the baby. The female genes determine whether the baby is a boy or a girl. At 6-8 weeks, the eyes and face are formed, and the heartbeat can be seen on ultrasound. At the end of 12 weeks, all the baby's organs are  formed.  Now that you are pregnant, you will want to do everything you can to have a healthy baby. Two of the most important things are to get good prenatal care and to follow your health care provider's instructions. Prenatal care is all the medical care you receive before the baby's birth. This care will help prevent, find, and treat any problems during the pregnancy and childbirth. BODY CHANGES Your body goes through many changes during pregnancy. The changes vary from woman to woman.   You may gain or lose a couple of pounds at first.  You may feel sick to your stomach (nauseous) and throw up (vomit). If the vomiting is uncontrollable, call your health care provider.  You may tire easily.  You may develop headaches that can be relieved by medicines approved by your health care provider.  You may urinate more often. Painful urination may mean you have a bladder infection.  You may develop heartburn as a result of your pregnancy.  You may develop constipation because certain hormones are causing the muscles that push waste through your intestines to slow down.  You may develop hemorrhoids or swollen, bulging veins (varicose veins).  Your breasts may begin to grow larger and become tender. Your nipples may stick out more, and the tissue that surrounds them (areola) may become darker.  Your gums may bleed and may be sensitive to brushing and flossing.  Dark spots or blotches (chloasma, mask of pregnancy) may develop on your face. This will likely fade after the baby is born.  Your menstrual periods will stop.  You may have a loss of appetite.  You may develop cravings for certain kinds of food.  You may have changes in your emotions from day to day, such as being excited to be pregnant or being concerned that something may go wrong with the pregnancy and baby.  You may have more vivid and strange dreams.  You may have changes in your hair. These can include thickening of your  hair, rapid growth, and changes in texture. Some women also have hair loss during or after pregnancy, or hair that feels dry or thin. Your hair will most likely return to normal after your baby is born. WHAT TO EXPECT AT YOUR PRENATAL VISITS During a routine prenatal visit:  You will be weighed to make sure you and the baby are growing normally.  Your blood pressure will be taken.  Your abdomen will be measured to track your baby's growth.  The fetal heartbeat will be listened to starting around week 10 or 12 of your pregnancy.  Test results from any previous visits will be discussed. Your health care provider may ask you:  How you are feeling.  If you are feeling the baby move.  If you have had any abnormal symptoms, such as leaking fluid, bleeding, severe headaches, or abdominal cramping.  If you are using any tobacco products, including cigarettes, chewing tobacco, and electronic cigarettes.  If you have any questions. Other tests that may be performed during your first trimester include:  Blood tests to find your blood type and to check for the presence of any previous infections. They will also be used to check for low iron levels (anemia) and Rh antibodies. Later in the pregnancy, blood tests for diabetes will be done along with other tests if problems develop.  Urine tests to check for infections, diabetes, or protein in the urine.  An ultrasound to confirm the proper growth and development of the baby.  An amniocentesis to check for possible genetic problems.  Fetal screens for spina bifida and Down syndrome.  You may need other tests to make sure you and the baby are doing well.  HIV (human immunodeficiency virus) testing. Routine prenatal testing includes screening for HIV, unless you choose not to have this test. HOME CARE INSTRUCTIONS  Medicines  Follow your health care provider's instructions regarding medicine use. Specific medicines may be either safe or  unsafe to take during pregnancy.  Take your prenatal vitamins as directed.  If you develop constipation, try taking a stool softener if your health care provider approves. Diet  Eat regular, well-balanced meals. Choose a variety of foods, such as meat or vegetable-based protein, fish, milk and low-fat dairy products, vegetables, fruits, and whole grain breads and cereals. Your health care provider will help you determine the amount of weight gain that is right for you.  Avoid raw meat and uncooked cheese. These carry germs that can cause birth defects in the baby.  Eating four or five small meals rather than three large meals a day may help relieve nausea and vomiting. If you start to feel nauseous, eating a few  soda crackers can be helpful. Drinking liquids between meals instead of during meals also seems to help nausea and vomiting.  If you develop constipation, eat more high-fiber foods, such as fresh vegetables or fruit and whole grains. Drink enough fluids to keep your urine clear or pale yellow. Activity and Exercise  Exercise only as directed by your health care provider. Exercising will help you:  Control your weight.  Stay in shape.  Be prepared for labor and delivery.  Experiencing pain or cramping in the lower abdomen or low back is a good sign that you should stop exercising. Check with your health care provider before continuing normal exercises.  Try to avoid standing for long periods of time. Move your legs often if you must stand in one place for a long time.  Avoid heavy lifting.  Wear low-heeled shoes, and practice good posture.  You may continue to have sex unless your health care provider directs you otherwise. Relief of Pain or Discomfort  Wear a good support bra for breast tenderness.   Take warm sitz baths to soothe any pain or discomfort caused by hemorrhoids. Use hemorrhoid cream if your health care provider approves.   Rest with your legs elevated if  you have leg cramps or low back pain.  If you develop varicose veins in your legs, wear support hose. Elevate your feet for 15 minutes, 3-4 times a day. Limit salt in your diet. Prenatal Care  Schedule your prenatal visits by the twelfth week of pregnancy. They are usually scheduled monthly at first, then more often in the last 2 months before delivery.  Write down your questions. Take them to your prenatal visits.  Keep all your prenatal visits as directed by your health care provider. Safety  Wear your seat belt at all times when driving.  Make a list of emergency phone numbers, including numbers for family, friends, the hospital, and police and fire departments. General Tips  Ask your health care provider for a referral to a local prenatal education class. Begin classes no later than at the beginning of month 6 of your pregnancy.  Ask for help if you have counseling or nutritional needs during pregnancy. Your health care provider can offer advice or refer you to specialists for help with various needs.  Do not use hot tubs, steam rooms, or saunas.  Do not douche or use tampons or scented sanitary pads.  Do not cross your legs for long periods of time.  Avoid cat litter boxes and soil used by cats. These carry germs that can cause birth defects in the baby and possibly loss of the fetus by miscarriage or stillbirth.  Avoid all smoking, herbs, alcohol, and medicines not prescribed by your health care provider. Chemicals in these affect the formation and growth of the baby.  Do not use any tobacco products, including cigarettes, chewing tobacco, and electronic cigarettes. If you need help quitting, ask your health care provider. You may receive counseling support and other resources to help you quit.  Schedule a dentist appointment. At home, brush your teeth with a soft toothbrush and be gentle when you floss. SEEK MEDICAL CARE IF:   You have dizziness.  You have mild pelvic  cramps, pelvic pressure, or nagging pain in the abdominal area.  You have persistent nausea, vomiting, or diarrhea.  You have a bad smelling vaginal discharge.  You have pain with urination.  You notice increased swelling in your face, hands, legs, or ankles. SEEK IMMEDIATE MEDICAL CARE  IF:   You have a fever.  You are leaking fluid from your vagina.  You have spotting or bleeding from your vagina.  You have severe abdominal cramping or pain.  You have rapid weight gain or loss.  You vomit blood or material that looks like coffee grounds.  You are exposed to [place] measles and have never had them.  You are exposed to fifth disease or chickenpox.  You develop a severe headache.  You have shortness of breath.  You have any kind of trauma, such as from a fall or a car accident.   This information is not intended to replace advice given to you by your health care provider. Make sure you discuss any questions you have with your health care provider.   Document Released: 09/01/2001 Document Revised: 09/28/2014 Document Reviewed: 07/18/2013 Elsevier Interactive Patient Education [employer]2016 Elsevier Inc.

## 2016-03-18 NOTE — Progress Notes (Signed)
Patient has a history of Asthma but has not being taken her inhaler. She is complaining of SOB at times.  1 hour gtt

## 2016-03-19 LAB — PRENATAL PROFILE (SOLSTAS)
Antibody Screen: NEGATIVE
Basophils Absolute: 0 cells/uL (ref 0–200)
Basophils Relative: 0 %
Eosinophils Absolute: 285 cells/uL (ref 15–500)
Eosinophils Relative: 3 %
HCT: 32.8 % — ABNORMAL LOW (ref 35.0–45.0)
HIV 1&2 Ab, 4th Generation: NONREACTIVE
Hemoglobin: 10.6 g/dL — ABNORMAL LOW (ref 11.7–15.5)
Hepatitis B Surface Ag: NEGATIVE
Lymphocytes Relative: 28 %
Lymphs Abs: 2660 cells/uL (ref 850–3900)
MCH: 25.7 pg — ABNORMAL LOW (ref 27.0–33.0)
MCHC: 32.3 g/dL (ref 32.0–36.0)
MCV: 79.4 fL — ABNORMAL LOW (ref 80.0–100.0)
MPV: 9.3 fL (ref 7.5–12.5)
Monocytes Absolute: 665 cells/uL (ref 200–950)
Monocytes Relative: 7 %
Neutro Abs: 5890 cells/uL (ref 1500–7800)
Neutrophils Relative %: 62 %
Platelets: 224 10*3/uL (ref 140–400)
RBC: 4.13 MIL/uL (ref 3.80–5.10)
RDW: 14.7 % (ref 11.0–15.0)
Rh Type: POSITIVE
Rubella: 6.21 Index — ABNORMAL HIGH (ref <0.90)
WBC: 9.5 10*3/uL (ref 3.8–10.8)

## 2016-03-19 LAB — GC/CHLAMYDIA PROBE AMP (~~LOC~~) NOT AT ARMC
Chlamydia: NEGATIVE
Neisseria Gonorrhea: NEGATIVE

## 2016-03-19 LAB — CYTOLOGY - PAP

## 2016-03-19 LAB — WET PREP, GENITAL
Trich, Wet Prep: NONE SEEN
Yeast Wet Prep HPF POC: NONE SEEN

## 2016-03-19 LAB — GLUCOSE TOLERANCE, 1 HOUR (50G) W/O FASTING: Glucose, 1 Hr, gestational: 142 mg/dL — ABNORMAL HIGH (ref <140)

## 2016-03-20 ENCOUNTER — Encounter (HOSPITAL_COMMUNITY): Payer: Self-pay | Admitting: *Deleted

## 2016-03-20 ENCOUNTER — Inpatient Hospital Stay (HOSPITAL_COMMUNITY)
Admission: AD | Admit: 2016-03-20 | Discharge: 2016-03-20 | Disposition: A | Discharge status: Home / Self Care | Payer: Managed Care, Other (non HMO) | Source: Ambulatory Visit | Attending: Obstetrics & Gynecology | Admitting: Obstetrics & Gynecology

## 2016-03-20 DIAGNOSIS — O9989 Other specified diseases and conditions complicating pregnancy, childbirth and the puerperium: Secondary | ICD-10-CM | POA: Diagnosis not present

## 2016-03-20 DIAGNOSIS — O26892 Other specified pregnancy related conditions, second trimester: Secondary | ICD-10-CM | POA: Diagnosis not present

## 2016-03-20 DIAGNOSIS — R1031 Right lower quadrant pain: Secondary | ICD-10-CM | POA: Insufficient documentation

## 2016-03-20 DIAGNOSIS — Z3A12 12 weeks gestation of pregnancy: Secondary | ICD-10-CM | POA: Insufficient documentation

## 2016-03-20 DIAGNOSIS — Z87891 Personal history of nicotine dependence: Secondary | ICD-10-CM | POA: Insufficient documentation

## 2016-03-20 DIAGNOSIS — O9A212 Injury, poisoning and certain other consequences of external causes complicating pregnancy, second trimester: Secondary | ICD-10-CM

## 2016-03-20 DIAGNOSIS — F329 Major depressive disorder, single episode, unspecified: Secondary | ICD-10-CM | POA: Diagnosis not present

## 2016-03-20 DIAGNOSIS — O99341 Other mental disorders complicating pregnancy, first trimester: Secondary | ICD-10-CM | POA: Insufficient documentation

## 2016-03-20 DIAGNOSIS — O26899 Other specified pregnancy related conditions, unspecified trimester: Secondary | ICD-10-CM

## 2016-03-20 DIAGNOSIS — X58XXXA Exposure to other specified factors, initial encounter: Secondary | ICD-10-CM | POA: Diagnosis not present

## 2016-03-20 DIAGNOSIS — T1490 Injury, unspecified: Secondary | ICD-10-CM | POA: Insufficient documentation

## 2016-03-20 DIAGNOSIS — F419 Anxiety disorder, unspecified: Secondary | ICD-10-CM | POA: Insufficient documentation

## 2016-03-20 DIAGNOSIS — N949 Unspecified condition associated with female genital organs and menstrual cycle: Secondary | ICD-10-CM | POA: Diagnosis not present

## 2016-03-20 DIAGNOSIS — R102 Pelvic and perineal pain: Secondary | ICD-10-CM

## 2016-03-20 LAB — HEMOGLOBINOPATHY EVALUATION
HCT: 32.8 % — ABNORMAL LOW (ref 35.0–45.0)
Hemoglobin: 10.6 g/dL — ABNORMAL LOW (ref 11.7–15.5)
Hgb A2 Quant: 2 % (ref 1.8–3.5)
Hgb A: 97 % (ref >96.0)
Hgb F Quant: <1 % (ref <2.0)
MCH: 25.7 pg — ABNORMAL LOW (ref 27.0–33.0)
MCV: 79.4 fL — ABNORMAL LOW (ref 80.0–100.0)
RBC: 4.13 MIL/uL (ref 3.80–5.10)
RDW: 14.7 % (ref 11.0–15.0)

## 2016-03-20 LAB — URINALYSIS, ROUTINE W REFLEX MICROSCOPIC
Bilirubin Urine: NEGATIVE
Glucose, UA: NEGATIVE mg/dL
Hgb urine dipstick: NEGATIVE
Ketones, ur: NEGATIVE mg/dL
Leukocytes, UA: NEGATIVE
Nitrite: NEGATIVE
Protein, ur: NEGATIVE mg/dL
Specific Gravity, Urine: 1.02 (ref 1.005–1.030)
pH: 6 (ref 5.0–8.0)

## 2016-03-20 LAB — CULTURE, OB URINE
Colony Count: NO GROWTH
Organism ID, Bacteria: NO GROWTH

## 2016-03-20 MED ORDER — IBUPROFEN 600 MG PO TABS: 600.0000 mg | ORAL_TABLET | Freq: Four times a day (QID) | ORAL | Status: DC | PRN

## 2016-03-20 MED ORDER — CYCLOBENZAPRINE HCL 5 MG PO TABS
5.0000 mg | ORAL_TABLET | Freq: Once | ORAL | Status: AC
  Administered 2016-03-20: 5 mg via ORAL
  Filled 2016-03-20: qty 1

## 2016-03-20 MED ORDER — CYCLOBENZAPRINE HCL 10 MG PO TABS: 5.0000 mg | ORAL_TABLET | Freq: Three times a day (TID) | ORAL | Status: DC | PRN

## 2016-03-20 MED ORDER — IBUPROFEN 600 MG PO TABS
600.0000 mg | ORAL_TABLET | Freq: Once | ORAL | Status: AC
  Administered 2016-03-20: 600 mg via ORAL
  Filled 2016-03-20: qty 1

## 2016-03-20 NOTE — Discharge Instructions (Signed)
What Do I Need to Know About Injuries During Pregnancy? Injuries can happen during pregnancy. Minor falls and accidents usually do not harm you or your baby. However, any injury should be reported to your doctor. WHAT CAN I DO TO PROTECT MYSELF FROM INJURIES?  Remove rugs and loose objects on the floor.  Wear comfortable shoes that have a good grip. Do not wear high-heeled shoes.  Always wear your seat belt. The lap belt should be below your belly. Always practice safe driving.  Do not ride on a motorcycle.  Do not participate in high-impact activities or sports.  Avoid:  Walking on wet or slippery floors.  Fires.  Starting fires.  Lifting heavy pots of boiling or hot liquids.  Fixing electrical problems.  Only take medicine as told by your doctor.  Know your blood type and the blood type of the baby's father.  Call your local emergency services (911 in the [place] if you are a victim of domestic violence or assault. For help and support, contact the [employer] Domestic Violence Hotline. WHEN SHOULD I GET HELP RIGHT AWAY?  You fall on your belly or have any high-impact accident or injury.  You have been a victim of domestic violence or any kind of violence.  You have been in a car accident.  You have bleeding from your vagina.  Fluid is leaking from your vagina.  You start to have belly cramping (contractions) or pain.  You feel weak or pass out (faint).  You start to throw up (vomit) after an injury.  You have been burned.  You have a stiff neck or neck pain.  You get a headache or have vision problems after an injury.  You do not feel the baby move or the baby is not moving as much as normal.   This information is not intended to replace advice given to you by your health care provider. Make sure you discuss any questions you have with your health care provider.   Document Released: 10/10/2010 Document Revised: 09/28/2014 Document Reviewed:  06/14/2013 Elsevier Interactive Patient Education 2016 Elsevier Inc.  Musculoskeletal Pain Musculoskeletal pain is muscle and boney aches and pains. These pains can occur in any part of the body. Your caregiver may treat you without knowing the cause of the pain. They may treat you if blood or urine tests, X-rays, and other tests were normal.  CAUSES There is often not a definite cause or reason for these pains. These pains may be caused by a type of germ (virus). The discomfort may also come from overuse. Overuse includes working out too hard when your body is not fit. Boney aches also come from weather changes. Bone is sensitive to atmospheric pressure changes. HOME CARE INSTRUCTIONS   Ask when your test results will be ready. Make sure you get your test results.  Only take over-the-counter or prescription medicines for pain, discomfort, or fever as directed by your caregiver. If you were given medications for your condition, do not drive, operate machinery or power tools, or sign legal documents for 24 hours. Do not drink alcohol. Do not take sleeping pills or other medications that may interfere with treatment.  Continue all activities unless the activities cause more pain. When the pain lessens, slowly resume normal activities. Gradually increase the intensity and duration of the activities or exercise.  During periods of severe pain, bed rest may be helpful. Lay or sit in any position that is comfortable.  Putting ice on the injured area.  Put ice in a bag.  Place a towel between your skin and the bag.  Leave the ice on for 15 to 20 minutes, 3 to 4 times a day.  Follow up with your caregiver for continued problems and no reason can be found for the pain. If the pain becomes worse or does not go away, it may be necessary to repeat tests or do additional testing. Your caregiver may need to look further for a possible cause. SEEK IMMEDIATE MEDICAL CARE IF:  You have pain that is  getting worse and is not relieved by medications.  You develop chest pain that is associated with shortness or breath, sweating, feeling sick to your stomach (nauseous), or throw up (vomit).  Your pain becomes localized to the abdomen.  You develop any new symptoms that seem different or that concern you. MAKE SURE YOU:   Understand these instructions.  Will watch your condition.  Will get help right away if you are not doing well or get worse.   This information is not intended to replace advice given to you by your health care provider. Make sure you discuss any questions you have with your health care provider.   Document Released: 09/07/2005 Document Revised: 11/30/2011 Document Reviewed: 05/12/2013 Elsevier Interactive Patient Education [employer] Elsevier Inc.

## 2016-03-20 NOTE — MAU Note (Signed)
Urine in lab 

## 2016-03-20 NOTE — MAU Note (Signed)
Caught a pt that was falling since then has been having pain in RLQ into privates
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
## 2016-03-21 ENCOUNTER — Encounter: Payer: Self-pay | Admitting: Family

## 2016-03-21 ENCOUNTER — Other Ambulatory Visit: Payer: Self-pay | Admitting: Family

## 2016-03-21 DIAGNOSIS — B9689 Other specified bacterial agents as the cause of diseases classified elsewhere: Secondary | ICD-10-CM | POA: Insufficient documentation

## 2016-03-21 DIAGNOSIS — N760 Acute vaginitis: Secondary | ICD-10-CM

## 2016-03-21 MED ORDER — METRONIDAZOLE 500 MG PO TABS: 500.0000 mg | ORAL_TABLET | Freq: Two times a day (BID) | ORAL | Status: DC

## 2016-03-23 ENCOUNTER — Telehealth: Payer: Self-pay | Admitting: *Deleted

## 2016-03-23 NOTE — Telephone Encounter (Signed)
Called patient to inform her of need for 3hr gtt. No answer, message left to call us back.

## 2016-03-23 NOTE — Telephone Encounter (Signed)
-----   Message from Marlis EdelsonWalidah N Karim, [place] sent at 03/21/2016 12:27 AM EDT ----- Regarding: Need 3 hr Please notify patient to come in for 3 hr GTT.

## 2016-03-23 NOTE — Telephone Encounter (Signed)
-----   Message from Marlis EdelsonWalidah N Karim, CNM sent at 03/21/2016 12:27 AM EDT ----- Regarding: BV; need RX Please call and notify regarding BV and RX for Flagyl and 3 hr needed. ----- Message -----    From: Lab in [facility] Interface    Sent: 03/18/2016   7:56 PM      To: Marlis EdelsonWalidah N Karim, CNM

## 2016-03-31 NOTE — Telephone Encounter (Addendum)
Called pt to notify her of +BV, treatment prescribed and need for 3hr GTT.  There was no answer.   7/12  0920  Called pt again - no answer.

## 2016-04-02 NOTE — Telephone Encounter (Signed)
Patient called today stating she is experiencing a lot of vomiting and dizzy spells.

## 2016-04-07 NOTE — Telephone Encounter (Signed)
Patient work into office wanting to be seen for nausea and vomiting on/off. Patient is requesting to be seen but the office does not have provider this afternoon. I advised patient to go to MAU if symptoms get worse. I advised patient to try to eat smaller meals thought out the day.

## 2016-04-09 ENCOUNTER — Other Ambulatory Visit: Payer: Managed Care, Other (non HMO)

## 2016-04-09 DIAGNOSIS — R7309 Other abnormal glucose: Secondary | ICD-10-CM

## 2016-04-10 LAB — GLUCOSE TOLERANCE, 3 HOURS
Glucose Tolerance, 1 hour: 178 mg/dL (ref <190)
Glucose Tolerance, 2 hour: 168 mg/dL — ABNORMAL HIGH (ref <165)
Glucose Tolerance, Fasting: 89 mg/dL (ref 65–104)
Glucose, GTT - 3 Hour: 121 mg/dL (ref <145)

## 2016-04-15 ENCOUNTER — Ambulatory Visit (INDEPENDENT_AMBULATORY_CARE_PROVIDER_SITE_OTHER): Payer: Managed Care, Other (non HMO) | Admitting: Family Medicine

## 2016-04-15 VITALS — BP 116/76 | HR 112 | Wt 212.0 lb

## 2016-04-15 DIAGNOSIS — Z3689 Encounter for other specified antenatal screening: Secondary | ICD-10-CM

## 2016-04-15 DIAGNOSIS — O219 Vomiting of pregnancy, unspecified: Secondary | ICD-10-CM

## 2016-04-15 DIAGNOSIS — Z3482 Encounter for supervision of other normal pregnancy, second trimester: Secondary | ICD-10-CM | POA: Diagnosis not present

## 2016-04-15 DIAGNOSIS — R110 Nausea: Secondary | ICD-10-CM

## 2016-04-15 DIAGNOSIS — J4520 Mild intermittent asthma, uncomplicated: Secondary | ICD-10-CM

## 2016-04-15 LAB — POCT URINALYSIS DIP (DEVICE)
Bilirubin Urine: NEGATIVE
Glucose, UA: NEGATIVE mg/dL
Hgb urine dipstick: NEGATIVE
Ketones, ur: NEGATIVE mg/dL
Nitrite: NEGATIVE
Protein, ur: NEGATIVE mg/dL
Specific Gravity, Urine: 1.02 (ref 1.005–1.030)
Urobilinogen, UA: 0.2 mg/dL (ref 0.0–1.0)
pH: 6.5 (ref 5.0–8.0)

## 2016-04-15 MED ORDER — FLUTICASONE PROPIONATE HFA 110 MCG/ACT IN AERO: 1.0000 | INHALATION_SPRAY | Freq: Two times a day (BID) | RESPIRATORY_TRACT | 12 refills | Status: DC

## 2016-04-15 MED ORDER — FLUTICASONE PROPIONATE HFA 110 MCG/ACT IN AERO: 1.0000 | INHALATION_SPRAY | Freq: Two times a day (BID) | RESPIRATORY_TRACT | Status: DC

## 2016-04-15 NOTE — Progress Notes (Signed)
Patient reports severe dizziness for a couple weeks now; patient reports vomiting at times due to dizziness  Discussed breastfeeding with patient

## 2016-04-15 NOTE — Progress Notes (Signed)
Subjective:  Vanessa Foster is a 30 y.o. G2P1001 at [redacted]w[redacted]d being seen today for ongoing prenatal care.  She is currently monitored for the following issues for this low-risk pregnancy and has Hemorrhoids; Constipation; Daytime somnolence; Sleep disorder; Cephalalgia; Asthma, mild intermittent; Encounter for health maintenance examination in adult; Screening for condition; Need for prophylactic vaccination and inoculation against influenza; Depressed mood; Chronic nausea; Changing skin lesion; Adjustment disorder with mixed anxiety and depressed mood; Supervision of normal pregnancy, antepartum; History of cesarean delivery, antepartum; and Bacterial vaginosis on her problem list.  Patient reports dizziness on a daily basis with intermittent nausea.   Using inhaler 3 times a day due to SOB, wheezing.  Contractions: Not present. Vag. Bleeding: None.  Movement: Present. Denies leaking of fluid.   The following portions of the patient's history were reviewed and updated as appropriate: allergies, current medications, past family history, past medical history, past social history, past surgical history and problem list. Problem list updated.  Objective:   Vitals:   04/15/16 1516  BP: 116/76  Pulse: (!) 112  Weight: 212 lb (96.2 kg)    Fetal Status: Fetal Heart Rate (bpm): 150   Movement: Present     General:  Alert, oriented and cooperative. Patient is in no acute distress.  Skin: Skin is warm and dry. No rash noted.   Cardiovascular: Normal heart rate noted  Respiratory: Normal respiratory effort, no problems with respiration noted  Abdomen: Soft, gravid, appropriate for gestational age. Pain/Pressure: Absent     Pelvic:  Cervical exam deferred        Extremities: Normal range of motion.  Edema: Trace  Mental Status: Normal mood and affect. Normal behavior. Normal judgment and thought content.   Urinalysis:      Assessment and Plan:  Pregnancy: G2P1001 at [redacted]w[redacted]d  1. Encounter for  fetal anatomic survey - [place] MFM OB COMP + 14 WK; Future  2. Supervision of normal pregnancy, antepartum, second trimester FH and FHT normal  3. Asthma, mild intermittent, uncomplicated Increase therapy to flovent twice a day.  4. Chronic nausea With HA - will refer to Nada Maclachlan, PA-C for headache evaluation.  Preterm labor symptoms and general obstetric precautions including but not limited to vaginal bleeding, contractions, leaking of fluid and fetal movement were reviewed in detail with the patient. Please refer to After Visit Summary for other counseling recommendations.  No Follow-up on file.   Levie Heritage, DO

## 2016-04-16 ENCOUNTER — Institutional Professional Consult (permissible substitution): Payer: Self-pay

## 2016-04-16 ENCOUNTER — Ambulatory Visit (INDEPENDENT_AMBULATORY_CARE_PROVIDER_SITE_OTHER): Payer: Self-pay | Admitting: Clinical

## 2016-04-16 DIAGNOSIS — F332 Major depressive disorder, recurrent severe without psychotic features: Secondary | ICD-10-CM

## 2016-04-16 NOTE — Progress Notes (Signed)
  ASSESSMENT: Pt currently experiencing Major depressive disorder, without psychotic features, severe and recurrent. Pt needs to f/u with OB and [facility]. Pt would benefit from psychoeducation and brief therapeutic interventions regarding coping with symptoms of depression, as well as symptoms of anxiety and headaches.  Stage of Change: contemplative  PLAN: 1. F/U with behavioral health clinician in one month, or earlier, if symptoms increase 2. Psychiatric Medications: none 3. Behavioral recommendations:   -Listen to favorite gospel song daily -Begin taking prenatal vitamins daily, starting today -Do daily relaxation breathing exercise, after taking prenatal vitamin -Consider practicing daily relaxation breathing exercise with 8yo son, to practice calm together -Consider [facility] as resource to research med safety for postpartum -Read educational materials regarding coping with symptoms of depression, anxiety, and headaches -Continue with appointment for headache specialist  SUBJECTIVE: Pt. referred by Candelaria Celeste, DO, for symptoms of anxiety and depression Pt. reports the following symptoms/concerns: Pt reports an increase in feelings of depression and anxiety during current pregnancy(after stopping antidepressants), as well as postpartum with last birth eight years prior. Pt feeling anxious at thought she will feel PPD again; family and FOB supportive; pt coped best by thinking of her son last time and listening to favorite gospel song.  Duration of problem: Recent episode, over two weeks; first episode, eight years ago Severity: severe   OBJECTIVE: Orientation & Cognition: Oriented x3. Thought processes normal and appropriate to situation. Mood: appropriate Affect: appropriate Appearance: appropriate Risk of harm to self or others: no known risk of harm to self or others, no SI, no HI Substance use: none Assessments administered: (From yesterday:PHQ9: 21/GAD7: 19)  Diagnosis:  Major depressive disorder, severe, without psychotic features CPT Code: F33.2  -------------------------------------------- Other(s) present in the room: 8yo son  Time spent with patient in exam room: 55 minutes 3:10 to 4:05pm  Depression screen PHQ 2/9 04/15/2016  Decreased Interest 2  Down, Depressed, Hopeless 3  PHQ - 2 Score 5  Altered sleeping 3  Tired, decreased energy 3  Change in appetite 3  Feeling bad or failure about yourself  2  Trouble concentrating 3  Moving slowly or fidgety/restless 2  Suicidal thoughts 0  PHQ-9 Score 21   GAD 7 : Generalized Anxiety Score 04/15/2016  Nervous, Anxious, on Edge 3  Control/stop worrying 3  Worry too much - different things 3  Trouble relaxing 3  Restless 2  Easily annoyed or irritable 3  Afraid - awful might happen 2  Total GAD 7 Score 19

## 2016-04-20 ENCOUNTER — Other Ambulatory Visit: Payer: Self-pay | Admitting: General Practice

## 2016-04-20 DIAGNOSIS — O99519 Diseases of the respiratory system complicating pregnancy, unspecified trimester: Principal | ICD-10-CM

## 2016-04-20 DIAGNOSIS — J45909 Unspecified asthma, uncomplicated: Secondary | ICD-10-CM

## 2016-04-20 MED ORDER — FLUTICASONE PROPIONATE HFA 110 MCG/ACT IN AERO: 1.0000 | INHALATION_SPRAY | Freq: Two times a day (BID) | RESPIRATORY_TRACT | 12 refills | Status: DC

## 2016-04-21 MED ORDER — FLUTICASONE PROPIONATE HFA 110 MCG/ACT IN AERO: 1.0000 | INHALATION_SPRAY | Freq: Two times a day (BID) | RESPIRATORY_TRACT | 12 refills | Status: DC

## 2016-04-21 MED ORDER — BECLOMETHASONE DIPROPIONATE 40 MCG/ACT IN AERS: 1.0000 | INHALATION_SPRAY | Freq: Two times a day (BID) | RESPIRATORY_TRACT | 12 refills | Status: DC

## 2016-04-21 MED ORDER — BECLOMETHASONE DIPROPIONATE 40 MCG/ACT IN AERS
1.0000 | INHALATION_SPRAY | Freq: Two times a day (BID) | RESPIRATORY_TRACT | Status: DC
Start: 2016-04-21 — End: 2016-04-21

## 2016-04-22 ENCOUNTER — Encounter (HOSPITAL_COMMUNITY): Payer: Self-pay | Admitting: Family Medicine

## 2016-04-30 ENCOUNTER — Ambulatory Visit (HOSPITAL_COMMUNITY): Admission: RE | Admit: 2016-04-30 | Payer: Managed Care, Other (non HMO) | Source: Ambulatory Visit

## 2016-05-01 ENCOUNTER — Inpatient Hospital Stay (HOSPITAL_COMMUNITY)
Admission: AD | Admit: 2016-05-01 | Discharge: 2016-05-01 | Disposition: A | Discharge status: Home / Self Care | Payer: Managed Care, Other (non HMO) | Source: Ambulatory Visit | Attending: Family Medicine | Admitting: Family Medicine

## 2016-05-01 ENCOUNTER — Encounter (HOSPITAL_COMMUNITY): Payer: Self-pay | Admitting: *Deleted

## 2016-05-01 ENCOUNTER — Other Ambulatory Visit: Payer: Self-pay | Admitting: Family Medicine

## 2016-05-01 ENCOUNTER — Ambulatory Visit (HOSPITAL_COMMUNITY)
Admit: 2016-05-01 | Discharge: 2016-05-01 | Disposition: A | Discharge status: Home / Self Care | Payer: Managed Care, Other (non HMO) | Attending: Family Medicine | Admitting: Family Medicine

## 2016-05-01 DIAGNOSIS — W19XXXA Unspecified fall, initial encounter: Secondary | ICD-10-CM | POA: Insufficient documentation

## 2016-05-01 DIAGNOSIS — J45909 Unspecified asthma, uncomplicated: Secondary | ICD-10-CM | POA: Diagnosis not present

## 2016-05-01 DIAGNOSIS — F419 Anxiety disorder, unspecified: Secondary | ICD-10-CM | POA: Diagnosis not present

## 2016-05-01 DIAGNOSIS — O34219 Maternal care for unspecified type scar from previous cesarean delivery: Secondary | ICD-10-CM

## 2016-05-01 DIAGNOSIS — O99512 Diseases of the respiratory system complicating pregnancy, second trimester: Secondary | ICD-10-CM | POA: Diagnosis not present

## 2016-05-01 DIAGNOSIS — O99342 Other mental disorders complicating pregnancy, second trimester: Secondary | ICD-10-CM

## 2016-05-01 DIAGNOSIS — Z3689 Encounter for other specified antenatal screening: Secondary | ICD-10-CM

## 2016-05-01 DIAGNOSIS — M545 Low back pain: Secondary | ICD-10-CM | POA: Diagnosis not present

## 2016-05-01 DIAGNOSIS — O99212 Obesity complicating pregnancy, second trimester: Secondary | ICD-10-CM

## 2016-05-01 DIAGNOSIS — O9989 Other specified diseases and conditions complicating pregnancy, childbirth and the puerperium: Secondary | ICD-10-CM

## 2016-05-01 DIAGNOSIS — O26892 Other specified pregnancy related conditions, second trimester: Secondary | ICD-10-CM | POA: Diagnosis not present

## 2016-05-01 DIAGNOSIS — F329 Major depressive disorder, single episode, unspecified: Secondary | ICD-10-CM | POA: Insufficient documentation

## 2016-05-01 DIAGNOSIS — R1032 Left lower quadrant pain: Secondary | ICD-10-CM | POA: Diagnosis present

## 2016-05-01 DIAGNOSIS — Z3A18 18 weeks gestation of pregnancy: Secondary | ICD-10-CM

## 2016-05-01 DIAGNOSIS — Y92009 Unspecified place in unspecified non-institutional (private) residence as the place of occurrence of the external cause: Secondary | ICD-10-CM | POA: Diagnosis not present

## 2016-05-01 DIAGNOSIS — Z87891 Personal history of nicotine dependence: Secondary | ICD-10-CM | POA: Diagnosis not present

## 2016-05-01 DIAGNOSIS — Y92099 Unspecified place in other non-institutional residence as the place of occurrence of the external cause: Secondary | ICD-10-CM

## 2016-05-01 LAB — POCT FERN TEST: POCT Fern Test: NEGATIVE

## 2016-05-01 MED ORDER — CYCLOBENZAPRINE HCL 10 MG PO TABS
10.0000 mg | ORAL_TABLET | Freq: Once | ORAL | Status: AC
  Administered 2016-05-01: 10 mg via ORAL
  Filled 2016-05-01: qty 1

## 2016-05-01 MED ORDER — CYCLOBENZAPRINE HCL 10 MG PO TABS: 10.0000 mg | ORAL_TABLET | Freq: Two times a day (BID) | ORAL | 0 refills | Status: DC | PRN

## 2016-05-01 NOTE — MAU Note (Signed)
Patient presents s/p fall last night around 8:30 pm was backing up lost balance and landed on right side, no vaginal bleeding.

## 2016-05-01 NOTE — MAU Provider Note (Signed)
History     CSN: [id number]  Arrival date and time: 05/01/16 1248   First Provider Initiated Contact with Patient 05/01/16 1321      Chief Complaint  Patient presents with  . Fall   HPI [name] is a 30 y.o. G2P1001 at 4079w6d who presents to MAU today with complaint of fall at home this morning around 0800. The patient states that she was stepping backwards down a step and lost her balance. She landed on her right side. She denies head trauma, abdominal trauma or LOC. She denies vaginal bleeding, but is unsure about LOF. She states increasing LLQ abdominal pain and low back pain since fall.   OB History    Gravida Para Term Preterm AB Living   2 1 1     1    SAB TAB Ectopic Multiple Live Births           1      Past Medical History:  Diagnosis Date  . Abnormal Pap smear of cervix    2011?  Marland Kitchen. Allergy   . Anemia   . Anxiety   . Asthma    hospitalization 2008  . Chronic nausea   . Depression   . Dysmenorrhea   . Dyspareunia   . Migraines   . PID (pelvic inflammatory disease)   . Wears glasses     Past Surgical History:  Procedure Laterality Date  . CESAREAN SECTION    . implanon     inserted 2010  . WISDOM TOOTH EXTRACTION      Family History  Problem Relation Age of Onset  . Hypertension Mother   . Heart disease Mother     early 1040s  . Diabetes Mother   . Diabetes Paternal Grandmother   . Stroke Paternal Grandmother   . Other Father     unknown    Social History  Substance Use Topics  . Smoking status: Former Smoker    Quit date: 03/20/2008  . Smokeless tobacco: Never Used  . Alcohol use No    Allergies: No Known Allergies  Prescriptions Prior to Admission  Medication Sig Dispense Refill Last Dose  . albuterol (PROVENTIL HFA;VENTOLIN HFA) 108 (90 Base) MCG/ACT inhaler Inhale 2 puffs into the lungs every 6 (six) hours as needed for wheezing or shortness of breath. 1 Inhaler 2 05/01/2016 at Unknown time  . beclomethasone (QVAR) 40  MCG/ACT inhaler Inhale 1 puff into the lungs 2 (two) times daily. 1 Inhaler 12 04/30/2016 at Unknown time  . Prenatal Vit-Fe Fumarate-FA (MULTIVITAMIN-PRENATAL) 27-0.8 MG TABS tablet Take 1 tablet by mouth daily at 12 noon.   2 weeks    Review of Systems  Constitutional: Negative for malaise/fatigue.  Gastrointestinal: Positive for abdominal pain. Negative for nausea.  Genitourinary:       Neg - vaginal bleeding ? LOF  Neurological: Negative for dizziness, loss of consciousness and headaches.   Physical Exam   Blood pressure 108/67, pulse 91, temperature 98.2 F (36.8 C), temperature source Oral, resp. rate 18, height 5\' 2"  (1.575 m), weight 213 lb 1 oz (96.6 kg), last menstrual period 11/20/2015.  Physical Exam  Nursing note and vitals reviewed. Constitutional: She is oriented to person, place, and time. She appears well-developed and well-nourished. No distress.  HENT:  Head: Normocephalic and atraumatic.  Cardiovascular: Normal rate.   Respiratory: Effort normal.  GI: Soft. She exhibits no distension and no mass. There is tenderness (mild LLQ abdominal tenderness to palpation). There is no rebound  and no guarding.  Genitourinary: Uterus is enlarged. Uterus is not tender. Cervix exhibits no motion tenderness, no discharge and no friability. No bleeding in the vagina. Vaginal discharge (scant thin, white discharge noted, no pooling) found.  Neurological: She is alert and oriented to person, place, and time.  Skin: Skin is warm and dry. No erythema.  Psychiatric: She has a normal mood and affect.  Dilation: Closed Effacement (%): Thick Cervical Position: Posterior Exam by:: Magnus Sinning, PA  Results for orders placed or performed during the hospital encounter of 05/01/16 (from the past 24 hour(s))  Fern Test     Status: Normal   Collection Time: 05/01/16  1:39 PM  Result Value Ref Range   POCT Fern Test Negative = intact amniotic membranes     MAU Course   Procedures None  MDM FHR - 150 bpm with doppler Crist Fat - negative 10 mg Flexeril given for pain in MAU  Assessment and Plan  A: SIUP at [redacted]w[redacted]d Fall at home Abdominal pain in pregnancy, second trimester  P: Discharge home Rx for Flexeril given to patient Advised Tylenol PRN otherwise for pain Second trimester precautions discussed Patient advised to follow-up with [facility] as scheduled for routine prenatal care or sooner PRN Discussed reasons to return to MAU Patient may return to MAU as needed or if her condition were to change or worsen  Marny Lowenstein, PA-C  05/01/2016, 1:55 PM

## 2016-05-01 NOTE — Discharge Instructions (Signed)
What Do I Need to Know About Injuries During Pregnancy? °Injuries can happen during pregnancy. Minor falls and accidents usually do not harm you or your baby. However, any injury should be reported to your doctor. °WHAT CAN I DO TO PROTECT MYSELF FROM INJURIES? °· Remove rugs and loose objects on the floor. °· Wear comfortable shoes that have a good grip. Do not wear high-heeled shoes. °· Always wear your seat belt. The lap belt should be below your belly. Always practice safe driving. °· Do not ride on a motorcycle. °· Do not participate in high-impact activities or sports. °· Avoid: °¨ Walking on wet or slippery floors. °¨ Fires. °¨ Starting fires. °¨ Lifting heavy pots of boiling or hot liquids. °¨ Fixing electrical problems. °· Only take medicine as told by your doctor. °· Know your blood type and the blood type of the baby's father. °· Call your local emergency services (911 in the [place] if you are a victim of domestic violence or assault. For help and support, contact the National Domestic Violence Hotline. °WHEN SHOULD I GET HELP RIGHT AWAY? °· You fall on your belly or have any high-impact accident or injury. °· You have been a victim of domestic violence or any kind of violence. °· You have been in a car accident. °· You have bleeding from your vagina. °· Fluid is leaking from your vagina. °· You start to have belly cramping (contractions) or pain. °· You feel weak or pass out (faint). °· You start to throw up (vomit) after an injury. °· You have been burned. °· You have a stiff neck or neck pain. °· You get a headache or have vision problems after an injury. °· You do not feel the baby move or the baby is not moving as much as normal. °  °This information is not intended to replace advice given to you by your health care provider. Make sure you discuss any questions you have with your health care provider. °  °Document Released: 10/10/2010 Document Revised: 09/28/2014 Document Reviewed:  06/14/2013 °Elsevier Interactive Patient Education ©2016 Elsevier Inc. ° °

## 2016-05-06 ENCOUNTER — Ambulatory Visit (HOSPITAL_COMMUNITY): Payer: Managed Care, Other (non HMO)

## 2016-05-08 ENCOUNTER — Ambulatory Visit (INDEPENDENT_AMBULATORY_CARE_PROVIDER_SITE_OTHER): Payer: Managed Care, Other (non HMO) | Admitting: Physician Assistant

## 2016-05-08 ENCOUNTER — Encounter: Payer: Self-pay | Admitting: Physician Assistant

## 2016-05-08 VITALS — BP 119/81 | HR 103 | Ht 62.0 in | Wt 213.0 lb

## 2016-05-08 DIAGNOSIS — M62838 Other muscle spasm: Secondary | ICD-10-CM | POA: Diagnosis not present

## 2016-05-08 DIAGNOSIS — G43009 Migraine without aura, not intractable, without status migrainosus: Secondary | ICD-10-CM

## 2016-05-08 MED ORDER — METOCLOPRAMIDE HCL 10 MG PO TABS: 10.0000 mg | ORAL_TABLET | Freq: Three times a day (TID) | ORAL | 2 refills | Status: DC | PRN
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
## 2016-05-08 NOTE — Patient Instructions (Signed)

## 2016-05-15 ENCOUNTER — Telehealth: Payer: Self-pay | Admitting: *Deleted

## 2016-05-15 DIAGNOSIS — J4520 Mild intermittent asthma, uncomplicated: Secondary | ICD-10-CM

## 2016-05-15 MED ORDER — ALBUTEROL SULFATE HFA 108 (90 BASE) MCG/ACT IN AERS: 2.0000 | INHALATION_SPRAY | Freq: Four times a day (QID) | RESPIRATORY_TRACT | 2 refills | Status: DC | PRN

## 2016-05-15 NOTE — Telephone Encounter (Signed)
Vanessa Foster called yesterday afternoon and left a message that at her last appointment she was prescribed an inhaler but she was unable to pick it up. Wants to know if it can be re-prescribed so she can pick it up now.

## 2016-05-15 NOTE — Telephone Encounter (Signed)
Refill approved and sent to pharmacy.  Called Jameeka and unable to leave a message.   Pharmacy will notify pt.

## 2016-05-21 ENCOUNTER — Ambulatory Visit (INDEPENDENT_AMBULATORY_CARE_PROVIDER_SITE_OTHER): Payer: Managed Care, Other (non HMO) | Admitting: Obstetrics and Gynecology

## 2016-05-21 DIAGNOSIS — Z3482 Encounter for supervision of other normal pregnancy, second trimester: Secondary | ICD-10-CM | POA: Diagnosis not present

## 2016-05-21 LAB — POCT URINALYSIS DIP (DEVICE)
Bilirubin Urine: NEGATIVE
Glucose, UA: NEGATIVE mg/dL
Hgb urine dipstick: NEGATIVE
Ketones, ur: NEGATIVE mg/dL
Nitrite: NEGATIVE
Protein, ur: NEGATIVE mg/dL
Specific Gravity, Urine: >=1.03 (ref 1.005–1.030)
Urobilinogen, UA: 0.2 mg/dL (ref 0.0–1.0)
pH: 6 (ref 5.0–8.0)

## 2016-05-21 NOTE — Progress Notes (Signed)
   PRENATAL VISIT NOTE  Subjective:  Vanessa Foster is a 30 y.o. G2P1001 at 7229w5d being seen today for ongoing prenatal care.  She is currently monitored for the following issues for this low-risk pregnancy and has Hemorrhoids; Constipation; Daytime somnolence; Sleep disorder; Cephalalgia; Asthma, mild intermittent; Encounter for health maintenance examination in adult; Screening for condition; Need for prophylactic vaccination and inoculation against influenza; Depressed mood; Chronic nausea; Changing skin lesion; Adjustment disorder with mixed anxiety and depressed mood; Supervision of normal pregnancy, antepartum; History of cesarean delivery, antepartum; and Bacterial vaginosis on her problem list.  Patient reports heartburn.  Contractions: Irregular. Vag. Bleeding: None.  Movement: Present. Denies leaking of fluid.   The following portions of the patient's history were reviewed and updated as appropriate: allergies, current medications, past family history, past medical history, past social history, past surgical history and problem list. Problem list updated.  Objective:   Vitals:   05/21/16 1253  BP: 121/70  Pulse: (!) 106  Weight: 214 lb 3.2 oz (97.2 kg)    Fetal Status: Fetal Heart Rate (bpm): 152   Movement: Present     General:  Alert, oriented and cooperative. Patient is in no acute distress.  Skin: Skin is warm and dry. No rash noted.   Cardiovascular: Normal heart rate noted  Respiratory: Normal respiratory effort, no problems with respiration noted  Abdomen: Soft, gravid, appropriate for gestational age. Pain/Pressure: Present     Pelvic:  Cervical exam deferred        Extremities: Normal range of motion.  Edema: Trace  Mental Status: Normal mood and affect. Normal behavior. Normal judgment and thought content.   Urinalysis:     neg-protein, neg - glucose  Assessment and Plan:  Pregnancy: G2P1001 at 3029w5d  1. Supervision of normal pregnancy, antepartum, second  trimester -continue routine care. Recommended tums or zantac for heartburn symptoms.  -pt is having a boy and has opted for circumcision.   Preterm labor symptoms and general obstetric precautions including but not limited to vaginal bleeding, contractions, leaking of fluid and fetal movement were reviewed in detail with the patient. Please refer to After Visit Summary for other counseling recommendations.  Return in about 4 weeks (around 06/18/2016) for LOB.  Lorne SkeensNicholas Michael Schenk, MD

## 2016-05-21 NOTE — Progress Notes (Signed)
Oxygen sat 100%

## 2016-05-21 NOTE — Patient Instructions (Signed)
Breastfeeding Deciding to breastfeed is one of the best choices you can make for you and your baby. A change in hormones during pregnancy causes your breast tissue to grow and increases the number and size of your milk ducts. These hormones also allow proteins, sugars, and fats from your blood supply to make breast milk in your milk-producing glands. Hormones prevent breast milk from being released before your baby is born as well as prompt milk flow after birth. Once breastfeeding has begun, thoughts of your baby, as well as his or her sucking or crying, can stimulate the release of milk from your milk-producing glands.  BENEFITS OF BREASTFEEDING For Your Baby  Your first milk (colostrum) helps your baby's digestive system function better.  There are antibodies in your milk that help your baby fight off infections.  Your baby has a lower incidence of asthma, allergies, and sudden infant death syndrome.  The nutrients in breast milk are better for your baby than infant formulas and are designed uniquely for your baby's needs.  Breast milk improves your baby's brain development.  Your baby is less likely to develop other conditions, such as childhood obesity, asthma, or type 2 diabetes mellitus. For You  Breastfeeding helps to create a very special bond between you and your baby.  Breastfeeding is convenient. Breast milk is always available at the correct temperature and costs nothing.  Breastfeeding helps to burn calories and helps you lose the weight gained during pregnancy.  Breastfeeding makes your uterus contract to its prepregnancy size faster and slows bleeding (lochia) after you give birth.   Breastfeeding helps to lower your risk of developing type 2 diabetes mellitus, osteoporosis, and breast or ovarian cancer later in life. SIGNS THAT YOUR BABY IS HUNGRY Early Signs of Hunger  Increased alertness or activity.  Stretching.  Movement of the head from side to  side.  Movement of the head and opening of the mouth when the corner of the mouth or cheek is stroked (rooting).  Increased sucking sounds, smacking lips, cooing, sighing, or squeaking.  Hand-to-mouth movements.  Increased sucking of fingers or hands. Late Signs of Hunger  Fussing.  Intermittent crying. Extreme Signs of Hunger Signs of extreme hunger will require calming and consoling before your baby will be able to breastfeed successfully. Do not wait for the following signs of extreme hunger to occur before you initiate breastfeeding:  Restlessness.  A loud, strong cry.  Screaming. BREASTFEEDING BASICS Breastfeeding Initiation  Find a comfortable place to sit or lie down, with your neck and back well supported.  Place a pillow or rolled up blanket under your baby to bring him or her to the level of your breast (if you are seated). Nursing pillows are specially designed to help support your arms and your baby while you breastfeed.  Make sure that your baby's abdomen is facing your abdomen.  Gently massage your breast. With your fingertips, massage from your chest wall toward your nipple in a circular motion. This encourages milk flow. You may need to continue this action during the feeding if your milk flows slowly.  Support your breast with 4 fingers underneath and your thumb above your nipple. Make sure your fingers are well away from your nipple and your baby's mouth.  Stroke your baby's lips gently with your finger or nipple.  When your baby's mouth is open wide enough, quickly bring your baby to your breast, placing your entire nipple and as much of the colored area around your nipple (  areola) as possible into your baby's mouth.  More areola should be visible above your baby's upper lip than below the lower lip.  Your baby's tongue should be between his or her lower gum and your breast.  Ensure that your baby's mouth is correctly positioned around your nipple  (latched). Your baby's lips should create a seal on your breast and be turned out (everted).  It is common for your baby to suck about 2-3 minutes in order to start the flow of breast milk. Latching Teaching your baby how to latch on to your breast properly is very important. An improper latch can cause nipple pain and decreased milk supply for you and poor weight gain in your baby. Also, if your baby is not latched onto your nipple properly, he or she may swallow some air during feeding. This can make your baby fussy. Burping your baby when you switch breasts during the feeding can help to get rid of the air. However, teaching your baby to latch on properly is still the best way to prevent fussiness from swallowing air while breastfeeding. Signs that your baby has successfully latched on to your nipple:  Silent tugging or silent sucking, without causing you pain.  Swallowing heard between every 3-4 sucks.  Muscle movement above and in front of his or her ears while sucking. Signs that your baby has not successfully latched on to nipple:  Sucking sounds or smacking sounds from your baby while breastfeeding.  Nipple pain. If you think your baby has not latched on correctly, slip your finger into the corner of your baby's mouth to break the suction and place it between your baby's gums. Attempt breastfeeding initiation again. Signs of Successful Breastfeeding Signs from your baby:  A gradual decrease in the number of sucks or complete cessation of sucking.  Falling asleep.  Relaxation of his or her body.  Retention of a small amount of milk in his or her mouth.  Letting go of your breast by himself or herself. Signs from you:  Breasts that have increased in firmness, weight, and size 1-3 hours after feeding.  Breasts that are softer immediately after breastfeeding.  Increased milk volume, as well as a change in milk consistency and color by the fifth day of breastfeeding.  Nipples  that are not sore, cracked, or bleeding. Signs That Your Baby is Getting Enough Milk  Wetting at least 3 diapers in a 24-hour period. The urine should be clear and pale yellow by age 5 days.  At least 3 stools in a 24-hour period by age 5 days. The stool should be soft and yellow.  At least 3 stools in a 24-hour period by age 7 days. The stool should be seedy and yellow.  No loss of weight greater than 10% of birth weight during the first 3 days of age.  Average weight gain of 4-7 ounces (113-198 g) per week after age 4 days.  Consistent daily weight gain by age 5 days, without weight loss after the age of 2 weeks. After a feeding, your baby may spit up a small amount. This is common. BREASTFEEDING FREQUENCY AND DURATION Frequent feeding will help you make more milk and can prevent sore nipples and breast engorgement. Breastfeed when you feel the need to reduce the fullness of your breasts or when your baby shows signs of hunger. This is called "breastfeeding on demand." Avoid introducing a pacifier to your baby while you are working to establish breastfeeding (the first 4-6 weeks   after your baby is born). After this time you may choose to use a pacifier. Research has shown that pacifier use during the first year of a baby's life decreases the risk of sudden infant death syndrome (SIDS). Allow your baby to feed on each breast as long as he or she wants. Breastfeed until your baby is finished feeding. When your baby unlatches or falls asleep while feeding from the first breast, offer the second breast. Because newborns are often sleepy in the first few weeks of life, you may need to awaken your baby to get him or her to feed. Breastfeeding times will vary from baby to baby. However, the following rules can serve as a guide to help you ensure that your baby is properly fed:  Newborns (babies 4 weeks of age or younger) may breastfeed every 1-3 hours.  Newborns should not go longer than 3 hours  during the day or 5 hours during the night without breastfeeding.  You should breastfeed your baby a minimum of 8 times in a 24-hour period until you begin to introduce solid foods to your baby at around 6 months of age. BREAST MILK PUMPING Pumping and storing breast milk allows you to ensure that your baby is exclusively fed your breast milk, even at times when you are unable to breastfeed. This is especially important if you are going back to work while you are still breastfeeding or when you are not able to be present during feedings. Your lactation consultant can give you guidelines on how long it is safe to store breast milk. A breast pump is a machine that allows you to pump milk from your breast into a sterile bottle. The pumped breast milk can then be stored in a refrigerator or freezer. Some breast pumps are operated by hand, while others use electricity. Ask your lactation consultant which type will work best for you. Breast pumps can be purchased, but some hospitals and breastfeeding support groups lease breast pumps on a monthly basis. A lactation consultant can teach you how to hand express breast milk, if you prefer not to use a pump. CARING FOR YOUR BREASTS WHILE YOU BREASTFEED Nipples can become dry, cracked, and sore while breastfeeding. The following recommendations can help keep your breasts moisturized and healthy:  Avoid using soap on your nipples.  Wear a supportive bra. Although not required, special nursing bras and tank tops are designed to allow access to your breasts for breastfeeding without taking off your entire bra or top. Avoid wearing underwire-style bras or extremely tight bras.  Air dry your nipples for 3-4minutes after each feeding.  Use only cotton bra pads to absorb leaked breast milk. Leaking of breast milk between feedings is normal.  Use lanolin on your nipples after breastfeeding. Lanolin helps to maintain your skin's normal moisture barrier. If you use  pure lanolin, you do not need to wash it off before feeding your baby again. Pure lanolin is not toxic to your baby. You may also hand express a few drops of breast milk and gently massage that milk into your nipples and allow the milk to air dry. In the first few weeks after giving birth, some women experience extremely full breasts (engorgement). Engorgement can make your breasts feel heavy, warm, and tender to the touch. Engorgement peaks within 3-5 days after you give birth. The following recommendations can help ease engorgement:  Completely empty your breasts while breastfeeding or pumping. You may want to start by applying warm, moist heat (in   the shower or with warm water-soaked hand towels) just before feeding or pumping. This increases circulation and helps the milk flow. If your baby does not completely empty your breasts while breastfeeding, pump any extra milk after he or she is finished.  Wear a snug bra (nursing or regular) or tank top for 1-2 days to signal your body to slightly decrease milk production.  Apply ice packs to your breasts, unless this is too uncomfortable for you.  Make sure that your baby is latched on and positioned properly while breastfeeding. If engorgement persists after 48 hours of following these recommendations, contact your health care provider or a lactation consultant. OVERALL HEALTH CARE RECOMMENDATIONS WHILE BREASTFEEDING  Eat healthy foods. Alternate between meals and snacks, eating 3 of each per day. Because what you eat affects your breast milk, some of the foods may make your baby more irritable than usual. Avoid eating these foods if you are sure that they are negatively affecting your baby.  Drink milk, fruit juice, and water to satisfy your thirst (about 10 glasses a day).  Rest often, relax, and continue to take your prenatal vitamins to prevent fatigue, stress, and anemia.  Continue breast self-awareness checks.  Avoid chewing and smoking  tobacco. Chemicals from cigarettes that pass into breast milk and exposure to secondhand smoke may harm your baby.  Avoid alcohol and drug use, including marijuana. Some medicines that may be harmful to your baby can pass through breast milk. It is important to ask your health care provider before taking any medicine, including all over-the-counter and prescription medicine as well as vitamin and herbal supplements. It is possible to become pregnant while breastfeeding. If birth control is desired, ask your health care provider about options that will be safe for your baby. SEEK MEDICAL CARE IF:  You feel like you want to stop breastfeeding or have become frustrated with breastfeeding.  You have painful breasts or nipples.  Your nipples are cracked or bleeding.  Your breasts are red, tender, or warm.  You have a swollen area on either breast.  You have a fever or chills.  You have nausea or vomiting.  You have drainage other than breast milk from your nipples.  Your breasts do not become full before feedings by the fifth day after you give birth.  You feel sad and depressed.  Your baby is too sleepy to eat well.  Your baby is having trouble sleeping.   Your baby is wetting less than 3 diapers in a 24-hour period.  Your baby has less than 3 stools in a 24-hour period.  Your baby's skin or the white part of his or her eyes becomes yellow.   Your baby is not gaining weight by 5 days of age. SEEK IMMEDIATE MEDICAL CARE IF:  Your baby is overly tired (lethargic) and does not want to wake up and feed.  Your baby develops an unexplained fever.   This information is not intended to replace advice given to you by your health care provider. Make sure you discuss any questions you have with your health care provider.   Document Released: 09/07/2005 Document Revised: 05/29/2015 Document Reviewed: 03/01/2013 Elsevier Interactive Patient Education 2016 [employer]  

## 2016-06-01 ENCOUNTER — Telehealth: Payer: Self-pay | Admitting: *Deleted

## 2016-06-01 ENCOUNTER — Inpatient Hospital Stay (HOSPITAL_COMMUNITY)
Admission: AD | Admit: 2016-06-01 | Discharge: 2016-06-02 | Disposition: A | Discharge status: Home / Self Care | Payer: Managed Care, Other (non HMO) | Source: Ambulatory Visit | Attending: Family Medicine | Admitting: Family Medicine

## 2016-06-01 DIAGNOSIS — N760 Acute vaginitis: Secondary | ICD-10-CM

## 2016-06-01 DIAGNOSIS — Z87891 Personal history of nicotine dependence: Secondary | ICD-10-CM | POA: Insufficient documentation

## 2016-06-01 DIAGNOSIS — O99512 Diseases of the respiratory system complicating pregnancy, second trimester: Secondary | ICD-10-CM | POA: Insufficient documentation

## 2016-06-01 DIAGNOSIS — Z3492 Encounter for supervision of normal pregnancy, unspecified, second trimester: Secondary | ICD-10-CM

## 2016-06-01 DIAGNOSIS — B9689 Other specified bacterial agents as the cause of diseases classified elsewhere: Secondary | ICD-10-CM

## 2016-06-01 DIAGNOSIS — J45901 Unspecified asthma with (acute) exacerbation: Secondary | ICD-10-CM

## 2016-06-01 DIAGNOSIS — J45909 Unspecified asthma, uncomplicated: Secondary | ICD-10-CM | POA: Insufficient documentation

## 2016-06-01 DIAGNOSIS — O34219 Maternal care for unspecified type scar from previous cesarean delivery: Secondary | ICD-10-CM

## 2016-06-01 DIAGNOSIS — Z3A23 23 weeks gestation of pregnancy: Secondary | ICD-10-CM | POA: Insufficient documentation

## 2016-06-01 DIAGNOSIS — J4520 Mild intermittent asthma, uncomplicated: Secondary | ICD-10-CM

## 2016-06-01 NOTE — Telephone Encounter (Signed)
Opened in error

## 2016-06-01 NOTE — Telephone Encounter (Signed)
Ok

## 2016-06-01 NOTE — Telephone Encounter (Signed)
Received a voicemail from 05/29/16 afternoon from Bur[place]issa, Pregnancy care nurse statings Adream had some behavioral health issues noted on referral form and patient would like to see Asher MuirJamie , counselor at her next ob appt on 9/ 28/17. States she doesn't want medications, but would see counselor.  May call patient at [phone]

## 2016-06-02 ENCOUNTER — Telehealth: Payer: Self-pay | Admitting: Clinical

## 2016-06-02 ENCOUNTER — Encounter (HOSPITAL_COMMUNITY): Payer: Self-pay | Admitting: *Deleted

## 2016-06-02 DIAGNOSIS — Z3A23 23 weeks gestation of pregnancy: Secondary | ICD-10-CM

## 2016-06-02 DIAGNOSIS — O99512 Diseases of the respiratory system complicating pregnancy, second trimester: Secondary | ICD-10-CM

## 2016-06-02 DIAGNOSIS — Z87891 Personal history of nicotine dependence: Secondary | ICD-10-CM | POA: Diagnosis not present

## 2016-06-02 DIAGNOSIS — J45901 Unspecified asthma with (acute) exacerbation: Secondary | ICD-10-CM | POA: Diagnosis not present

## 2016-06-02 DIAGNOSIS — R0602 Shortness of breath: Secondary | ICD-10-CM | POA: Diagnosis present

## 2016-06-02 DIAGNOSIS — J45909 Unspecified asthma, uncomplicated: Secondary | ICD-10-CM | POA: Diagnosis not present

## 2016-06-02 MED ORDER — BECLOMETHASONE DIPROPIONATE 40 MCG/ACT IN AERS: 2.0000 | INHALATION_SPRAY | Freq: Two times a day (BID) | RESPIRATORY_TRACT | 11 refills | Status: DC

## 2016-06-02 MED ORDER — ALBUTEROL SULFATE HFA 108 (90 BASE) MCG/ACT IN AERS: 2.0000 | INHALATION_SPRAY | Freq: Four times a day (QID) | RESPIRATORY_TRACT | 3 refills | Status: DC | PRN

## 2016-06-02 MED ORDER — ALBUTEROL SULFATE (2.5 MG/3ML) 0.083% IN NEBU
2.5000 mg | INHALATION_SOLUTION | Freq: Once | RESPIRATORY_TRACT | Status: AC
  Administered 2016-06-02: 2.5 mg via RESPIRATORY_TRACT
  Filled 2016-06-02: qty 3

## 2016-06-02 NOTE — Telephone Encounter (Signed)
Called Ms. Torain as requested, to set up follow-up appointment with [facility] on 06-18-16 at 8:30am, and patient agrees with this plan.

## 2016-06-02 NOTE — MAU Note (Signed)
Pt reports shortness of breath and wheezing. States she thinks she is having an asthma attack. States she doesn't know where her inhaler is. Also reports tightening in her upper abd but states it isn't a contraction.

## 2016-06-02 NOTE — MAU Provider Note (Signed)
Chief Complaint:  SOB  First Provider Initiated Contact with Patient 06/02/16 0040      HPI: Vanessa Foster is a 30 y.o. G2P1001 with IUP at 2660w3d who presents to maternity admissions reporting SOB, chest tightness and wheezing this evening. She reports she was feeling mildly SOB prior to going to work tonight, then when she arrived at work there was some work being done on the carpet and her SOB got worse and started wheezing. She did not have an inhaler with her. Since then, symptoms improving, but still feels SOB. Denies any cough, congestion, rhinorrhea, fever or chills. Reports she has been using albuterol inhaler daily during pregnancy, but has been out for over a week. She was prescribed Qvar at last prenatal visit, but reports that this was not at pharmacy.   Denies contractions, leakage of fluid or vaginal bleeding. Good fetal movement.   Pregnancy Course: WOC - Asthma - Chronic headaches  Past Medical History: Past Medical History:  Diagnosis Date  . Abnormal Pap smear of cervix    2011?  Marland Kitchen. Allergy   . Anemia   . Anxiety   . Asthma    hospitalization 2008  . Chronic nausea   . Depression   . Dysmenorrhea   . Dyspareunia   . Migraines   . PID (pelvic inflammatory disease)   . Wears glasses     Past obstetric history: OB History  Gravida Para Term Preterm AB Living  2 1 1     1   SAB TAB Ectopic Multiple Live Births          1    # Outcome Date GA Lbr Len/2nd Weight Sex Delivery Anes PTL Lv  2 Current           1 Term 10/13/07 5450w0d   M CS-LTranv   LIV     Complications: Fetal Intolerance      Past Surgical History: Past Surgical History:  Procedure Laterality Date  . CESAREAN SECTION    . implanon     inserted 2010  . WISDOM TOOTH EXTRACTION       Family History: Family History  Problem Relation Age of Onset  . Hypertension Mother   . Heart disease Mother     early 4340s  . Diabetes Mother   . Diabetes Paternal Grandmother   . Stroke Paternal  Grandmother   . Other Father     unknown    Social History: Social History  Substance Use Topics  . Smoking status: Former Smoker    Quit date: 03/20/2008  . Smokeless tobacco: Never Used  . Alcohol use No    Allergies: No Known Allergies  Meds:  Prescriptions Prior to Admission  Medication Sig Dispense Refill Last Dose  . albuterol (PROVENTIL HFA;VENTOLIN HFA) 108 (90 Base) MCG/ACT inhaler Inhale 2 puffs into the lungs every 6 (six) hours as needed for wheezing or shortness of breath. 1 Inhaler 2 Past Month at Unknown time  . Prenatal Multivit-Min-Fe-FA (PRENATAL VITAMINS PO) Take 1 tablet by mouth daily.   06/01/2016 at Unknown time    I have reviewed patient's Past Medical Hx, Surgical Hx, Family Hx, Social Hx, medications and allergies.   ROS:  A comprehensive ROS was negative except per HPI.    Physical Exam   Patient Vitals for the past 24 hrs:  BP Temp Temp src Pulse Resp SpO2 Height Weight  06/02/16 0142 100/60 - - 94 17 - - -  06/02/16 0010 115/70 98.6 F (37  C) Oral 90 22 100 % 5\' 2"  (1.575 m) 218 lb (98.9 kg)   Constitutional: Well-developed, well-nourished female in no acute distress.  Cardiovascular: normal rate, regular rhythm  Respiratory: normal effort, no increased WOB. Lung sounds slightly diminished, but clear. No wheezes, rales or ronchi. GI: Abd soft, non-tender, gravid appropriate for gestational age. MS: Extremities nontender, no edema, normal ROM Neurologic: Alert and oriented x 4.  FHT: 145   Labs: No results found for this or any previous visit (from the past 24 hour(s)).  Imaging:  No results found.  MAU Course: - Patient given albuterol treatment with improvement in symptoms and air movemen  MDM: Plan of care reviewed with patient, including labs and tests ordered and medical treatment.  Assessment: Vanessa Foster is a 30 y.o. G2P1001 with IUP at [redacted]w[redacted]d who presents after a mild asthma attack. Pt stable, with no increased WOB,  saturating 100% on room air, and not wheezing. Slightly diminished air movement on exam, which improved with albuterol neb treatment.   Plan: Rx for QVAR and albuterol called into pharmacy Discussed precuations Discharge home in stable condition.       Medication List    TAKE these medications   albuterol 108 (90 Base) MCG/ACT inhaler Commonly known as:  PROVENTIL HFA;VENTOLIN HFA Inhale 2 puffs into the lungs every 6 (six) hours as needed for wheezing or shortness of breath.   beclomethasone 40 MCG/ACT inhaler Commonly known as:  QVAR Inhale 2 puffs into the lungs 2 (two) times daily.   PRENATAL VITAMINS PO Take 1 tablet by mouth daily.       Frederik Pear, MD 06/02/2016 12:55 AM  Midwife attestation:  I have seen and examined this patient; I agree with above documentation in the resident's note.   Vanessa Foster is a 30 y.o. G2P1001 reporting SOB +FM, denies LOF, VB, contractions, vaginal discharge.  PE: BP 100/60 (BP Location: Right Arm)   Pulse 94   Temp 98.6 F (37 C) (Oral)   Resp 17   Ht 5\' 2"  (1.575 m)   Wt 98.9 kg (218 lb)   LMP 11/20/2015 (LMP Unknown)   SpO2 100%   BMI 39.87 kg/m  Gen: calm comfortable, NAD Resp: normal effort, no distress Abd: gravid FHT: 145 bpm ROS, labs, PMH reviewed   A/P: [redacted] weeks gestation Asthma exacerbation Follow up as scheduled  Donette Larry, CNM  8:57 AM

## 2016-06-02 NOTE — Discharge Instructions (Signed)
Asthma Attack Prevention While you may not be able to control the fact that you have asthma, you can take actions to prevent asthma attacks. The best way to prevent asthma attacks is to maintain good control of your asthma. You can achieve this by:  Taking your medicines as directed.  Avoiding things that can irritate your airways or make your asthma symptoms worse (asthma triggers).  Keeping track of how well your asthma is controlled and of any changes in your symptoms.  Responding quickly to worsening asthma symptoms (asthma attack).  Seeking emergency care when it is needed. WHAT ARE SOME WAYS TO PREVENT AN ASTHMA ATTACK? Have a Plan Work with your health care provider to create a written plan for managing and treating your asthma attacks (asthma action plan). This plan includes:  A list of your asthma triggers and how you can avoid them.  Information on when medicines should be taken and when their dosages should be changed.  The use of a device that measures how well your lungs are working (peak flow meter). Monitor Your Asthma Use your peak flow meter and record your results in a journal every day. A drop in your peak flow numbers on one or more days may indicate the start of an asthma attack. This can happen even before you start to feel symptoms. You can prevent an asthma attack from getting worse by following the steps in your asthma action plan. Avoid Asthma Triggers Work with your asthma health care provider to find out what your asthma triggers are. This can be done by:  Allergy testing.  Keeping a journal that notes when asthma attacks occur and the factors that may have contributed to them.  Determining if there are other medical conditions that are making your asthma worse. Once you have determined your asthma triggers, take steps to avoid them. This may include avoiding excessive or prolonged exposure to:  Dust. Have someone dust and vacuum your home for you once or  twice a week. Using a high-efficiency particulate arrestance (HEPA) vacuum is best.  Smoke. This includes campfire smoke, forest fire smoke, and secondhand smoke from tobacco products.  Pet dander. Avoid contact with animals that you know you are allergic to.  Allergens from trees, grasses or pollens. Avoid spending a lot of time outdoors when pollen counts are high, and on very windy days.  Very cold, dry, or humid air.  Mold.  Foods that contain high amounts of sulfites.  Strong odors.  Outdoor air pollutants, such as engine exhaust.  Indoor air pollutants, such as aerosol sprays and fumes from household cleaners.  Household pests, including dust mites and cockroaches, and pest droppings.  Certain medicines, including NSAIDs. Always talk to your health care provider before stopping or starting any new medicines. Medicines Take over-the-counter and prescription medicines only as told by your health care provider. Many asthma attacks can be prevented by carefully following your medicine schedule. Taking your medicines correctly is especially important when you cannot avoid certain asthma triggers. Act Quickly If an asthma attack does happen, acting quickly can decrease how severe it is and how long it lasts. Take these steps:   Pay attention to your symptoms. If you are coughing, wheezing, or having difficulty breathing, do not wait to see if your symptoms go away on their own. Follow your asthma action plan.  If you have followed your asthma action plan and your symptoms are not improving, call your health care provider or seek immediate medical care   at the nearest hospital. It is important to note how often you need to use your fast-acting rescue inhaler. If you are using your rescue inhaler more often, it may mean that your asthma is not under control. Adjusting your asthma treatment plan may help you to prevent future asthma attacks and help you to gain better control of your  condition. HOW CAN I PREVENT AN ASTHMA ATTACK WHEN I EXERCISE? Follow advice from your health care provider about whether you should use your fast-acting inhaler before exercising. Many people with asthma experience exercise-induced bronchoconstriction (EIB). This condition often worsens during vigorous exercise in cold, humid, or dry environments. Usually, people with EIB can stay very active by pre-treating with a fast-acting inhaler before exercising.   This information is not intended to replace advice given to you by your health care provider. Make sure you discuss any questions you have with your health care provider.   Document Released: 08/26/2009 Document Revised: 05/29/2015 Document Reviewed: 02/07/2015 Elsevier Interactive Patient Education 2016 Elsevier Inc.  

## 2016-06-18 ENCOUNTER — Ambulatory Visit (INDEPENDENT_AMBULATORY_CARE_PROVIDER_SITE_OTHER): Payer: Managed Care, Other (non HMO) | Admitting: Clinical

## 2016-06-18 ENCOUNTER — Ambulatory Visit (INDEPENDENT_AMBULATORY_CARE_PROVIDER_SITE_OTHER): Payer: Managed Care, Other (non HMO) | Admitting: Advanced Practice Midwife

## 2016-06-18 VITALS — BP 104/69 | HR 101 | Wt 218.6 lb

## 2016-06-18 DIAGNOSIS — F332 Major depressive disorder, recurrent severe without psychotic features: Secondary | ICD-10-CM | POA: Diagnosis not present

## 2016-06-18 DIAGNOSIS — O1202 Gestational edema, second trimester: Secondary | ICD-10-CM

## 2016-06-18 DIAGNOSIS — Z3482 Encounter for supervision of other normal pregnancy, second trimester: Secondary | ICD-10-CM

## 2016-06-18 MED ORDER — COMFORT FIT MATERNITY SUPP MED MISC: 1.0000 | Freq: Every day | 0 refills | Status: DC

## 2016-06-18 MED ORDER — MEDICAL COMPRESSION STOCKINGS MISC: 1.0000 | Freq: Every day | 1 refills | Status: DC

## 2016-06-18 NOTE — BH Specialist Note (Signed)
  ASSESSMENT: Pt currently experiencing Major depressive disorder, without psychotic features, severe and recurrent. [facility] Pt would benefit from brief therapeutic intervention regarding coping with symptoms of depression.  Stage of Change: contemplative  PLAN: 1. F/U with behavioral health clinician in one month, or as needed 2. Psychiatric Medications: none 3. Behavioral recommendations:   -Continue with previous plan (favorite gospel song daily, prenatal vitamins daily, relaxation breathing daily)  SUBJECTIVE: Pt. f/u Pt. reports the following symptoms/concerns: Pt states that she has been feeling better since last visit, and does not want to take any BH meds during pregnancy, but might think about starting postpartum, if symptoms do not improve and/or get worse. Pt does not want to make any additional behavioral changes at this time. Duration of problem: Over two months Severity: severe   OBJECTIVE: Orientation & Cognition: Oriented x3. Thought processes normal and appropriate to situation. Mood: appropriate Affect: appropriate Appearance: appropriate Risk of harm to self or others: no known risk of harm to self or others Substance use: none Assessments administered: none today  Diagnosis: Major depressive disorder, severe, without psychotic features CPT Code: F33.2  -------------------------------------------- Other(s) present in the room: FOB   Time spent with patient in exam room: 16 minutes, 8:40-8:56am Visit number: 2   Functional Analysis Referred for: Increasing symptoms of depression and anxiety Patient level of concern, 1-10: no concern today Intervention: Motivational Interviewing   Depression screen [facility] 2/9 05/21/2016 04/15/2016  Decreased Interest 3 2  Down, Depressed, Hopeless 3 3  PHQ - 2 Score 6 5  Altered sleeping 3 3  Tired, decreased energy 3 3  Change in appetite 2 3  Feeling bad or failure about yourself  3 2  Trouble  concentrating 3 3  Moving slowly or fidgety/restless 2 2  Suicidal thoughts 1 0  PHQ-9 Score 23 21   GAD 7 : Generalized Anxiety Score 05/21/2016 04/15/2016  Nervous, Anxious, on Edge 3 3  Control/stop worrying 3 3  Worry too much - different things 3 3  Trouble relaxing 3 3  Restless 3 2  Easily annoyed or irritable 3 3  Afraid - awful might happen 3 2  Total GAD 7 Score 21 19

## 2016-06-18 NOTE — Progress Notes (Signed)
C/o pain in both legs.   Declines flu shot.

## 2016-06-18 NOTE — Patient Instructions (Signed)
Glucose Tolerance Test During Pregnancy The glucose tolerance test (GTT) is a blood test used to determine if you have developed a type of diabetes during pregnancy (gestational diabetes). This is when your body does not properly process sugar (glucose) in the food you eat, resulting in high blood glucose levels. Typically, a GTT is done after you have had a 1-hour glucose test with results that indicate you possibly have gestational diabetes. It may also be done if:  You have a history of giving birth to very large babies or have experienced repeated fetal loss (stillbirth).   You have signs and symptoms of diabetes, such as:   Changes in your vision.   Tingling or numbness in your hands or feet.   Changes in hunger, thirst, and urination not otherwise explained by your pregnancy.  The GTT lasts about 3 hours. You will be given a sugar-water solution to drink at the beginning of the test. You will have blood drawn before you drink the solution and then again 1, 2, and 3 hours after you drink it. You will not be allowed to eat or drink anything else during the test. You must remain at the testing location to make sure that your blood is drawn on time. You should also avoid exercising during the test, because exercise can alter test results. PREPARATION FOR TEST  Eat normally for 3 days prior to the GTT test, including having plenty of carbohydrate-rich foods. Do not eat or drink anything except water during the final 12 hours before the test. In addition, your health care provider may ask you to stop taking certain medicines before the test. RESULTS  It is your responsibility to obtain your test results. Ask the lab or department performing the test when and how you will get your results. Contact your health care provider to discuss any questions you have about your results.  Range of Normal Values Ranges for normal values may vary among different labs and hospitals. You should always check  with your health care provider after having lab work or other tests done to discuss whether your values are considered within normal limits. Normal levels of blood glucose are as follows:  Fasting: less than 105 mg/dL.   1 hour after drinking the solution: less than 190 mg/dL.   2 hours after drinking the solution: less than 165 mg/dL.   3 hours after drinking the solution: less than 145 mg/dL.  Some substances can interfere with GTT results. These may include:  Blood pressure and heart failure medicines, including beta blockers, furosemide, and thiazides.   Anti-inflammatory medicines, including aspirin.   Nicotine.   Some psychiatric medicines.  Meaning of Results Outside Normal Value Ranges GTT test results that are above normal values may indicate a number of health problems, such as:   Gestational diabetes.   Acute stress response.   Cushing syndrome.   Tumors such as pheochromocytoma or glucagonoma.   Long-term kidney problems.   Pancreatitis.   Hyperthyroidism.   Current infection.  Discuss your test results with your health care provider. He or she will use the results to make a diagnosis and determine a treatment plan that is right for you.   This information is not intended to replace advice given to you by your health care provider. Make sure you discuss any questions you have with your health care provider.   Document Released: 03/08/2012 Document Revised: 09/28/2014 Document Reviewed: 01/12/2014 Elsevier Interactive Patient Education 2016 Elsevier Inc.  

## 2016-06-24 NOTE — Progress Notes (Signed)
   PRENATAL VISIT NOTE  Subjective:  Vanessa Foster is a 30 y.o. G2P1001 at 7669w5d being seen today for ongoing prenatal care.  She is currently monitored for the following issues for this high-risk pregnancy and has Hemorrhoids; Constipation; Daytime somnolence; Sleep disorder; Cephalalgia; Asthma, mild intermittent; Encounter for health maintenance examination in adult; Screening for condition; Need for prophylactic vaccination and inoculation against influenza; Depressed mood; Chronic nausea; Changing skin lesion; Adjustment disorder with mixed anxiety and depressed mood; Supervision of normal pregnancy, antepartum; History of cesarean delivery, antepartum; and Bacterial vaginosis on her problem list.  Patient reports Pain and swelling all over both legs. Denies shortness of breath, calf tenderness or warmth. Pain is worse after prolonged standing.  Contractions: Irregular. Vag. Bleeding: None.  Movement: Present. Denies leaking of fluid.   The following portions of the patient's history were reviewed and updated as appropriate: allergies, current medications, past family history, past medical history, past social history, past surgical history and problem list. Problem list updated.  Objective:   Vitals:   06/18/16 0816  BP: 104/69  Pulse: (!) 101  Weight: 218 lb 9.6 oz (99.2 kg)    Fetal Status: Fetal Heart Rate (bpm): 140 Fundal Height: 30 cm Movement: Present     General:  Alert, oriented and cooperative. Patient is in no acute distress.  Skin: Skin is warm and dry. No rash noted.   Cardiovascular: Normal heart rate noted  Respiratory: Normal respiratory effort, no problems with respiration noted  Abdomen: Soft, gravid, appropriate for gestational age. Pain/Pressure: Present     Pelvic:  Cervical exam deferred        Extremities: Normal range of motion.  Edema: Trace edema in bilateral lower extremities from knee down. Negative Homans sign.   Mental Status: Normal mood and  affect. Normal behavior. Normal judgment and thought content.   Urinalysis:      Assessment and Plan:  Pregnancy: G2P1001 at 3318w4d  1. Supervision of normal pregnancy, antepartum, second trimester  - Elastic Bandages & Supports (COMFORT FIT MATERNITY SUPP MED) MISC; 1 Device by Does not apply route daily.  Dispense: 1 each; Refill: 0  2. Edema in pregnancy, second trimester  - Elastic Bandages & Supports (MEDICAL COMPRESSION STOCKINGS) MISC; 1 Device by Does not apply route daily.  Dispense: 1 each; Refill: 1 - Patient encouraged to elevate legs whenever possible, increase fluids and decreased sodium. - DVT precautions.  Preterm labor symptoms and general obstetric precautions including but not limited to vaginal bleeding, contractions, leaking of fluid and fetal movement were reviewed in detail with the patient. Please refer to After Visit Summary for other counseling recommendations.  Return for ROB/3 hour GTT. recommended going straight to 3 hour GTT due to elevated early 1 hour GCT.  Dorathy KinsmanVirginia Cleophas Foster, CNM

## 2016-06-25 NOTE — Telephone Encounter (Signed)
Per chart review patient saw Gabryel. 06/18/16.

## 2016-06-26 ENCOUNTER — Other Ambulatory Visit: Payer: Managed Care, Other (non HMO)

## 2016-06-26 DIAGNOSIS — O99814 Abnormal glucose complicating childbirth: Secondary | ICD-10-CM

## 2016-06-27 LAB — GLUCOSE TOLERANCE, 3 HOURS
Glucose Tolerance, 1 hour: 217 mg/dL — ABNORMAL HIGH (ref <190)
Glucose Tolerance, 2 hour: 186 mg/dL — ABNORMAL HIGH (ref <165)
Glucose Tolerance, Fasting: 98 mg/dL (ref 65–104)
Glucose, GTT - 3 Hour: 157 mg/dL — ABNORMAL HIGH (ref <145)

## 2016-06-30 ENCOUNTER — Telehealth: Payer: Self-pay | Admitting: *Deleted

## 2016-06-30 NOTE — Telephone Encounter (Signed)
Called pt and left message stating that I am calling with test results. Please call back and leave a message stating whether a detailed message can be left on her voice mail.  Pt had abnormal 3hr GTT and needs appt with Maggie. Currently there is appt available @ [facility] on 10/12 @ 1330. This will need to be scheduled if pt is available. If she is not, she will need appt in our office on 10/16 with Maggie.

## 2016-07-01 NOTE — Telephone Encounter (Signed)
Patient called and left message stating she is returning our call for test results. She knows it is about her blood sugar because she can see it in mychart. Called patient, no answer- left message stating we are trying to reach her but will send her a mychart message with the information she needs.

## 2016-07-02 ENCOUNTER — Encounter: Payer: Self-pay | Admitting: Obstetrics and Gynecology

## 2016-07-08 ENCOUNTER — Encounter: Payer: Self-pay | Admitting: Obstetrics & Gynecology

## 2016-07-13 ENCOUNTER — Other Ambulatory Visit: Payer: Self-pay

## 2016-07-13 ENCOUNTER — Encounter: Payer: Managed Care, Other (non HMO) | Attending: Family Medicine | Admitting: Dietician

## 2016-07-13 ENCOUNTER — Other Ambulatory Visit: Payer: Self-pay | Admitting: *Deleted

## 2016-07-13 ENCOUNTER — Ambulatory Visit: Payer: Managed Care, Other (non HMO) | Admitting: *Deleted

## 2016-07-13 DIAGNOSIS — O24419 Gestational diabetes mellitus in pregnancy, unspecified control: Secondary | ICD-10-CM

## 2016-07-13 HISTORY — DX: Gestational diabetes mellitus in pregnancy, unspecified control: O24.419

## 2016-07-13 MED ORDER — ACCU-CHEK AVIVA PLUS W/DEVICE KIT: 1.0000 | PACK | Freq: Once | 0 refills | Status: AC

## 2016-07-13 MED ORDER — ACCU-CHEK SOFT TOUCH LANCETS MISC: 12 refills | Status: DC

## 2016-07-13 MED ORDER — GLUCOSE BLOOD VI STRP: ORAL_STRIP | 12 refills | Status: DC

## 2016-07-13 NOTE — Progress Notes (Signed)
Diabetes Education: 07/13/2016 Vanessa "Ree" is a 30 Y/O G2P1 lady at 4724w2d, with an EDD: 09/26/2016. No previous HX of GDM, but her maternal grandmother has a H/X of type 2 diabetes.   Review of GDM and implications for the health of the mother and her baby.  Review of the postpartum measures for prevention of developing type 2 diabetes later in life. Completed review of factors affecting blood glucose. Review of use of exercise/activity for lowering blood glucose.  Advised to walk 30 minutes daily.  If glucose tends to run higher, check dietary carb levels and consider walking 25 minutes after each meal.  Can walk in place in her home.. Completed review of carbohydrate foods, implications for blood glucose, carb portions, carb counting, meal planning, meal and snack carb counts. Provided handout "Nutrithon, Diabetes, and Pregnancy".   Review of blood glucose monitoring.  Review of the Medicaid Accu-Check [employer] Preferred meter.  On random check her glucose following breakfast  this morning was 143 mg/dl.  Instructed to monitor fasting and 2 hr post prandial blood glucose, record and bring meter and glucose log to all clinic appointments. Will plan to f/u as needed. Maggie May, RN, RD, LDN

## 2016-07-14 ENCOUNTER — Encounter: Payer: Self-pay | Admitting: Obstetrics & Gynecology

## 2016-07-20 ENCOUNTER — Encounter (HOSPITAL_COMMUNITY): Payer: Self-pay

## 2016-07-20 ENCOUNTER — Emergency Department (HOSPITAL_COMMUNITY)
Admission: EM | Admit: 2016-07-20 | Discharge: 2016-07-20 | Disposition: A | Discharge status: Home / Self Care | Payer: Managed Care, Other (non HMO) | Attending: Emergency Medicine | Admitting: Emergency Medicine

## 2016-07-20 DIAGNOSIS — Z3A32 32 weeks gestation of pregnancy: Secondary | ICD-10-CM | POA: Insufficient documentation

## 2016-07-20 DIAGNOSIS — O24419 Gestational diabetes mellitus in pregnancy, unspecified control: Secondary | ICD-10-CM | POA: Insufficient documentation

## 2016-07-20 DIAGNOSIS — R059 Cough, unspecified: Secondary | ICD-10-CM

## 2016-07-20 DIAGNOSIS — R05 Cough: Secondary | ICD-10-CM | POA: Diagnosis not present

## 2016-07-20 DIAGNOSIS — J45909 Unspecified asthma, uncomplicated: Secondary | ICD-10-CM | POA: Diagnosis not present

## 2016-07-20 DIAGNOSIS — Z87891 Personal history of nicotine dependence: Secondary | ICD-10-CM | POA: Insufficient documentation

## 2016-07-20 DIAGNOSIS — O9989 Other specified diseases and conditions complicating pregnancy, childbirth and the puerperium: Secondary | ICD-10-CM | POA: Diagnosis present

## 2016-07-20 DIAGNOSIS — R0981 Nasal congestion: Secondary | ICD-10-CM | POA: Diagnosis not present

## 2016-07-20 LAB — CBG MONITORING, ED: Glucose-Capillary: 98 mg/dL (ref 65–99)

## 2016-07-20 MED ORDER — AZITHROMYCIN 250 MG PO TABS: 250.0000 mg | ORAL_TABLET | Freq: Every day | ORAL | 0 refills | Status: DC

## 2016-07-20 NOTE — ED Provider Notes (Signed)
[facility]-EMERGENCY DEPT Provider Note   CSN: [id number] Arrival date & time: 07/20/16  1205     History   Chief Complaint Chief Complaint  Patient presents with  . Cough  . Nasal Congestion    HPI Vanessa Foster is a 30 y.o. female.  The history is provided by the patient and medical records.  Cough     30 y.o. F G2P1 approx [redacted] wks gestation, presenting to the ED for cough and nasal congestion.  Patient states since Friday she has been having nasal congestion and cough that is productive of thick, gray colored mucus. States cough seems worse at night when lying flat. She reports a fever on Friday, none in the past 24 hours.  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 She denies any pregnancy complaints, specifically no abdominal pain, vaginal bleeding, loss of fluid, or sensation of contractions. States she is feeling fetal kicks regularly. No nausea or vomiting. States she has been receiving appropriate prenatal care up until this point.  Past Medical History:  Diagnosis Date  . Abnormal Pap smear of cervix    2011?  [facility]   . Anemia   . Anxiety   . Asthma    hospitalization 2008  . Chronic nausea   . Depression   . Dysmenorrhea   . Dyspareunia   . Gestational diabetes 07/13/2016  . Migraines   . PID (pelvic inflammatory disease)   . Wears glasses     Patient Active Problem List   Diagnosis Date Noted  . Gestational diabetes 07/13/2016  . Bacterial vaginosis 03/21/2016  . Supervision of normal pregnancy, antepartum 03/18/2016  . History of cesarean delivery, antepartum 03/18/2016  . Daytime somnolence 07/11/2015  . Sleep disorder 07/11/2015  . Cephalalgia 07/11/2015  . Asthma, mild intermittent 07/11/2015  . Encounter for health maintenance examination in adult 07/11/2015  . Screening for condition 07/11/2015  . Need for prophylactic vaccination and inoculation against influenza 07/11/2015  . Depressed  mood 07/11/2015  . Chronic nausea 07/11/2015  . Changing skin lesion 07/11/2015  . Adjustment disorder with mixed anxiety and depressed mood 07/11/2015  . Hemorrhoids 04/30/2014  . Constipation 04/30/2014    Past Surgical History:  Procedure Laterality Date  . CESAREAN SECTION    . implanon     inserted 2010  . WISDOM TOOTH EXTRACTION      OB History    Gravida Para Term Preterm AB Living   2 1 1     1    SAB TAB Ectopic Multiple Live Births           1       Home Medications    Prior to Admission medications   Medication Sig Start Date End Date Taking? Authorizing Provider  albuterol (PROVENTIL HFA;VENTOLIN HFA) 108 (90 Base) MCG/ACT inhaler Inhale 2 puffs into the lungs every 6 (six) hours as needed for wheezing or shortness of breath. 06/02/16   Kandra Nicolas Degele, MD  beclomethasone (QVAR) 40 MCG/ACT inhaler Inhale 2 puffs into the lungs 2 (two) times daily. 06/02/16   Frederik Pear, MD  Elastic Bandages & Supports (COMFORT FIT MATERNITY SUPP MED) MISC 1 Device by Does not apply route daily. 06/18/16   Dorathy Kinsman, CNM  Elastic Bandages & Supports (MEDICAL COMPRESSION STOCKINGS) MISC 1 Device by Does not apply route daily. 06/18/16   Dorathy Kinsman, CNM  glucose blood (ACCU-CHEK AVIVA) test strip Use as instructed 07/13/16  Hermina StaggersMichael L Ervin, MD  Lancets (ACCU-CHEK SOFT TOUCH) lancets Use as instructed 07/13/16   Hermina StaggersMichael L Ervin, MD  Prenatal Multivit-Min-Fe-FA (PRENATAL VITAMINS PO) Take 1 tablet by mouth daily.    Historical Provider, MD    Family History Family History  Problem Relation Age of Onset  . Hypertension Mother   . Heart disease Mother     early 3140s  . Diabetes Mother   . Diabetes Paternal Grandmother   . Stroke Paternal Grandmother   . Other Father     unknown    Social History Social History  Substance Use Topics  . Smoking status: Former Smoker    Quit date: 03/20/2008  . Smokeless tobacco: Never Used  . Alcohol use No     Allergies   Review  of patient's allergies indicates no known allergies.   Review of Systems Review of Systems  HENT: Positive for congestion and sinus pressure.   Respiratory: Positive for cough.   All other systems reviewed and are negative.    Physical Exam Updated Vital Signs BP 135/87 (BP Location: Left Arm)   Pulse 99   Temp 97.4 F (36.3 C) (Oral)   Resp 18   Ht 5\' 2"  (1.575 m)   Wt 98 kg   LMP 11/20/2015 (LMP Unknown)   SpO2 100%   BMI 39.51 kg/m   Physical Exam  Constitutional: She is oriented to person, place, and time. She appears well-developed and well-nourished.  HENT:  Head: Normocephalic and atraumatic.  Right Ear: Tympanic membrane normal.  Left Ear: Tympanic membrane normal.  Nose: Mucosal edema present.  Mouth/Throat: Oropharynx is clear and moist.  +nasal congestion Tonsils overall normal in appearance bilaterally without exudate; uvula midline without evidence of peritonsillar abscess; handling secretions appropriately; no difficulty swallowing or speaking; normal phonation without stridor  Eyes: Conjunctivae and EOM are normal. Pupils are equal, round, and reactive to light.  Neck: Normal range of motion.  Cardiovascular: Normal rate, regular rhythm and normal heart sounds.   Pulmonary/Chest: Effort normal and breath sounds normal.  Lung sounds overall clear, no distress, speaking in full sentences without difficulty  Abdominal: Soft. Bowel sounds are normal.  Gravid abdomen, FHR around 145 on doppler  Musculoskeletal: Normal range of motion.  Neurological: She is alert and oriented to person, place, and time.  Skin: Skin is warm and dry.  Psychiatric: She has a normal mood and affect.  Nursing note and vitals reviewed.    ED Treatments / Results  Labs (all labs ordered are listed, but only abnormal results are displayed) Labs Reviewed  CBG MONITORING, ED    EKG  EKG Interpretation None       Radiology No results found.  Procedures Procedures  (including critical care time)  Medications Ordered in ED Medications - No data to display   Initial Impression / Assessment and Plan / ED Course  I have reviewed the triage vital signs and the nursing notes.  Pertinent labs & imaging results that were available during my care of the patient were reviewed by me and considered in my medical decision making (see chart for details).  Clinical Course   30 year old female here with cough and nasal congestion. She is afebrile and nontoxic here. Her lung sounds are overall clear without wheezes or rhonchi. She is in no acute respiratory distress. Her vital signs are stable.  She is [redacted] weeks gestation, No fetal issues, FHR stable at 145.  No bleeding, loss of fluids, etc. 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 Will cover with azithromycin for possible treatment of community acquired pneumonia. Chest x-ray was deferred due to risk of radiation, patient acknowledged understanding of this. I recommended that she continue eating and drinking normally, rest when possible. She is to follow-up with her primary care doctor.  Discussed plan with patient, she acknowledged understanding and agreed with plan of care.  Return precautions given for new or worsening symptoms.  Final Clinical Impressions(s) / ED Diagnoses   Final diagnoses:  Cough  Nasal congestion    New Prescriptions Discharge Medication List as of 07/20/2016 12:58 PM    START taking these medications   Details  azithromycin (ZITHROMAX) 250 MG tablet Take 1 tablet (250 mg total) by mouth daily. Take first 2 tablets together, then 1 every day until finished., Starting Mon 07/20/2016, Print         Garlon HatchetLisa M Masaki Rothbauer, PA-C 07/20/16 1538    Maia PlanJoshua G Long, MD 07/20/16 2008

## 2016-07-20 NOTE — Discharge Instructions (Signed)
Take the prescribed medication as directed. °Make sure to rest and drink fluids to stay hydrated. °Follow-up with your primary care doctor. °Return to the ED for new or worsening symptoms. °

## 2016-07-20 NOTE — ED Triage Notes (Signed)
Patient complains of congestion with cough with thick mucus since Friday. No pregnancy complaints, normal fetal movement. No resp distress. G2, P1

## 2016-07-22 ENCOUNTER — Ambulatory Visit (INDEPENDENT_AMBULATORY_CARE_PROVIDER_SITE_OTHER): Payer: Managed Care, Other (non HMO) | Admitting: Obstetrics & Gynecology

## 2016-07-22 ENCOUNTER — Ambulatory Visit (INDEPENDENT_AMBULATORY_CARE_PROVIDER_SITE_OTHER): Payer: Medicaid Other | Admitting: Clinical

## 2016-07-22 VITALS — BP 116/76 | HR 103 | Temp 98.7°F | Wt 214.2 lb

## 2016-07-22 DIAGNOSIS — Z362 Encounter for other antenatal screening follow-up: Secondary | ICD-10-CM

## 2016-07-22 DIAGNOSIS — IMO0002 Reserved for concepts with insufficient information to code with codable children: Secondary | ICD-10-CM

## 2016-07-22 DIAGNOSIS — O24419 Gestational diabetes mellitus in pregnancy, unspecified control: Secondary | ICD-10-CM

## 2016-07-22 DIAGNOSIS — B9689 Other specified bacterial agents as the cause of diseases classified elsewhere: Secondary | ICD-10-CM

## 2016-07-22 DIAGNOSIS — O0993 Supervision of high risk pregnancy, unspecified, third trimester: Secondary | ICD-10-CM

## 2016-07-22 DIAGNOSIS — Z23 Encounter for immunization: Secondary | ICD-10-CM | POA: Diagnosis not present

## 2016-07-22 DIAGNOSIS — N760 Acute vaginitis: Secondary | ICD-10-CM

## 2016-07-22 DIAGNOSIS — O26893 Other specified pregnancy related conditions, third trimester: Secondary | ICD-10-CM

## 2016-07-22 DIAGNOSIS — F332 Major depressive disorder, recurrent severe without psychotic features: Secondary | ICD-10-CM

## 2016-07-22 DIAGNOSIS — N898 Other specified noninflammatory disorders of vagina: Secondary | ICD-10-CM

## 2016-07-22 DIAGNOSIS — Z0489 Encounter for examination and observation for other specified reasons: Secondary | ICD-10-CM

## 2016-07-22 LAB — CBC
HCT: 32.3 % — ABNORMAL LOW (ref 35.0–45.0)
Hemoglobin: 10.4 g/dL — ABNORMAL LOW (ref 11.7–15.5)
MCH: 25.2 pg — ABNORMAL LOW (ref 27.0–33.0)
MCHC: 32.2 g/dL (ref 32.0–36.0)
MCV: 78.4 fL — ABNORMAL LOW (ref 80.0–100.0)
MPV: 9.4 fL (ref 7.5–12.5)
Platelets: 247 10*3/uL (ref 140–400)
RBC: 4.12 MIL/uL (ref 3.80–5.10)
RDW: 15.3 % — ABNORMAL HIGH (ref 11.0–15.0)
WBC: 11.9 10*3/uL — ABNORMAL HIGH (ref 3.8–10.8)

## 2016-07-22 LAB — POCT URINALYSIS DIP (DEVICE)
Glucose, UA: NEGATIVE mg/dL
Ketones, ur: NEGATIVE mg/dL
Nitrite: NEGATIVE
Protein, ur: 30 mg/dL — AB
Specific Gravity, Urine: 1.025 (ref 1.005–1.030)
Urobilinogen, UA: 0.2 mg/dL (ref 0.0–1.0)
pH: 6 (ref 5.0–8.0)

## 2016-07-22 MED ORDER — GLUCOSE BLOOD VI STRP: ORAL_STRIP | 12 refills | Status: DC

## 2016-07-22 MED ORDER — ACCU-CHEK FASTCLIX LANCETS MISC: 1.0000 [IU] | Freq: Four times a day (QID) | 12 refills | Status: DC

## 2016-07-22 MED ORDER — ACCU-CHEK SOFT TOUCH LANCETS MISC: 12 refills | Status: DC

## 2016-07-22 NOTE — Patient Instructions (Signed)
Return to clinic for any scheduled appointments or obstetric concerns, or go to [facility] for evaluation  

## 2016-07-22 NOTE — Addendum Note (Signed)
Addended by: Garret ReddishBARNES, Sadie Pickar M on: 07/22/2016 04:44 PM   Modules accepted: Orders

## 2016-07-22 NOTE — Progress Notes (Signed)
   PRENATAL VISIT NOTE  Subjective:  Vanessa Foster is a 30 y.o. G2P1001 at 8656w4d being seen today for ongoing prenatal care.  She is currently monitored for the following issues for this high-risk pregnancy and has Hemorrhoids; Constipation; Daytime somnolence; Sleep disorder; Cephalalgia; Asthma, mild intermittent; Screening for condition; Need for prophylactic vaccination and inoculation against influenza; Depressed mood; Chronic nausea; Changing skin lesion; Adjustment disorder with mixed anxiety and depressed mood; Supervision of high-risk pregnancy; History of cesarean delivery, antepartum; Bacterial vaginosis; and Gestational diabetes on her problem list.  Patient missed a few appointments. Has not been checking blood sugars as her strips and lancets were not covered by her insurance. She also reports some leaking noted after couging, had a recent URI. Unsure if it is just urine.  Contractions: Irregular. Vag. Bleeding: None.  Movement: Present.   The following portions of the patient's history were reviewed and updated as appropriate: allergies, current medications, past family history, past medical history, past social history, past surgical history and problem list. Problem list updated.  Objective:   Vitals:   07/22/16 1602  BP: 116/76  Pulse: (!) 103  Temp: 98.7 F (37.1 C)  Weight: 214 lb 3.2 oz (97.2 kg)    Fetal Status: Fetal Heart Rate (bpm): 142 Fundal Height: 34 cm Movement: Present     General:  Alert, oriented and cooperative. Patient is in no acute distress.  Skin: Skin is warm and dry. No rash noted.   Cardiovascular: Normal heart rate noted  Respiratory: Normal respiratory effort, no problems with respiration noted  Abdomen: Soft, gravid, appropriate for gestational age. Pain/Pressure: Absent     Pelvic:  NEFG. Normal vagina, no pooling seen. Small amount of thin, white, malodorous vaginal discharge seen, wet prep sample obtained.  Extremities: Normal range  of motion.  Edema: None  Mental Status: Normal mood and affect. Normal behavior. Normal judgment and thought content.   Assessment and Plan:  Pregnancy: G2P1001 at 156w4d  1. Gestational diabetes mellitus (GDM) in second trimester, gestational diabetes method of control unspecified Prescribed proper lancets and strips, patient advised to check BS as directed. Will follow up in one week. HgA1C checked with third trimester labs.  - glucose blood (ACCU-CHEK SMARTVIEW) test strip; Use as instructed to check blood sugars  Dispense: 100 each; Refill: 12 - ACCU-CHEK FASTCLIX LANCETS MISC; 1 Units by Percutaneous route 4 (four) times daily.  Dispense: 100 each; Refill: 12 - RPR - HIV antibody - CBC - Hemoglobin A1c - US MFM OB FOLLOW UP; Future  2. Evaluate anatomy not seen on prior sonogram Limited anatomy at 18 weeks. - US MFM OB FOLLOW UP; Future  3. Vaginal discharge during pregnancy in third trimester - Wet prep, genital; will follow up results and manage accordingly.  4. Supervision of high risk pregnancy in third trimester Preterm labor symptoms and general obstetric precautions including but not limited to vaginal bleeding, contractions, leaking of fluid and fetal movement were reviewed in detail with the patient. Please refer to After Visit Summary for other counseling recommendations.  Return in about 1 week (around 07/29/2016) for OB Visit.  Tereso NewcomerUgonna A Arely Tinner, MD

## 2016-07-22 NOTE — BH Specialist Note (Signed)
Session Start time: 4:30   End Time: 5:15 Total Time:  45 minutes Type of Service: Behavioral Health - Individual/Family Interpreter: No.   Interpreter Name & Language: n/a # [facility] July [facility]-June 2018: 3rd   SUBJECTIVE: Vanessa Foster is a 30 y.o. female  Pt. was f/u for:  anxiety and depression. Pt. reports the following symptoms/concerns: Pt states that she has continued to listen to gospel songs for some relief, is looking forward to getting married in one month, and has been thinking about walking at the mall, as she used to enjoy. Pt says she is not thinking about harming herself, but that she worries about what would happen to her children if she did not survive childbirth. Pt wants to have a vaginal birth, and does not want to have another c-section again; aware that managing her stress is helpful in keeping her blood pressure lowered.  Duration of problem:  Over two months Severity: severe Previous treatment: none   OBJECTIVE: Mood: Depressed & Affect: Tearful Risk of harm to self or others: No known risk of harm to self or others Assessments administered: PHQ9: 20/ GAD7: 19  LIFE CONTEXT:  Family & Social: Lives with husband and child; friends and co-workers supportive (throwing her surprise Careers information officerbaby shower) School/ Work: Still working 3rd shift, hopes to continue until due date  Self-Care: not taking prenatal vitamins, as they make her sick; sometimes no appetite because she feels sick Life changes: current pregnancy What is important to pt/family (values): Healthy baby   GOALS ADDRESSED:  -Alleviate symptoms of depression  INTERVENTIONS: Solution Focused   ASSESSMENT:  Pt currently experiencing Major depressive disorder.  Pt may benefit from brief therapeutic interventions regarding coping with depression.      PLAN: 1. F/U with behavioral health clinician: At next visit in two weeks for brief check-in 2. Behavioral Health meds: does not want to take 3.  Behavioral recommendations:  -Begin taking prenatal vitamins with saltine crackers before bedtime -Go to mall to walk twice weekly until birth -Consider using mall massage chair after each walk at mall for relaxation and self-care prior to birth -Continue listening to [employer] Rxgospel songs daily 4. Referral: Brief Counseling/Psychotherapy   Woc-Behavioral Health Clinician  Behavioral Health Clinician  Warmhandoff: no  Depression screen [facility] 2/9 05/21/2016 04/15/2016  Decreased Interest 3 2  Down, Depressed, Hopeless 3 3  PHQ - 2 Score 6 5  Altered sleeping 3 3  Tired, decreased energy 3 3  Change in appetite 2 3  Feeling bad or failure about yourself  3 2  Trouble concentrating 3 3  Moving slowly or fidgety/restless 2 2  Suicidal thoughts 1 0  PHQ-9 Score 23 21   GAD 7 : Generalized Anxiety Score 05/21/2016 04/15/2016  Nervous, Anxious, on Edge 3 3  Control/stop worrying 3 3  Worry too much - different things 3 3  Trouble relaxing 3 3  Restless 3 2  Easily annoyed or irritable 3 3  Afraid - awful might happen 3 2  Total GAD 7 Score 21 19

## 2016-07-22 NOTE — Progress Notes (Signed)
C/o "fluid" leaking, not sure if it is urine Tdap today Urine: trace hgb, small leukocytes

## 2016-07-23 ENCOUNTER — Telehealth: Payer: Self-pay | Admitting: *Deleted

## 2016-07-23 ENCOUNTER — Encounter: Payer: Self-pay | Admitting: Obstetrics & Gynecology

## 2016-07-23 ENCOUNTER — Encounter: Payer: Self-pay | Admitting: *Deleted

## 2016-07-23 DIAGNOSIS — O24419 Gestational diabetes mellitus in pregnancy, unspecified control: Secondary | ICD-10-CM

## 2016-07-23 LAB — RPR

## 2016-07-23 LAB — WET PREP, GENITAL: Trich, Wet Prep: NONE SEEN

## 2016-07-23 LAB — HIV ANTIBODY (ROUTINE TESTING W REFLEX): HIV 1&2 Ab, 4th Generation: NONREACTIVE

## 2016-07-23 LAB — HEMOGLOBIN A1C
Hgb A1c MFr Bld: 6.1 % — ABNORMAL HIGH (ref <5.7)
Mean Plasma Glucose: 128 mg/dL

## 2016-07-23 MED ORDER — METRONIDAZOLE 500 MG PO TABS: 500.0000 mg | ORAL_TABLET | Freq: Two times a day (BID) | ORAL | 0 refills | Status: DC

## 2016-07-23 MED ORDER — BLOOD GLUCOSE METER KIT: PACK | 0 refills | Status: DC

## 2016-07-23 MED ORDER — BLOOD GLUCOSE MONITOR KIT: PACK | 0 refills | Status: DC

## 2016-07-23 NOTE — Telephone Encounter (Signed)
Was asked by Asher MuirJamie, our behavioural therapist to call patient as she was speaking with her and patient was haivng problems getting her diabetic supplies. I called Vanessa Foster ans she said they gave her a meter and then told her that her insurance did not cover the strips or lancets.  I told her I would call the pharmacy.  I called the pharmacy and he said the insurance would cover a accucheck nano meter, strips and lancets and he would not charge her for another meter. He asked me to send a new order which I did.   I called Flynn back and notified her I had talked with pharmacy and they are giving her a new meter and not charging her again, call us if any  More issues. She voices understanding.

## 2016-07-23 NOTE — Addendum Note (Signed)
Addended by: Jaynie CollinsANYANWU, Jaheim Canino A on: 07/23/2016 10:28 AM   Modules accepted: Orders

## 2016-07-24 ENCOUNTER — Ambulatory Visit (HOSPITAL_COMMUNITY)
Admission: RE | Admit: 2016-07-24 | Discharge: 2016-07-24 | Disposition: A | Discharge status: Home / Self Care | Payer: Managed Care, Other (non HMO) | Source: Ambulatory Visit | Attending: Obstetrics & Gynecology | Admitting: Obstetrics & Gynecology

## 2016-07-24 ENCOUNTER — Encounter (HOSPITAL_COMMUNITY): Payer: Self-pay

## 2016-07-24 ENCOUNTER — Other Ambulatory Visit: Payer: Self-pay | Admitting: Obstetrics & Gynecology

## 2016-07-24 DIAGNOSIS — Z3A30 30 weeks gestation of pregnancy: Secondary | ICD-10-CM | POA: Diagnosis not present

## 2016-07-24 DIAGNOSIS — IMO0002 Reserved for concepts with insufficient information to code with codable children: Secondary | ICD-10-CM

## 2016-07-24 DIAGNOSIS — Z362 Encounter for other antenatal screening follow-up: Secondary | ICD-10-CM | POA: Diagnosis not present

## 2016-07-24 DIAGNOSIS — O99343 Other mental disorders complicating pregnancy, third trimester: Secondary | ICD-10-CM | POA: Diagnosis not present

## 2016-07-24 DIAGNOSIS — Z0489 Encounter for examination and observation for other specified reasons: Secondary | ICD-10-CM

## 2016-07-24 DIAGNOSIS — O34219 Maternal care for unspecified type scar from previous cesarean delivery: Secondary | ICD-10-CM

## 2016-07-24 DIAGNOSIS — O99213 Obesity complicating pregnancy, third trimester: Secondary | ICD-10-CM | POA: Diagnosis not present

## 2016-07-24 DIAGNOSIS — B9689 Other specified bacterial agents as the cause of diseases classified elsewhere: Secondary | ICD-10-CM

## 2016-07-24 DIAGNOSIS — O0993 Supervision of high risk pregnancy, unspecified, third trimester: Secondary | ICD-10-CM

## 2016-07-24 DIAGNOSIS — N760 Acute vaginitis: Secondary | ICD-10-CM

## 2016-07-24 DIAGNOSIS — O24419 Gestational diabetes mellitus in pregnancy, unspecified control: Secondary | ICD-10-CM

## 2016-07-28 ENCOUNTER — Telehealth: Payer: Self-pay

## 2016-07-28 NOTE — Telephone Encounter (Signed)
Patient has been informed of test results and rx has been picked up.

## 2016-07-29 ENCOUNTER — Ambulatory Visit (INDEPENDENT_AMBULATORY_CARE_PROVIDER_SITE_OTHER): Payer: Managed Care, Other (non HMO) | Admitting: Family

## 2016-07-29 VITALS — BP 104/59 | HR 100 | Wt 216.5 lb

## 2016-07-29 DIAGNOSIS — O99019 Anemia complicating pregnancy, unspecified trimester: Secondary | ICD-10-CM | POA: Insufficient documentation

## 2016-07-29 DIAGNOSIS — O34219 Maternal care for unspecified type scar from previous cesarean delivery: Secondary | ICD-10-CM

## 2016-07-29 DIAGNOSIS — O99013 Anemia complicating pregnancy, third trimester: Secondary | ICD-10-CM

## 2016-07-29 DIAGNOSIS — O0993 Supervision of high risk pregnancy, unspecified, third trimester: Secondary | ICD-10-CM

## 2016-07-29 DIAGNOSIS — O24419 Gestational diabetes mellitus in pregnancy, unspecified control: Secondary | ICD-10-CM

## 2016-07-29 MED ORDER — FERROUS SULFATE 325 (65 FE) MG PO TABS: 325.0000 mg | ORAL_TABLET | Freq: Every day | ORAL | 1 refills | Status: DC

## 2016-07-29 NOTE — Patient Instructions (Signed)
    Dental Assistance:  If unable to pay or uninsured, contact: [facility] to become qualified for the adult dental clinic. Patient must be enrolled in [employer] (uninsured, 0-200% FPL, qualifying info).  Enroll in [employer] first, then see Primary Care Physician assigned to you, the PCP makes a dental referral. [facility] will receive referral and contacts patient for appointment.  Patients with Medicaid           [address], [phone]  [facility] (Children up to 20 + Pregnant Women) - [phone]  [facility] - [address] - [address] [phone]  If unable to pay, or uninsured: contact [facility] ([phone] in [place] - (Children only + Pregnant Women), [phone] in [place]- Children only) to become qualified for the adult dental clinic  Must see if eligible to enroll in [employer] Marketplace before enrolling into the [employer] (exemption required) ([phone] for an appointment)  www.healthcare.gov;   1-800-318-2596.  If not eligible for ACA, then go by Department of Health and Human Services to see if eligible for "orange card."  [address], [place] and [address]- [place].  Once you get an orange card, you will have a Primary Care home who will then refer you to dental if needed.        Other Low-Cost Community Dental Services:   [facility] - [phone] ([phone])   [address] [place] [address]  Dr. Civils - [phone]   [address]    [facility] - [phone]   [address]           [facility]           [address], [place], [place], [address]           [phone], Ext. [phone]           2nd and 4th Thursday of the month at 6:30am (Simple extractions only - no wisdom teeth or surgery) First come/First serve -First 10 clients served           [facility] ([place] and [place] residents only)          [address], [place], [place], [address]           [phone]                    [facility]           [phone]          [facility]          [phone]         [facility]          [phone]       

## 2016-07-29 NOTE — Progress Notes (Signed)
   PRENATAL VISIT NOTE  Subjective:  Vanessa Foster is a 30 y.o. G2P1001 at 3147w4d being seen today for ongoing prenatal care.  She is currently monitored for the following issues for this high-risk pregnancy and has Hemorrhoids; Constipation; Daytime somnolence; Sleep disorder; Cephalalgia; Asthma, mild intermittent; Screening for condition; Need for prophylactic vaccination and inoculation against influenza; Depressed mood; Chronic nausea; Changing skin lesion; Adjustment disorder with mixed anxiety and depressed mood; Supervision of high-risk pregnancy; History of cesarean delivery, antepartum; Bacterial vaginosis; Gestational diabetes; and Anemia of mother in pregnancy, antepartum on her problem list.  Patient reports not having glucose monitor yet.  Plans to pick up on Friday.  Contractions: Irregular.  .  Movement: Present. Denies leaking of fluid.   The following portions of the patient's history were reviewed and updated as appropriate: allergies, current medications, past family history, past medical history, past social history, past surgical history and problem list. Problem list updated.  Objective:   Vitals:   07/29/16 1035  BP: (!) 104/59  Pulse: 100  Weight: 216 lb 8 oz (98.2 kg)    Fetal Status: Fetal Heart Rate (bpm): 154 Fundal Height: 33 cm Movement: Present     General:  Alert, oriented and cooperative. Patient is in no acute distress.  Skin: Skin is warm and dry. No rash noted.   Cardiovascular: Normal heart rate noted  Respiratory: Normal respiratory effort, no problems with respiration noted  Abdomen: Soft, gravid, appropriate for gestational age. Pain/Pressure: Present     Pelvic:  Cervical exam deferred        Extremities: Normal range of motion.     Mental Status: Normal mood and affect. Normal behavior. Normal judgment and thought content.   Assessment and Plan:  Pregnancy: G2P1001 at 2347w4d  1. Anemia of mother in pregnancy, antepartum - ferrous  sulfate (FERROUSUL) 325 (65 FE) MG tablet; Take 1 tablet (325 mg total) by mouth daily with breakfast.  Dispense: 30 tablet; Refill: 1  2. Gestational diabetes mellitus (GDM) in third trimester, gestational diabetes method of control unspecified - Plans to pick up monitor strips Friday  3. History of cesarean delivery, antepartum - Consent under media tab  4. Supervision of high risk pregnancy in third trimester - Reviewed third trimester labs  Preterm labor symptoms and general obstetric precautions including but not limited to vaginal bleeding, contractions, leaking of fluid and fetal movement were reviewed in detail with the patient. Please refer to After Visit Summary for other counseling recommendations.  Return in about 1 week (around 08/05/2016).   Eino FarberWalidah Kennith GainN Karim, CNM

## 2016-08-01 ENCOUNTER — Encounter: Payer: Self-pay | Admitting: Certified Nurse Midwife

## 2016-08-02 ENCOUNTER — Inpatient Hospital Stay (HOSPITAL_COMMUNITY)
Admission: AD | Admit: 2016-08-02 | Discharge: 2016-08-03 | Disposition: A | Discharge status: Home / Self Care | Payer: Managed Care, Other (non HMO) | Source: Ambulatory Visit | Attending: Obstetrics & Gynecology | Admitting: Obstetrics & Gynecology

## 2016-08-02 ENCOUNTER — Encounter (HOSPITAL_COMMUNITY): Payer: Self-pay | Admitting: Certified Nurse Midwife

## 2016-08-02 DIAGNOSIS — O0993 Supervision of high risk pregnancy, unspecified, third trimester: Secondary | ICD-10-CM

## 2016-08-02 DIAGNOSIS — Z3A32 32 weeks gestation of pregnancy: Secondary | ICD-10-CM | POA: Diagnosis not present

## 2016-08-02 DIAGNOSIS — O34219 Maternal care for unspecified type scar from previous cesarean delivery: Secondary | ICD-10-CM

## 2016-08-02 DIAGNOSIS — B9689 Other specified bacterial agents as the cause of diseases classified elsewhere: Secondary | ICD-10-CM

## 2016-08-02 DIAGNOSIS — Z87891 Personal history of nicotine dependence: Secondary | ICD-10-CM | POA: Insufficient documentation

## 2016-08-02 DIAGNOSIS — N760 Acute vaginitis: Secondary | ICD-10-CM

## 2016-08-02 DIAGNOSIS — R109 Unspecified abdominal pain: Secondary | ICD-10-CM | POA: Insufficient documentation

## 2016-08-02 DIAGNOSIS — O26893 Other specified pregnancy related conditions, third trimester: Secondary | ICD-10-CM | POA: Insufficient documentation

## 2016-08-02 DIAGNOSIS — O24419 Gestational diabetes mellitus in pregnancy, unspecified control: Secondary | ICD-10-CM

## 2016-08-02 DIAGNOSIS — E860 Dehydration: Secondary | ICD-10-CM

## 2016-08-02 LAB — URINE MICROSCOPIC-ADD ON

## 2016-08-02 LAB — URINALYSIS, ROUTINE W REFLEX MICROSCOPIC
Bilirubin Urine: NEGATIVE
Glucose, UA: NEGATIVE mg/dL
Hgb urine dipstick: NEGATIVE
Ketones, ur: 15 mg/dL — AB
Nitrite: NEGATIVE
Protein, ur: NEGATIVE mg/dL
Specific Gravity, Urine: >1.03 — ABNORMAL HIGH (ref 1.005–1.030)
pH: 6 (ref 5.0–8.0)

## 2016-08-02 LAB — WET PREP, GENITAL
Sperm: NONE SEEN
Trich, Wet Prep: NONE SEEN
Yeast Wet Prep HPF POC: NONE SEEN

## 2016-08-02 NOTE — Discharge Instructions (Signed)

## 2016-08-02 NOTE — MAU Note (Signed)
Pt presents complaining of constant abdominal pain that is worse when walking. Denies leaking or bleeding. Some yellow discharge. Felt like she was going to pass out at work. Reports good fetal movement.

## 2016-08-02 NOTE — MAU Provider Note (Signed)
MAU HISTORY AND PHYSICAL  Chief Complaint:  Abdominal Pain   Vanessa Foster is a 30 y.o.  G2P1001  at 38w1dpresenting for Abdominal Pain . Patient states she has been having  none contractions, none vaginal bleeding, intact membranes, with active fetal movement.    Was at work this evening and felt lightheaded, had to sit down for 20+ minutes and then was sent home. Works at [employer] staff checked her blood pressure,pulse, and CBG there. BP was normal. She was tachycardic. Blood sugar was 152 (1.5 hours after eating dinner).   She has felt some abdominal discomfort today, also felt some Friday evening and drank water and laid down until it passed.   She does not drink much water, drinks mostly juice or gatorade.   Past Medical History:  Diagnosis Date  . Abnormal Pap smear of cervix    2011?  .Marland KitchenAllergy   . Anemia   . Anxiety   . Asthma    hospitalization 2008  . Chronic nausea   . Depression   . Dysmenorrhea   . Dyspareunia   . Gestational diabetes 07/13/2016  . Migraines   . PID (pelvic inflammatory disease)   . Wears glasses     Past Surgical History:  Procedure Laterality Date  . CESAREAN SECTION    . implanon     inserted 2010  . WISDOM TOOTH EXTRACTION      Family History  Problem Relation Age of Onset  . Hypertension Mother   . Heart disease Mother     early 452s . Diabetes Mother   . Diabetes Paternal Grandmother   . Stroke Paternal Grandmother   . Other Father     unknown    Social History  Substance Use Topics  . Smoking status: Former Smoker    Quit date: 03/20/2008  . Smokeless tobacco: Never Used  . Alcohol use No    No Known Allergies  Prescriptions Prior to Admission  Medication Sig Dispense Refill Last Dose  . albuterol (PROVENTIL HFA;VENTOLIN HFA) 108 (90 Base) MCG/ACT inhaler Inhale 2 puffs into the lungs every 6 (six) hours as needed for wheezing or shortness of breath. 1 Inhaler 3 Past Week at Unknown time  . Prenatal  Multivit-Min-Fe-FA (PRENATAL VITAMINS PO) Take 1 tablet by mouth daily.   08/02/2016 at Unknown time  . ACCU-CHEK FASTCLIX LANCETS MISC 1 Units by Percutaneous route 4 (four) times daily. 100 each 12 Taking  . azithromycin (ZITHROMAX) 250 MG tablet Take 1 tablet (250 mg total) by mouth daily. Take first 2 tablets together, then 1 every day until finished. (Patient not taking: Reported on 07/29/2016) 6 tablet 0 Not Taking  . beclomethasone (QVAR) 40 MCG/ACT inhaler Inhale 2 puffs into the lungs 2 (two) times daily. 1 Inhaler 11 Taking  . blood glucose meter kit and supplies KIT Dispense based on patient and insurance preference. Use up to four times daily as directed. (FOR ICD-9 250.00, 250.01).dispence nano meter. 1 each 0 Taking  . blood glucose meter kit and supplies Dispense based on patient and insurance preference. Use up to four times daily as directed. (FOR ICD-9 250.00, 250.01).use accucheck nano. 1 each 0 Taking  . Elastic Bandages & Supports (COMFORT FIT MATERNITY SUPP MED) MISC 1 Device by Does not apply route daily. 1 each 0 Taking  . Elastic Bandages & Supports (MEDICAL COMPRESSION STOCKINGS) MISC 1 Device by Does not apply route daily. 1 each 1 Taking  . ferrous sulfate (FERROUSUL) 325 (65  FE) MG tablet Take 1 tablet (325 mg total) by mouth daily with breakfast. 30 tablet 1   . glucose blood (ACCU-CHEK SMARTVIEW) test strip Use as instructed to check blood sugars 100 each 12 Taking  . metroNIDAZOLE (FLAGYL) 500 MG tablet Take 1 tablet (500 mg total) by mouth 2 (two) times daily. 14 tablet 0 Taking    Review of Systems - Negative except for what is mentioned in HPI.  Physical Exam  Blood pressure 120/69, pulse 110, temperature 98.3 F (36.8 C), temperature source Oral, resp. rate 18, last menstrual period 11/20/2015. GENERAL: Well-developed, well-nourished female in no acute distress.  LUNGS: No respiratory distress HEART: Regular rate ABDOMEN: Soft, nontender, nondistended, gravid  abdomen EXTREMITIES: Nontender, no edema, 2+ distal pulses. GU: moderate amount of mucous present, cervix visually closed Presentation: cephalic FHT:  Baseline 446, moderate variability, accelerations present, no decelerations Contractions: none Dilation: Closed Effacement (%): Thick Cervical Position: Posterior Exam by:: Dr Vanetta Shawl    Labs: Results for orders placed or performed during the hospital encounter of 08/02/16 (from the past 24 hour(s))  Urinalysis, Routine w reflex microscopic (not at [facility])   Collection Time: 08/02/16 10:20 PM  Result Value Ref Range   Color, Urine YELLOW YELLOW   APPearance CLEAR CLEAR   Specific Gravity, Urine >1.030 (H) 1.005 - 1.030   pH 6.0 5.0 - 8.0   Glucose, UA NEGATIVE NEGATIVE mg/dL   Hgb urine dipstick NEGATIVE NEGATIVE   Bilirubin Urine NEGATIVE NEGATIVE   Ketones, ur 15 (A) NEGATIVE mg/dL   Protein, ur NEGATIVE NEGATIVE mg/dL   Nitrite NEGATIVE NEGATIVE   Leukocytes, UA SMALL (A) NEGATIVE  Urine microscopic-add on   Collection Time: 08/02/16 10:20 PM  Result Value Ref Range   Squamous Epithelial / LPF 6-30 (A) NONE SEEN   WBC, UA 0-5 0 - 5 WBC/hpf   RBC / HPF 0-5 0 - 5 RBC/hpf   Bacteria, UA FEW (A) NONE SEEN  Wet prep, genital   Collection Time: 08/02/16 11:07 PM  Result Value Ref Range   Yeast Wet Prep HPF POC NONE SEEN NONE SEEN   Trich, Wet Prep NONE SEEN NONE SEEN   Clue Cells Wet Prep HPF POC PRESENT (A) NONE SEEN   WBC, Wet Prep HPF POC FEW (A) NONE SEEN   Sperm NONE SEEN     Imaging Studies:  [place] [facility] Ob Follow Up  Result Date: 07/24/2016 OBSTETRICAL ULTRASOUND: This exam was performed within a [facility]. The OB US report was generated in the AS system, and faxed to the ordering physician.  This report is available in the [facility]. See the [facility] Obstetric US report via the Image Link.   Assessment: Vanessa Foster is  30 y.o. G2P1001 at 94w1dpresents with Abdominal Pain . MDM UA Wet  prep GC/Ct  Plan:  Dehydration Discharge home Instructed to increase water intake Return precautions given Follow up as scheduled 11/15 Patient verbalized agreement and understanding with plan  ASteve Rattler DO PGY-1 11/12/201711:41 PM

## 2016-08-03 ENCOUNTER — Telehealth: Payer: Self-pay

## 2016-08-03 DIAGNOSIS — O26893 Other specified pregnancy related conditions, third trimester: Secondary | ICD-10-CM | POA: Diagnosis not present

## 2016-08-03 LAB — GC/CHLAMYDIA PROBE AMP (~~LOC~~) NOT AT ARMC
Chlamydia: NEGATIVE
Neisseria Gonorrhea: NEGATIVE

## 2016-08-03 NOTE — Telephone Encounter (Addendum)
Spoke with patient. Patient states that she was [facility] on Flagyl for BV [facility] Every time she took a tablet she would have nausea and vomiting. [facility]. Reports she stopped taking Flagyl after 5 doses. Is feeling much better. Denies any nausea, vomiting, or concerns at this time. Patient [facility]. Aware to notify them if she develops any new symptoms or symptoms return. She is agreeable.  Visit Follow-Up Question  Message [phone]  From Jennessy Tre Torain To Verner CholDeborah S Leonard, CNM Sent 08/01/2016 2:32 AM  Hello I was prescribed meds for BV and I have tried to take several dosages and it will never seem to stay down I tried with food and juice and I always throw it right back up. I need to know what else can I do at this point. I return to doctor on Wednesday.   Responsible Party   Pool - [facility] Clinical Pool No one has taken responsibility for this message.  No actions have been taken on this message.   Dr.Jertson, any further recommendations for patient?

## 2016-08-03 NOTE — Telephone Encounter (Signed)
Telephone encounter created to discuss MyChart message with patient. Please see encounter dated with today's date.

## 2016-08-04 ENCOUNTER — Ambulatory Visit (INDEPENDENT_AMBULATORY_CARE_PROVIDER_SITE_OTHER): Payer: Managed Care, Other (non HMO) | Admitting: Clinical

## 2016-08-04 ENCOUNTER — Encounter: Payer: Self-pay | Admitting: Medical

## 2016-08-04 ENCOUNTER — Ambulatory Visit (INDEPENDENT_AMBULATORY_CARE_PROVIDER_SITE_OTHER): Payer: Managed Care, Other (non HMO) | Admitting: Medical

## 2016-08-04 VITALS — BP 109/65 | HR 110 | Wt 215.8 lb

## 2016-08-04 DIAGNOSIS — F332 Major depressive disorder, recurrent severe without psychotic features: Secondary | ICD-10-CM

## 2016-08-04 DIAGNOSIS — D649 Anemia, unspecified: Secondary | ICD-10-CM

## 2016-08-04 DIAGNOSIS — O24419 Gestational diabetes mellitus in pregnancy, unspecified control: Secondary | ICD-10-CM

## 2016-08-04 DIAGNOSIS — O99019 Anemia complicating pregnancy, unspecified trimester: Secondary | ICD-10-CM

## 2016-08-04 DIAGNOSIS — O99013 Anemia complicating pregnancy, third trimester: Secondary | ICD-10-CM

## 2016-08-04 MED ORDER — BECLOMETHASONE DIPROPIONATE 40 MCG/ACT IN AERS: 2.0000 | INHALATION_SPRAY | Freq: Two times a day (BID) | RESPIRATORY_TRACT | 11 refills | Status: DC

## 2016-08-04 MED ORDER — FERROUS SULFATE 325 (65 FE) MG PO TABS: 325.0000 mg | ORAL_TABLET | Freq: Every day | ORAL | 1 refills | Status: DC

## 2016-08-04 NOTE — BH Specialist Note (Signed)
Session Start time: 4:00   End Time: 4:25 Total Time:  25 minutes Type of Service: Behavioral Health - Individual/Family Interpreter: No.   Interpreter Name & Language: n/a # [facility]   SUBJECTIVE: Vanessa Foster is a 30 y.o. female  Pt. was f/u for:  anxiety and depression. Pt. reports the following symptoms/concerns: Pt states that her primary concern today is that she "cries too much", and has had dreams about her fiancee being taken away from her; copes by looking forward to work "surprise" shower, getting married on Friday, 08/07/16, and possibly going to soon-to-be husbands' family the following weekend. Pt still says no to antidepressants, but will pick up and begin taking iron pills tomorrow after work.  Duration of problem:  Over 2.5 months Severity: severe Previous treatment: none   OBJECTIVE: Mood: Depressed & Affect: Tearful Risk of harm to self or others: No known risk of harm to self or others, no SI, no HI, no plan. She says that her "thoughts of death" are that she has a fear of death, and continues to worry about the unknowns in pregnancy, and states that she still has no thoughts of harming herself. 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  Pt may benefit from Continued brief therapeutic interventions regarding coping with  symptoms of anxiety and depression.      PLAN: 1. F/U with behavioral health clinician: Two weeks, or earlier, as needed 2. Behavioral Health meds: none(will begin iron for anemia tomorrow) 3. Behavioral recommendations:  -Eat today before going in to work -Go to pharmacy to pick up iron pills tomorrow(Wednesday, 08/05/2016) -Begin taking iron pills, as prescribed -Consider talking to medical provider about starting antidepressants postpartum, if increase in iron levels do not help to decrease symptoms of depression -Enjoy surprise baby shower and wedding this coming week 4. Referral: Brief Counseling/Psychotherapy and Psychoeducation   Woc-Behavioral Health Clinician  Behavioral Health Clinician  Warmhandoff: no  Dep[facility] screen [facility] 2/9 08/04/2016 07/29/2016 07/22/2016 05/21/2016 04/15/2016  Decreased Interest 3 3 2 3 2   Down, Depressed, Hopeless 2 2 2 3 3   PHQ - 2 Score 5 5 4 6 5   Altered sleeping 3 3 3 3 3   Tired, decreased energy 3 2 3 3 3   Change in appetite 3 2 3 2 3   Feeling bad or failure about yourself  2 2 2 3 2   Trouble concentrating 2 2 2 3 3   Moving slowly or fidgety/restless 2 2 2 2 2   Suicidal thoughts 1 0 1 1 0  PHQ-9 Score 21 18 20 23 21    GAD 7 : Generalized Anxiety Score 08/04/2016 07/29/2016 07/22/2016 05/21/2016  Nervous, Anxious, on Edge 3 3 3 3   Control/stop worrying 3 2 3 3   Worry too much - different things 3 2 3 3   Trouble relaxing 3 2 2 3   Restless 2 3 3 3   Easily annoyed or irritable 3 3 3 3   Afraid - awful  might happen 2 2 2 3   Total GAD 7 Score 19 17 19  21

## 2016-08-04 NOTE — Progress Notes (Signed)
   PRENATAL VISIT NOTE  Subjective:  Vanessa Foster is a 30 y.o. G2P1001 at 3967w3d being seen today for ongoing prenatal care.  She is currently monitored for the following issues for this high-risk pregnancy and has Hemorrhoids; Constipation; Daytime somnolence; Sleep disorder; Cephalalgia; Asthma, mild intermittent; Screening for condition; Need for prophylactic vaccination and inoculation against influenza; Depressed mood; Chronic nausea; Changing skin lesion; Adjustment disorder with mixed anxiety and depressed mood; Supervision of high-risk pregnancy; History of cesarean delivery, antepartum; Bacterial vaginosis; Gestational diabetes; and Anemia of mother in pregnancy, antepartum on her problem list.  Patient reports abdominal pain, fatigue.  Contractions: Not present. Vag. Bleeding: None.  Movement: Present. Denies leaking of fluid.   The following portions of the patient's history were reviewed and updated as appropriate: allergies, current medications, past family history, past medical history, past social history, past surgical history and problem list. Problem list updated.  Objective:   Vitals:   08/04/16 1447  BP: 109/65  Pulse: (!) 110  Weight: 215 lb 12.8 oz (97.9 kg)    Fetal Status: Fetal Heart Rate (bpm): 144 Fundal Height: 34 cm Movement: Present     General:  Alert, oriented and cooperative. Patient is in no acute distress.  Skin: Skin is warm and dry. No rash noted.   Cardiovascular: Normal heart rate noted  Respiratory: Normal respiratory effort, no problems with respiration noted  Abdomen: Soft, gravid, appropriate for gestational age. Pain/Pressure: Absent     Pelvic:  Cervical exam deferred        Extremities: Normal range of motion.  Edema: None  Mental Status: Normal mood and affect. Normal behavior. Normal judgment and thought content.   Assessment and Plan:  Pregnancy: G2P1001 at 8967w3d  1. Anemia of mother in pregnancy, antepartum - ferrous sulfate  (FERROUSUL) 325 (65 FE) MG tablet; Take 1 tablet (325 mg total) by mouth daily with breakfast.  Dispense: 30 tablet; Refill: 1  2. Gestational diabetes mellitus (GDM) in third trimester, gestational diabetes method of control unspecified - CBG log reviewed. Patient is not checking consistently. Advised to check consistently. CBG values for this week are appropriate.  - Patient states that she has been skipping meals and doesn't like most of the foods she was advised to eat by diabetes educations, dicussed need for consistent nutrition - Patient did not give urine sample as requested   3. Round ligament pain - Discussed use of abdominal binder - Work restriction letter given   4. Depression - Patient seen by Hulda MarinJamie McMannes  Preterm labor symptoms and general obstetric precautions including but not limited to vaginal bleeding, contractions, leaking of fluid and fetal movement were reviewed in detail with the patient. Please refer to After Visit Summary for other counseling recommendations.  Return in about 2 weeks (around 08/18/2016) for HR OB.   Marny LowensteinJulie N [place], PA-C

## 2016-08-04 NOTE — Progress Notes (Signed)
Pt seen in [facility] on Sunday for pain and pressure.

## 2016-08-04 NOTE — Patient Instructions (Signed)
Introduction Patient Name: ________________________________________________ Patient Due Date: ____________________ What is a fetal movement count? A fetal movement count is the number of times that you feel your baby move during a certain amount of time. This may also be called a fetal kick count. A fetal movement count is recommended for every pregnant woman. You may be asked to start counting fetal movements as early as week 28 of your pregnancy. Pay attention to when your baby is most active. You may notice your baby's sleep and wake cycles. You may also notice things that make your baby move more. You should do a fetal movement count:  When your baby is normally most active.  At the same time each day. A good time to count movements is while you are resting, after having something to eat and drink. How do I count fetal movements? 1. Find a quiet, comfortable area. Sit, or lie down on your side. 2. Write down the date, the start time and stop time, and the number of movements that you felt between those two times. Take this information with you to your health care visits. 3. For 2 hours, count kicks, flutters, swishes, rolls, and jabs. You should feel at least 10 movements during 2 hours. 4. You may stop counting after you have felt 10 movements. 5. If you do not feel 10 movements in 2 hours, have something to eat and drink. Then, keep resting and counting for 1 hour. If you feel at least 4 movements during that hour, you may stop counting. Contact a health care provider if:  You feel fewer than 4 movements in 2 hours.  Your baby is not moving like he or she usually does. Date: ____________ Start time: ____________ Stop time: ____________ Movements: ____________ Date: ____________ Start time: ____________ Stop time: ____________ Movements: ____________ Date: ____________ Start time: ____________ Stop time: ____________ Movements: ____________ Date: ____________ Start time: ____________  Stop time: ____________ Movements: ____________ Date: ____________ Start time: ____________ Stop time: ____________ Movements: ____________ Date: ____________ Start time: ____________ Stop time: ____________ Movements: ____________ Date: ____________ Start time: ____________ Stop time: ____________ Movements: ____________ Date: ____________ Start time: ____________ Stop time: ____________ Movements: ____________ Date: ____________ Start time: ____________ Stop time: ____________ Movements: ____________ This information is not intended to replace advice given to you by your health care provider. Make sure you discuss any questions you have with your health care provider. Document Released: 10/07/2006 Document Revised: 05/06/2016 Document Reviewed: 10/17/2015 Elsevier Interactive Patient Education  2017 [employer] Braxton Hicks Contractions Contractions of the uterus can occur throughout pregnancy. Contractions are not always a sign that you are in labor.  WHAT ARE BRAXTON HICKS CONTRACTIONS?  Contractions that occur before labor are called Braxton Hicks contractions, or false labor. Toward the end of pregnancy (32-34 weeks), these contractions can develop more often and may become more forceful. This is not true labor because these contractions do not result in opening (dilatation) and thinning of the cervix. They are sometimes difficult to tell apart from true labor because these contractions can be forceful and people have different pain tolerances. You should not feel embarrassed if you go to the hospital with false labor. Sometimes, the only way to tell if you are in true labor is for your health care provider to look for changes in the cervix. If there are no prenatal problems or other health problems associated with the pregnancy, it is completely safe to be sent home with false labor and await the onset of true labor.   HOW CAN YOU TELL THE DIFFERENCE BETWEEN TRUE AND FALSE LABOR? False Labor     The contractions of false labor are usually shorter and not as hard as those of true labor.   The contractions are usually irregular.   The contractions are often felt in the front of the lower abdomen and in the groin.   The contractions may go away when you walk around or change positions while lying down.   The contractions get weaker and are shorter lasting as time goes on.   The contractions do not usually become progressively stronger, regular, and closer together as with true labor.  True Labor   Contractions in true labor last 30-70 seconds, become very regular, usually become more intense, and increase in frequency.   The contractions do not go away with walking.   The discomfort is usually felt in the top of the uterus and spreads to the lower abdomen and low back.   True labor can be determined by your health care provider with an exam. This will show that the cervix is dilating and getting thinner.  WHAT TO REMEMBER  Keep up with your usual exercises and follow other instructions given by your health care provider.   Take medicines as directed by your health care provider.   Keep your regular prenatal appointments.   Eat and drink lightly if you think you are going into labor.   If Braxton Hicks contractions are making you uncomfortable:   Change your position from lying down or resting to walking, or from walking to resting.   Sit and rest in a tub of warm water.   Drink 2-3 glasses of water. Dehydration may cause these contractions.   Do slow and deep breathing several times an hour.  WHEN SHOULD I SEEK IMMEDIATE MEDICAL CARE? Seek immediate medical care if:  Your contractions become stronger, more regular, and closer together.   You have fluid leaking or gushing from your vagina.   You have a fever.   You pass blood-tinged mucus.   You have vaginal bleeding.   You have continuous abdominal pain.   You have low back pain  that you never had before.   You feel your baby's head pushing down and causing pelvic pressure.   Your baby is not moving as much as it used to.  This information is not intended to replace advice given to you by your health care provider. Make sure you discuss any questions you have with your health care provider. Document Released: 09/07/2005 Document Revised: 12/30/2015 Document Reviewed: 06/19/2013 Elsevier Interactive Patient Education  2017 Elsevier Inc.  

## 2016-08-19 ENCOUNTER — Ambulatory Visit (INDEPENDENT_AMBULATORY_CARE_PROVIDER_SITE_OTHER): Payer: Managed Care, Other (non HMO) | Admitting: Obstetrics and Gynecology

## 2016-08-19 VITALS — BP 108/77 | HR 101 | Wt 216.1 lb

## 2016-08-19 DIAGNOSIS — O24419 Gestational diabetes mellitus in pregnancy, unspecified control: Secondary | ICD-10-CM

## 2016-08-19 DIAGNOSIS — O99343 Other mental disorders complicating pregnancy, third trimester: Secondary | ICD-10-CM

## 2016-08-19 DIAGNOSIS — J4540 Moderate persistent asthma, uncomplicated: Secondary | ICD-10-CM

## 2016-08-19 DIAGNOSIS — D649 Anemia, unspecified: Secondary | ICD-10-CM

## 2016-08-19 DIAGNOSIS — O99019 Anemia complicating pregnancy, unspecified trimester: Secondary | ICD-10-CM

## 2016-08-19 DIAGNOSIS — O34219 Maternal care for unspecified type scar from previous cesarean delivery: Secondary | ICD-10-CM

## 2016-08-19 DIAGNOSIS — F4323 Adjustment disorder with mixed anxiety and depressed mood: Secondary | ICD-10-CM

## 2016-08-19 DIAGNOSIS — O99513 Diseases of the respiratory system complicating pregnancy, third trimester: Secondary | ICD-10-CM

## 2016-08-19 DIAGNOSIS — O0993 Supervision of high risk pregnancy, unspecified, third trimester: Secondary | ICD-10-CM

## 2016-08-19 MED ORDER — BECLOMETHASONE DIPROPIONATE 80 MCG/ACT IN AERS: 2.0000 | INHALATION_SPRAY | Freq: Two times a day (BID) | RESPIRATORY_TRACT | 12 refills | Status: DC

## 2016-08-20 NOTE — Progress Notes (Signed)
Prenatal Visit Note Date: 08/19/2016 Clinic: [facility]  Subjective:  Vanessa Foster is a 30 y.o. G2P1001 at 6919w5d being seen today for ongoing prenatal care.  She is currently monitored for the following issues for this high-risk pregnancy and has Hemorrhoids; Daytime somnolence; Sleep disorder; Cephalalgia; Asthma, mild intermittent; Depressed mood; Chronic nausea; Changing skin lesion; Adjustment disorder with mixed anxiety and depressed mood; Supervision of high-risk pregnancy; History of cesarean delivery, antepartum; Gestational diabetes; and Anemia of mother in pregnancy, antepartum on her problem list.  Patient reports no complaints.   Contractions: Irritability. Vag. Bleeding: None.  Movement: Present. Denies leaking of fluid.   The following portions of the patient's history were reviewed and updated as appropriate: allergies, current medications, past family history, past medical history, past social history, past surgical history and problem list. Problem list updated.  Objective:   Vitals:   08/19/16 1555  BP: 108/77  Pulse: (!) 101  Weight: 216 lb 1.6 oz (98 kg)    Fetal Status: Fetal Heart Rate (bpm): 147 Fundal Height: 35 cm Movement: Present  Presentation: Vertex  General:  Alert, oriented and cooperative. Patient is in no acute distress.  Skin: Skin is warm and dry. No rash noted.   Cardiovascular: Normal heart rate noted  Respiratory: Normal respiratory effort, no problems with respiration noted  Abdomen: Soft, gravid, appropriate for gestational age. Pain/Pressure: Present     Pelvic:  Cervical exam deferred        Extremities: Normal range of motion.  Edema: Trace  Mental Status: Normal mood and affect. Normal behavior. Normal judgment and thought content.   Urinalysis:      Assessment and Plan:  Pregnancy: G2P1001 at 2719w5d  1. Supervision of high risk pregnancy in third trimester Routine care. BC options d/w pt - Peak flow  meter  2. Gestational diabetes mellitus (GDM) in third trimester, gestational diabetes method of control unspecified Normal BS log with a1 control  3. Moderate persistent asthma, unspecified whether complicated I asked her how often she's using the albuterol and she says about 2-3x/day. She doesn't have a PF meter. Rx for this and qvar written for her - Peak flow meter  4. Adjustment disorder with mixed anxiety and depressed mood No issues.   5. History of cesarean delivery, antepartum Options d/w pt and thinking about delivery mode.  Preterm labor symptoms and general obstetric precautions including but not limited to vaginal bleeding, contractions, leaking of fluid and fetal movement were reviewed in detail with the patient. Please refer to After Visit Summary for other counseling recommendations.  Return in about 1 week (around 08/26/2016). for earlier follow up since starting on qvar.    Vanessa Bingharlie Skippy Marhefka, MD

## 2016-08-26 ENCOUNTER — Ambulatory Visit (INDEPENDENT_AMBULATORY_CARE_PROVIDER_SITE_OTHER): Payer: Managed Care, Other (non HMO) | Admitting: Obstetrics and Gynecology

## 2016-08-26 VITALS — BP 111/80 | HR 98 | Wt 218.0 lb

## 2016-08-26 DIAGNOSIS — O99513 Diseases of the respiratory system complicating pregnancy, third trimester: Secondary | ICD-10-CM

## 2016-08-26 DIAGNOSIS — O24415 Gestational diabetes mellitus in pregnancy, controlled by oral hypoglycemic drugs: Secondary | ICD-10-CM

## 2016-08-26 DIAGNOSIS — O34219 Maternal care for unspecified type scar from previous cesarean delivery: Secondary | ICD-10-CM

## 2016-08-26 DIAGNOSIS — O0993 Supervision of high risk pregnancy, unspecified, third trimester: Secondary | ICD-10-CM

## 2016-08-26 DIAGNOSIS — J4520 Mild intermittent asthma, uncomplicated: Secondary | ICD-10-CM

## 2016-08-26 DIAGNOSIS — O99019 Anemia complicating pregnancy, unspecified trimester: Secondary | ICD-10-CM

## 2016-08-26 DIAGNOSIS — D649 Anemia, unspecified: Secondary | ICD-10-CM

## 2016-08-26 DIAGNOSIS — O99013 Anemia complicating pregnancy, third trimester: Secondary | ICD-10-CM

## 2016-08-26 NOTE — Progress Notes (Signed)
Prenatal Visit Note Date: 08/26/2016 Clinic: [facility]  Subjective:  Denyla Tre Gala Lewandowskyorain is a 30 y.o. G2P1001 at 5322w4d being seen today for ongoing prenatal care.  She is currently monitored for the following issues for this high-risk pregnancy and has Hemorrhoids; Daytime somnolence; Sleep disorder; Cephalalgia; Asthma, mild intermittent; Depressed mood; Chronic nausea; Changing skin lesion; Adjustment disorder with mixed anxiety and depressed mood; Supervision of high-risk pregnancy; History of cesarean delivery, antepartum; Gestational diabetes; and Anemia of mother in pregnancy, antepartum on her problem list.  Patient reports no complaints.   Contractions: Not present. Vag. Bleeding: None.  Movement: Present. Denies leaking of fluid.   The following portions of the patient's history were reviewed and updated as appropriate: allergies, current medications, past family history, past medical history, past social history, past surgical history and problem list. Problem list updated.  Objective:   Vitals:   08/26/16 1346  BP: 111/80  Pulse: 98  Weight: 218 lb (98.9 kg)    Fetal Status: Fetal Heart Rate (bpm): 150 Fundal Height: 35 cm Movement: Present  Presentation: Vertex  General:  Alert, oriented and cooperative. Patient is in no acute distress.  Skin: Skin is warm and dry. No rash noted.   Cardiovascular: Normal heart rate noted  Respiratory: Normal respiratory effort, no problems with respiration noted  Abdomen: Soft, gravid, appropriate for gestational age. Pain/Pressure: Absent     Pelvic:  Cervical exam deferred        Extremities: Normal range of motion.  Edema: Trace  Mental Status: Normal mood and affect. Normal behavior. Normal judgment and thought content.   Urinalysis:      Assessment and Plan:  Pregnancy: G2P1001 at 2922w4d  1. Supervision of high risk pregnancy in third trimester GBS nv. nexplanon  2. History of cesarean delivery,  antepartum Desires TOLAC; consent form already signed  3. Anemia of mother in pregnancy, antepartum No issues  4. Mild intermittent asthma without complication Pt states she never picked up the qvar and that she hasn't needed her PRN albuterol. I told her to let us know if s/s come back  5. Gestational diabetes mellitus (GDM) in third trimester controlled on oral hypoglycemic drug Pt didn't bring her log book and isn't consistently checking her sugars because she works third shift. Strategies d/w her and importance of checking sugars. Based on #s she gave me, continue with just a1 control.   Preterm labor symptoms and general obstetric precautions including but not limited to vaginal bleeding, contractions, leaking of fluid and fetal movement were reviewed in detail with the patient. Please refer to After Visit Summary for other counseling recommendations.  Return in about 1 week (around 09/02/2016) for rob.   Franklin Bingharlie Dajanee Voorheis, MD

## 2016-09-02 ENCOUNTER — Ambulatory Visit (INDEPENDENT_AMBULATORY_CARE_PROVIDER_SITE_OTHER): Payer: Medicaid Other | Admitting: Clinical

## 2016-09-02 ENCOUNTER — Ambulatory Visit (INDEPENDENT_AMBULATORY_CARE_PROVIDER_SITE_OTHER): Payer: Managed Care, Other (non HMO) | Admitting: Obstetrics and Gynecology

## 2016-09-02 VITALS — BP 110/73 | HR 109 | Wt 218.3 lb

## 2016-09-02 DIAGNOSIS — O0993 Supervision of high risk pregnancy, unspecified, third trimester: Secondary | ICD-10-CM

## 2016-09-02 DIAGNOSIS — Z113 Encounter for screening for infections with a predominantly sexual mode of transmission: Secondary | ICD-10-CM | POA: Diagnosis not present

## 2016-09-02 DIAGNOSIS — O24415 Gestational diabetes mellitus in pregnancy, controlled by oral hypoglycemic drugs: Secondary | ICD-10-CM

## 2016-09-02 DIAGNOSIS — F331 Major depressive disorder, recurrent, moderate: Secondary | ICD-10-CM

## 2016-09-02 DIAGNOSIS — O34219 Maternal care for unspecified type scar from previous cesarean delivery: Secondary | ICD-10-CM

## 2016-09-02 LAB — POCT URINALYSIS DIP (DEVICE)
Bilirubin Urine: NEGATIVE
Glucose, UA: NEGATIVE mg/dL
Hgb urine dipstick: NEGATIVE
Ketones, ur: NEGATIVE mg/dL
Nitrite: NEGATIVE
Protein, ur: 30 mg/dL — AB
Specific Gravity, Urine: 1.025 (ref 1.005–1.030)
Urobilinogen, UA: 1 mg/dL (ref 0.0–1.0)
pH: 6 (ref 5.0–8.0)

## 2016-09-02 LAB — GLUCOSE, CAPILLARY: Glucose-Capillary: 156 mg/dL — ABNORMAL HIGH (ref 65–99)

## 2016-09-02 NOTE — Progress Notes (Signed)
States has not been checking cbg's for over a week because she has not been eating well. We discussed she should still be checking her cbg's and that we will do a cbg today.

## 2016-09-02 NOTE — BH Specialist Note (Addendum)
Session Start time: 9:45   End Time: 10:15 Total Time:  30 minutes Type of Service: Behavioral Health - Individual/Family Interpreter: No.   Interpreter Name & Language: n/a # [facility]  SUBJECTIVE: Vanessa Foster is a 30 y.o. female  Pt. was f/u for:  depression. Pt. 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 Pt still has some concerns about upcoming birth(pain, does not want c-section, offer of too much "help" after birth from extended family).  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  Pt may benefit from brief therapeutic interventions regarding coping with symptoms of depression.   PLAN: 1. F/U with behavioral health clinician: One month(will need comprehensive clinical assessment at next visit) 2.  Behavioral Health meds: none 3. Behavioral recommendations:  -Continue taking iron as prescribed by medical provider -Continue taking daily walks -Continue working for as long as able, prior to birth -Consider discussion with FOB and extended family regarding acceptable length of stay after birth, and the types of help that would and would not, be beneficial 4. Referral: Brief Counseling/Psychotherapy 5. From scale of 1-10, how likely are you to follow plan: 8  Woc-Behavioral Health Clinician  Behavioral Health Clinician  Warmhandoff: no   [facility] 09/02/2016 08/04/2016 07/29/2016 07/22/2016 05/21/2016  Decreased Interest 2 3 3 2 3   Down, Depressed, Hopeless 2 2 2 2 3   PHQ - 2 Score 4 5 5 4 6   Altered sleeping 3 3 3 3 3   Tired, decreased energy 3 3 2 3 3   Change in appetite 3 3 2 3 2   Feeling bad or failure about yourself  1 2 2 2 3   Trouble concentrating 1 2 2 2 3   Moving slowly or fidgety/restless 0 2 2 2 2   Suicidal thoughts 0 1 0 1 1  PHQ-9 Score 15 21 18 20 23    GAD 7 : Generalized Anxiety Score 09/02/2016 08/04/2016 07/29/2016 07/22/2016  Nervous, Anxious, on Edge 2 3 3 3   Control/stop worrying 2 3 2 3   Worry too much - different things 2 3 2 3   Trouble relaxing 2 3 2 2   Restless 2 2 3 3   Easily annoyed or irritable 1 3 3 3   Afraid - awful might happen 1 2 2 2   Total GAD 7 Score 12 19 17  19

## 2016-09-02 NOTE — Progress Notes (Addendum)
Subjective:  Vanessa Foster is a 30 y.o. G2P1001 at 6075w4d being seen today for ongoing prenatal care.  She is currently monitored for the following issues for this high-risk pregnancy and has Hemorrhoids; Daytime somnolence; Sleep disorder; Cephalalgia; Asthma, mild intermittent; Depressed mood; Chronic nausea; Changing skin lesion; Adjustment disorder with mixed anxiety and depressed mood; Supervision of high-risk pregnancy; History of cesarean delivery, antepartum; Gestational diabetes; and Anemia of mother in pregnancy, antepartum on her problem list.  Patient reports no complaints.  Contractions: Irregular. Vag. Bleeding: None.  Movement: Present. Denies leaking of fluid.   The following portions of the patient's history were reviewed and updated as appropriate: allergies, current medications, past family history, past medical history, past social history, past surgical history and problem list. Problem list updated.  Objective:   Vitals:   09/02/16 0911  BP: 110/73  Pulse: (!) 109  Weight: 218 lb 4.8 oz (99 kg)    Fetal Status: Fetal Heart Rate (bpm): 150   Movement: Present     General:  Alert, oriented and cooperative. Patient is in no acute distress.  Skin: Skin is warm and dry. No rash noted.   Cardiovascular: Normal heart rate noted  Respiratory: Normal respiratory effort, no problems with respiration noted  Abdomen: Soft, gravid, appropriate for gestational age   Pelvic:       Extremities: Normal range of motion.  Edema: Trace  Mental Status: Normal mood and affect. Normal behavior. Normal judgment and thought content.   Urinalysis:      Assessment and Plan:  Pregnancy: G2P1001 at 7975w4d  1. Gestational diabetes mellitus (GDM) in third trimester controlled on oral hypoglycemic drug Pt has had a severe tooth ache and has not been eating much the last week. Thus she has not been checking her Bs. FSBS today was 156 but just drank fruit punch. Pt has appt with dentist  tomorrow. Importance of continuing to eat and check BS reviewed with pt. Diet modification reviewed with pt - GC/Chlamydia probe amp ([place]not at [facility] - Culture, beta strep (group b only)  2. Supervision of high risk pregnancy in third trimester  - GC/Chlamydia probe amp ([place])not at [facility] - Culture, beta strep (group b only)  3. History of cesarean delivery, antepartum TOLAC consent signed  Preterm labor symptoms and general obstetric precautions including but not limited to vaginal bleeding, contractions, leaking of fluid and fetal movement were reviewed in detail with the patient. Please refer to After Visit Summary for other counseling recommendations.  Return in about 1 week (around 09/09/2016) for OB visit.   Hermina StaggersMichael L Ervin, MD

## 2016-09-03 LAB — GC/CHLAMYDIA PROBE AMP (~~LOC~~) NOT AT ARMC
Chlamydia: NEGATIVE
Neisseria Gonorrhea: NEGATIVE

## 2016-09-05 LAB — CULTURE, BETA STREP (GROUP B ONLY)

## 2016-09-08 ENCOUNTER — Emergency Department (HOSPITAL_COMMUNITY)
Admission: EM | Admit: 2016-09-08 | Discharge: 2016-09-08 | Disposition: A | Discharge status: Home / Self Care | Payer: Managed Care, Other (non HMO) | Attending: Emergency Medicine | Admitting: Emergency Medicine

## 2016-09-08 ENCOUNTER — Encounter (HOSPITAL_COMMUNITY): Payer: Self-pay | Admitting: Emergency Medicine

## 2016-09-08 DIAGNOSIS — Z79899 Other long term (current) drug therapy: Secondary | ICD-10-CM | POA: Insufficient documentation

## 2016-09-08 DIAGNOSIS — Z87891 Personal history of nicotine dependence: Secondary | ICD-10-CM | POA: Insufficient documentation

## 2016-09-08 DIAGNOSIS — Z3A38 38 weeks gestation of pregnancy: Secondary | ICD-10-CM | POA: Diagnosis not present

## 2016-09-08 DIAGNOSIS — J45909 Unspecified asthma, uncomplicated: Secondary | ICD-10-CM | POA: Insufficient documentation

## 2016-09-08 DIAGNOSIS — L02411 Cutaneous abscess of right axilla: Secondary | ICD-10-CM

## 2016-09-08 DIAGNOSIS — O99713 Diseases of the skin and subcutaneous tissue complicating pregnancy, third trimester: Secondary | ICD-10-CM | POA: Diagnosis present

## 2016-09-08 MED ORDER — LIDOCAINE HCL (PF) 1 % IJ SOLN
5.0000 mL | Freq: Once | INTRAMUSCULAR | Status: AC
  Administered 2016-09-08: 5 mL
  Filled 2016-09-08: qty 5

## 2016-09-08 MED ORDER — CEPHALEXIN 500 MG PO CAPS: 500.0000 mg | ORAL_CAPSULE | Freq: Four times a day (QID) | ORAL | 0 refills | Status: DC

## 2016-09-08 NOTE — ED Notes (Signed)
ED Provider at bedside. 

## 2016-09-08 NOTE — ED Provider Notes (Signed)
[facility] DEPT Provider Note   CSN: [id number] Arrival date & time: 09/08/16  1541 By signing my name below, I, Vanessa Foster, attest that this documentation has been prepared under the direction and in the presence of Debroah Baller, [place]. Electronically Signed: Georgette Foster, ED Scribe. 09/08/16. 4:55 PM.  History   Chief Complaint Chief Complaint  Patient presents with  . Abscess   HPI Comments: Vanessa Foster is a 30 y.o. female who is [redacted] weeks pregnant with h/o asthma, who presents to the Emergency Department complaining of a moderate, gradually worsening area of pain and swelling to the right axilla onset 5 days ago. Pt states pain is exacerbated with palpation and direct pressure. She has tried warm compresses with minimal relief. Pt has h/o abscesses and notes this feels similar. Denies fever, chills, drainage from the area.   The history is provided by the patient. No language interpreter was used.    Past Medical History:  Diagnosis Date  . Abnormal Pap smear of cervix    2011?  Marland Kitchen Allergy   . Anemia   . Anxiety   . Asthma    hospitalization 2008  . Chronic nausea   . Depression   . Dysmenorrhea   . Dyspareunia   . Gestational diabetes 07/13/2016  . Migraines   . PID (pelvic inflammatory disease)   . Wears glasses     Patient Active Problem List   Diagnosis Date Noted  . Anemia of mother in pregnancy, antepartum 07/29/2016  . Gestational diabetes 07/13/2016  . Supervision of high-risk pregnancy 03/18/2016  . History of cesarean delivery, antepartum 03/18/2016  . Daytime somnolence 07/11/2015  . Sleep disorder 07/11/2015  . Cephalalgia 07/11/2015  . Asthma, mild intermittent 07/11/2015  . Depressed mood 07/11/2015  . Chronic nausea 07/11/2015  . Changing skin lesion 07/11/2015  . Adjustment disorder with mixed anxiety and depressed mood 07/11/2015  . Hemorrhoids 04/30/2014    Past Surgical History:  Procedure Laterality Date  . CESAREAN SECTION    .  implanon     inserted 2010  . WISDOM TOOTH EXTRACTION      OB History    Gravida Para Term Preterm AB Living   '2 1 1     1   ' SAB TAB Ectopic Multiple Live Births           1       Home Medications    Prior to Admission medications   Medication Sig Start Date End Date Taking? Authorizing Provider  ACCU-CHEK FASTCLIX LANCETS MISC 1 Units by Percutaneous route 4 (four) times daily. Patient not taking: Reported on 09/02/2016 07/22/16   Osborne Oman, MD  albuterol (PROVENTIL HFA;VENTOLIN HFA) 108 (90 Base) MCG/ACT inhaler Inhale 2 puffs into the lungs every 6 (six) hours as needed for wheezing or shortness of breath. 06/02/16   Jenne Pane Degele, MD  beclomethasone (QVAR) 80 MCG/ACT inhaler Inhale 2 puffs into the lungs 2 (two) times daily. 08/19/16   Aletha Halim, MD  blood glucose meter kit and supplies Dispense based on patient and insurance preference. Use up to four times daily as directed. (FOR ICD-9 250.00, 250.01).use accucheck nano. 07/23/16   Osborne Oman, MD  cephALEXin (KEFLEX) 500 MG capsule Take 1 capsule (500 mg total) by mouth 4 (four) times daily. 09/08/16   Kherington Meraz Bunnie Pion, NP  ferrous sulfate (FERROUSUL) 325 (65 FE) MG tablet Take 1 tablet (325 mg total) by mouth daily with breakfast. Patient not taking: Reported on 09/02/2016  08/04/16   Luvenia Redden, PA-C  glucose blood (ACCU-CHEK SMARTVIEW) test strip Use as instructed to check blood sugars Patient not taking: Reported on 09/02/2016 07/22/16   Osborne Oman, MD  Prenatal Multivit-Min-Fe-FA (PRENATAL VITAMINS PO) Take 1 tablet by mouth daily.    Historical Provider, MD    Family History Family History  Problem Relation Age of Onset  . Hypertension Mother   . Heart disease Mother     early 63s  . Diabetes Mother   . Diabetes Paternal Grandmother   . Stroke Paternal Grandmother   . Other Father     unknown    Social History Social History  Substance Use Topics  . Smoking status: Former Smoker    Quit  date: 03/20/2008  . Smokeless tobacco: Never Used  . Alcohol use No     Allergies   Patient has no known allergies.   Review of Systems Review of Systems  Constitutional: Negative for chills and fever.  Skin: Positive for wound.  All other systems reviewed and are negative.    Physical Exam Updated Vital Signs BP 120/71   Pulse 99   Temp 98 F (36.7 C)   Resp 15   LMP 11/20/2015 (LMP Unknown)   SpO2 99%   Physical Exam  Constitutional: She appears well-developed and well-nourished. No distress.  HENT:  Head: Normocephalic.  Eyes: Conjunctivae are normal.  Neck: Neck supple.  Cardiovascular: Normal rate.   Pulmonary/Chest: Effort normal. No respiratory distress.  Abdominal:  Gravid at [redacted] weeks gestation  Musculoskeletal:  Right axilla with abscess  Neurological: She is alert.  Skin: Skin is warm and dry.  3 cm, raised, tender, fluctuant area to the right axilla.  Psychiatric: She has a normal mood and affect. Her behavior is normal.  Nursing note and vitals reviewed.  ED Treatments / Results  DIAGNOSTIC STUDIES: Oxygen Saturation is 98% on RA, normal by my interpretation.    COORDINATION OF CARE: 4:53 PM Discussed treatment plan with pt at bedside which includes I&D and pt agreed to plan.  Labs (all labs ordered are listed, but only abnormal results are displayed) Labs Reviewed - No data to display  EKG  EKG Interpretation None       Radiology No results found.  Procedures .Marland KitchenIncision and Drainage Date/Time: 09/08/2016 4:55 PM Performed by: Ashley Murrain Authorized by: Ashley Murrain   Consent:    Consent obtained:  Verbal   Consent given by:  Patient   Risks discussed:  Incomplete drainage   Alternatives discussed:  No treatment and alternative treatment Location:    Type:  Abscess   Size:  3 cm   Location:  Upper extremity   Upper extremity location:  Arm   Arm location:  R upper arm (right axilla) Pre-procedure details:    Skin  preparation:  Betadine Anesthesia (see MAR for exact dosages):    Anesthesia method:  Local infiltration   Local anesthetic:  Lidocaine 1% w/o epi Procedure type:    Complexity:  Complex Procedure details:    Needle aspiration: no     Incision types:  Single straight   Incision depth:  Dermal   Scalpel blade:  11   Wound management:  Probed and deloculated and irrigated with saline   Drainage:  Purulent   Drainage amount:  Copious   Packing materials:  1/4 in iodoform gauze Post-procedure details:    Patient tolerance of procedure:  Tolerated well, no immediate complications  Medications Ordered in ED Medications  lidocaine (PF) (XYLOCAINE) 1 % injection 5 mL (5 mLs Infiltration Given 09/08/16 1705)     Initial Impression / Assessment and Plan / ED Course  I have reviewed the triage vital signs and the nursing notes.  Clinical Course     Final Clinical Impressions(s) / ED Diagnoses   Final diagnoses:  Abscess of axilla, right   Patient with skin abscess. Incision and drainage performed in the ED today. Follow up with her OB-GYN in 2 days for packing removal. Supportive care and return precautions discussed. Pt sent home with antibiotics. The patient appears reasonably screened and/or stabilized for discharge and I doubt any other emergent medical condition requiring further screening, evaluation, or treatment in the ED prior to discharge.  New Prescriptions Discharge Medication List as of 09/08/2016  5:35 PM    START taking these medications   Details  cephALEXin (KEFLEX) 500 MG capsule Take 1 capsule (500 mg total) by mouth 4 (four) times daily., Starting Tue 09/08/2016, Print       I personally performed the services described in this documentation, which was scribed in my presence. The recorded information has been reviewed and is accurate.     [address] [place], [place] 09/09/16 2003    Duffy Bruce, MD 09/09/16 2103

## 2016-09-08 NOTE — Discharge Instructions (Signed)
When you see Dr. Macon LargeAnyanwu in 2 days have her remove the packing and recheck your wound.

## 2016-09-08 NOTE — ED Triage Notes (Signed)
Pt sts abscess under right arm x 5 days; pt is [redacted] weeks pregnant

## 2016-09-10 ENCOUNTER — Ambulatory Visit (INDEPENDENT_AMBULATORY_CARE_PROVIDER_SITE_OTHER): Payer: Managed Care, Other (non HMO) | Admitting: Obstetrics & Gynecology

## 2016-09-10 VITALS — BP 117/75 | HR 91 | Wt 219.6 lb

## 2016-09-10 DIAGNOSIS — O24410 Gestational diabetes mellitus in pregnancy, diet controlled: Secondary | ICD-10-CM

## 2016-09-10 DIAGNOSIS — O0993 Supervision of high risk pregnancy, unspecified, third trimester: Secondary | ICD-10-CM

## 2016-09-10 LAB — GLUCOSE, CAPILLARY: Glucose-Capillary: 147 mg/dL — ABNORMAL HIGH (ref 65–99)

## 2016-09-10 MED ORDER — SERTRALINE HCL 50 MG PO TABS: 50.0000 mg | ORAL_TABLET | Freq: Every day | ORAL | 2 refills | Status: DC

## 2016-09-10 NOTE — Progress Notes (Signed)
OB US scheduled for 09/18/16 @ 245pm.

## 2016-09-10 NOTE — Progress Notes (Signed)
   PRENATAL VISIT NOTE  Subjective:  Vanessa Foster is a 30 y.o. G2P1001 at 422w5d being seen today for ongoing prenatal care.  She is currently monitored for the following issues for this high-risk pregnancy and has Hemorrhoids; Daytime somnolence; Sleep disorder; Cephalalgia; Asthma, mild intermittent; Depressed mood; Chronic nausea; Changing skin lesion; Adjustment disorder with mixed anxiety and depressed mood; Supervision of high-risk pregnancy; History of cesarean delivery, antepartum; Gestational diabetes; and Anemia of mother in pregnancy, antepartum on her problem list.  Patient reports depression is worse.  Pt doesn't want to talk about it.  She doesn't want to take medications.  .Contractions: Irregular. Vag. Bleeding: None.  Movement: Present. Denies leaking of fluid.   The following portions of the patient's history were reviewed and updated as appropriate: allergies, current medications, past family history, past medical history, past social history, past surgical history and problem list. Problem list updated.  Objective:   Vitals:   09/10/16 0847  BP: 117/75  Pulse: 91  Weight: 219 lb 9.6 oz (99.6 kg)    Fetal Status: Fetal Heart Rate (bpm): 156   Movement: Present     General:  Alert, oriented and cooperative. Patient is in no acute distress.  Skin: Skin is warm and dry. No rash noted.   Cardiovascular: Normal heart rate noted  Respiratory: Normal respiratory effort, no problems with respiration noted  Abdomen: Soft, gravid, appropriate for gestational age. Pain/Pressure: Present     Pelvic:  Cervical exam deferred        Extremities: Normal range of motion.  Edema: Trace  Mental Status: Normal mood and affect. Normal behavior. Normal judgment and thought content.   Assessment and Plan:  Pregnancy: G2P1001 at 482w5d  1. Depression -See above note.  Zoloft prescribed.  Pt may take.  Pt doesn't want to talk with anyone today.  No HI/SI  2. Diet controlled  gestational diabetes mellitus (GDM) in third trimester -Pt not checking blood sugars because she doesn't feel like it.  She agrees to restart.  She understands fetal death with hyperglycemia.  cbg=147 50 minutes post prandial - US MFM OB FOLLOW UP; Future Fundal height 40-41 Pt wants VBAC, needs EFW   Term labor symptoms and general obstetric precautions including but not limited to vaginal bleeding, contractions, leaking of fluid and fetal movement were reviewed in detail with the patient. Please refer to After Visit Summary for other counseling recommendations.  Return in 1 week (on 09/17/2016).   Lesly DukesKelly H Cortlynn Hollinsworth, MD

## 2016-09-16 ENCOUNTER — Ambulatory Visit (INDEPENDENT_AMBULATORY_CARE_PROVIDER_SITE_OTHER): Payer: Managed Care, Other (non HMO) | Admitting: Obstetrics & Gynecology

## 2016-09-16 ENCOUNTER — Encounter: Payer: Self-pay | Admitting: Obstetrics & Gynecology

## 2016-09-16 VITALS — BP 123/81 | HR 103 | Wt 221.2 lb

## 2016-09-16 DIAGNOSIS — O0993 Supervision of high risk pregnancy, unspecified, third trimester: Secondary | ICD-10-CM

## 2016-09-16 DIAGNOSIS — O368130 Decreased fetal movements, third trimester, not applicable or unspecified: Secondary | ICD-10-CM

## 2016-09-16 DIAGNOSIS — O24419 Gestational diabetes mellitus in pregnancy, unspecified control: Secondary | ICD-10-CM

## 2016-09-16 NOTE — Progress Notes (Signed)
Patient not checking sugars or taking any meds. C/o depression, no s/i, not taking zoloft. Does not want to see Asher MuirJamie. C/o toothache Has seen a dentist and they deferred tooth extraction   PRENATAL VISIT NOTE  Subjective:  Vanessa Foster is a 30 y.o. G2P1001 at 2274w4d being seen today for ongoing prenatal care.  She is currently monitored for the following issues for this high-risk pregnancy and has Hemorrhoids; Daytime somnolence; Sleep disorder; Cephalalgia; Asthma, mild intermittent; Depressed mood; Chronic nausea; Changing skin lesion; Adjustment disorder with mixed anxiety and depressed mood; Supervision of high-risk pregnancy; History of cesarean delivery, antepartum; Gestational diabetes; and Anemia of mother in pregnancy, antepartum on her problem list.  Patient reports depression, not recording BG or taking any meds.  Contractions: Irregular. Vag. Bleeding: None.  Movement: (!) Decreased. Denies leaking of fluid.   The following portions of the patient's history were reviewed and updated as appropriate: allergies, current medications, past family history, past medical history, past social history, past surgical history and problem list. Problem list updated.  Objective:   Vitals:   09/16/16 1514  BP: 123/81  Pulse: (!) 103  Weight: 221 lb 3.2 oz (100.3 kg)    Fetal Status: Fetal Heart Rate (bpm): 158   Movement: (!) Decreased     General:  Alert, oriented and cooperative. Patient is in no acute distress.  Skin: Skin is warm and dry. No rash noted.   Cardiovascular: Normal heart rate noted  Respiratory: Normal respiratory effort, no problems with respiration noted  Abdomen: Soft, gravid, appropriate for gestational age. Pain/Pressure: Present     Pelvic:  Cervical exam deferred        Extremities: Normal range of motion.  Edema: Trace  Mental Status: Normal mood and affect. Normal behavior. Normal judgment and thought content.   Assessment and Plan:  Pregnancy:  G2P1001 at 7474w4d  1. Supervision of high risk pregnancy in third trimester Non compliance with BG management, will schedule IOL at 39 weeks, TOLAC discussed  2. Gestational diabetes mellitus (GDM) in third trimester, gestational diabetes method of control unspecified Non compliant  Term labor symptoms and general obstetric precautions including but not limited to vaginal bleeding, contractions, leaking of fluid and fetal movement were reviewed in detail with the patient. Please refer to After Visit Summary for other counseling recommendations.  Return in about 6 weeks (around 10/28/2016), or postpartum.   Adam PhenixJames G Nahum Sherrer, MD

## 2016-09-16 NOTE — Progress Notes (Signed)
Patient not checking sugars or taking any meds. C/o depression, no s/i, not taking zoloft. Does not want to see Asher MuirJamie. C/o toothache

## 2016-09-16 NOTE — Progress Notes (Signed)
Normal patterns of FM for this EGA discussed.  Pt advised to monitor closely and to return to hospital if decreased FM re-occurs. She voiced understanding.

## 2016-09-17 ENCOUNTER — Encounter (HOSPITAL_COMMUNITY): Payer: Self-pay

## 2016-09-17 ENCOUNTER — Inpatient Hospital Stay (HOSPITAL_COMMUNITY)
Admission: AD | Admit: 2016-09-17 | Discharge: 2016-09-17 | Disposition: A | Discharge status: Home / Self Care | Payer: Managed Care, Other (non HMO) | Source: Ambulatory Visit | Attending: Obstetrics and Gynecology | Admitting: Obstetrics and Gynecology

## 2016-09-17 ENCOUNTER — Encounter (HOSPITAL_COMMUNITY): Payer: Self-pay | Admitting: *Deleted

## 2016-09-17 ENCOUNTER — Telehealth (HOSPITAL_COMMUNITY): Payer: Self-pay | Admitting: *Deleted

## 2016-09-17 NOTE — Progress Notes (Addendum)
G2P1 @ 38.[redacted] wksga. Presents to triage for ctx. Denies LOF or bleeding. +FM noted. External monitors applied. VSS see flow sheet for details  Hx of previous c/s for fetal distress. Plans for TOLAC. GDM non compliant. Depression and declines to take meds. Enemic? Pt states suppose to take Fe pills but have not.   0230: Provider notified. Report status of pt given. Orders received to recheck cervix in 1.5 from last SVE.   0332: SVE unchanged. 1/thick/high. Pt states that she is able to sleep through ctx.   0340: provider notified of SVE unchanged. Orders received to dischage pt  Home with  Labor precaution.   [id number]: ischarged instructions  given with pt understanding. Pt left unit with So ambulatory.

## 2016-09-17 NOTE — Telephone Encounter (Signed)
Preadmission screen  

## 2016-09-18 ENCOUNTER — Ambulatory Visit (HOSPITAL_COMMUNITY)
Admission: RE | Admit: 2016-09-18 | Discharge: 2016-09-18 | Disposition: A | Discharge status: Home / Self Care | Payer: Managed Care, Other (non HMO) | Source: Ambulatory Visit | Attending: Obstetrics & Gynecology | Admitting: Obstetrics & Gynecology

## 2016-09-18 DIAGNOSIS — Z362 Encounter for other antenatal screening follow-up: Secondary | ICD-10-CM

## 2016-09-18 DIAGNOSIS — O34211 Maternal care for low transverse scar from previous cesarean delivery: Secondary | ICD-10-CM

## 2016-09-18 DIAGNOSIS — Z3A38 38 weeks gestation of pregnancy: Secondary | ICD-10-CM

## 2016-09-18 DIAGNOSIS — O24419 Gestational diabetes mellitus in pregnancy, unspecified control: Secondary | ICD-10-CM | POA: Insufficient documentation

## 2016-09-18 DIAGNOSIS — O99343 Other mental disorders complicating pregnancy, third trimester: Secondary | ICD-10-CM

## 2016-09-18 DIAGNOSIS — O99213 Obesity complicating pregnancy, third trimester: Secondary | ICD-10-CM | POA: Insufficient documentation

## 2016-09-18 DIAGNOSIS — O24410 Gestational diabetes mellitus in pregnancy, diet controlled: Secondary | ICD-10-CM

## 2016-09-19 ENCOUNTER — Encounter (HOSPITAL_COMMUNITY)
Admission: RE | Disposition: A | Discharge status: Home / Self Care | Payer: Self-pay | Source: Ambulatory Visit | Attending: Obstetrics and Gynecology

## 2016-09-19 ENCOUNTER — Encounter (HOSPITAL_COMMUNITY): Payer: Self-pay

## 2016-09-19 ENCOUNTER — Inpatient Hospital Stay (HOSPITAL_COMMUNITY): Admit: 2016-09-19 | Payer: Self-pay

## 2016-09-19 ENCOUNTER — Inpatient Hospital Stay (HOSPITAL_COMMUNITY): Payer: Managed Care, Other (non HMO) | Admitting: Anesthesiology

## 2016-09-19 ENCOUNTER — Inpatient Hospital Stay (HOSPITAL_COMMUNITY)
Admission: RE | Admit: 2016-09-19 | Discharge: 2016-09-22 | DRG: 765 | Disposition: A | Discharge status: Home / Self Care | Payer: Managed Care, Other (non HMO) | Source: Ambulatory Visit | Attending: Obstetrics and Gynecology | Admitting: Obstetrics and Gynecology

## 2016-09-19 DIAGNOSIS — O3663X0 Maternal care for excessive fetal growth, third trimester, not applicable or unspecified: Secondary | ICD-10-CM | POA: Diagnosis present

## 2016-09-19 DIAGNOSIS — O9962 Diseases of the digestive system complicating childbirth: Secondary | ICD-10-CM | POA: Diagnosis present

## 2016-09-19 DIAGNOSIS — J4520 Mild intermittent asthma, uncomplicated: Secondary | ICD-10-CM | POA: Diagnosis present

## 2016-09-19 DIAGNOSIS — Z8249 Family history of ischemic heart disease and other diseases of the circulatory system: Secondary | ICD-10-CM | POA: Diagnosis not present

## 2016-09-19 DIAGNOSIS — Z9119 Patient's noncompliance with other medical treatment and regimen: Secondary | ICD-10-CM

## 2016-09-19 DIAGNOSIS — Z833 Family history of diabetes mellitus: Secondary | ICD-10-CM | POA: Diagnosis not present

## 2016-09-19 DIAGNOSIS — Z6841 Body Mass Index (BMI) 40.0 and over, adult: Secondary | ICD-10-CM

## 2016-09-19 DIAGNOSIS — K219 Gastro-esophageal reflux disease without esophagitis: Secondary | ICD-10-CM | POA: Diagnosis present

## 2016-09-19 DIAGNOSIS — O99214 Obesity complicating childbirth: Secondary | ICD-10-CM | POA: Diagnosis present

## 2016-09-19 DIAGNOSIS — E662 Morbid (severe) obesity with alveolar hypoventilation: Secondary | ICD-10-CM | POA: Diagnosis present

## 2016-09-19 DIAGNOSIS — Z823 Family history of stroke: Secondary | ICD-10-CM | POA: Diagnosis not present

## 2016-09-19 DIAGNOSIS — O34219 Maternal care for unspecified type scar from previous cesarean delivery: Secondary | ICD-10-CM

## 2016-09-19 DIAGNOSIS — O24429 Gestational diabetes mellitus in childbirth, unspecified control: Secondary | ICD-10-CM | POA: Diagnosis present

## 2016-09-19 DIAGNOSIS — Z87891 Personal history of nicotine dependence: Secondary | ICD-10-CM

## 2016-09-19 DIAGNOSIS — O9952 Diseases of the respiratory system complicating childbirth: Secondary | ICD-10-CM | POA: Diagnosis present

## 2016-09-19 DIAGNOSIS — O24419 Gestational diabetes mellitus in pregnancy, unspecified control: Secondary | ICD-10-CM

## 2016-09-19 DIAGNOSIS — O34211 Maternal care for low transverse scar from previous cesarean delivery: Secondary | ICD-10-CM | POA: Diagnosis present

## 2016-09-19 DIAGNOSIS — O0993 Supervision of high risk pregnancy, unspecified, third trimester: Secondary | ICD-10-CM

## 2016-09-19 DIAGNOSIS — Z3A39 39 weeks gestation of pregnancy: Secondary | ICD-10-CM

## 2016-09-19 LAB — RPR: RPR Ser Ql: NONREACTIVE

## 2016-09-19 LAB — CBC
HCT: 32 % — ABNORMAL LOW (ref 36.0–46.0)
Hemoglobin: 10.4 g/dL — ABNORMAL LOW (ref 12.0–15.0)
MCH: 24.5 pg — ABNORMAL LOW (ref 26.0–34.0)
MCHC: 32.5 g/dL (ref 30.0–36.0)
MCV: 75.5 fL — ABNORMAL LOW (ref 78.0–100.0)
Platelets: 267 10*3/uL (ref 150–400)
RBC: 4.24 MIL/uL (ref 3.87–5.11)
RDW: 16.8 % — ABNORMAL HIGH (ref 11.5–15.5)
WBC: 10.4 10*3/uL (ref 4.0–10.5)

## 2016-09-19 LAB — TYPE AND SCREEN
ABO/RH(D): O POS
Antibody Screen: NEGATIVE

## 2016-09-19 LAB — GLUCOSE, CAPILLARY
Glucose-Capillary: 117 mg/dL — ABNORMAL HIGH (ref 65–99)
Glucose-Capillary: 74 mg/dL (ref 65–99)
Glucose-Capillary: 75 mg/dL (ref 65–99)
Glucose-Capillary: 80 mg/dL (ref 65–99)
Glucose-Capillary: 85 mg/dL (ref 65–99)
Glucose-Capillary: 86 mg/dL (ref 65–99)
Glucose-Capillary: 86 mg/dL (ref 65–99)

## 2016-09-19 LAB — ABO/RH: ABO/RH(D): O POS

## 2016-09-19 SURGERY — Surgical Case
Anesthesia: Epidural | Wound class: Clean Contaminated

## 2016-09-19 MED ORDER — SOD CITRATE-CITRIC ACID 500-334 MG/5ML PO SOLN
30.0000 mL | ORAL | Status: AC
  Administered 2016-09-19: 30 mL via ORAL

## 2016-09-19 MED ORDER — PRENATAL MULTIVITAMIN CH
1.0000 | ORAL_TABLET | Freq: Every day | ORAL | Status: DC
  Administered 2016-09-20 – 2016-09-22 (×3): 1 via ORAL
  Filled 2016-09-19 (×4): qty 1

## 2016-09-19 MED ORDER — OXYCODONE HCL 5 MG PO TABS
10.0000 mg | ORAL_TABLET | ORAL | Status: DC | PRN
  Administered 2016-09-21 (×2): 10 mg via ORAL
  Filled 2016-09-19 (×2): qty 2

## 2016-09-19 MED ORDER — ONDANSETRON HCL 4 MG/2ML IJ SOLN
INTRAMUSCULAR | Status: DC | PRN
  Administered 2016-09-19: 4 mg via INTRAVENOUS

## 2016-09-19 MED ORDER — KETOROLAC TROMETHAMINE 30 MG/ML IJ SOLN
INTRAMUSCULAR | Status: AC
  Filled 2016-09-19: qty 1

## 2016-09-19 MED ORDER — ONDANSETRON HCL 4 MG/2ML IJ SOLN: 4.0000 mg | Freq: Three times a day (TID) | INTRAMUSCULAR | Status: DC | PRN

## 2016-09-19 MED ORDER — SODIUM BICARBONATE 8.4 % IV SOLN
INTRAVENOUS | Status: AC
  Filled 2016-09-19: qty 50

## 2016-09-19 MED ORDER — LACTATED RINGERS IV SOLN
INTRAVENOUS | Status: DC | PRN
  Administered 2016-09-19: 40 [IU] via INTRAVENOUS

## 2016-09-19 MED ORDER — SCOPOLAMINE 1 MG/3DAYS TD PT72
MEDICATED_PATCH | TRANSDERMAL | Status: DC | PRN
  Administered 2016-09-19: 1 via TRANSDERMAL

## 2016-09-19 MED ORDER — COCONUT OIL OIL
1.0000 "application " | TOPICAL_OIL | Status: DC | PRN
  Filled 2016-09-19: qty 120

## 2016-09-19 MED ORDER — FENTANYL 2.5 MCG/ML BUPIVACAINE 1/10 % EPIDURAL INFUSION (WH - ANES)
14.0000 mL/h | INTRAMUSCULAR | Status: DC | PRN
  Administered 2016-09-19 (×2): 14 mL/h via EPIDURAL
  Filled 2016-09-19 (×2): qty 100

## 2016-09-19 MED ORDER — ACETAMINOPHEN 325 MG PO TABS
650.0000 mg | ORAL_TABLET | ORAL | Status: DC | PRN
  Administered 2016-09-21 – 2016-09-22 (×3): 650 mg via ORAL
  Filled 2016-09-19 (×3): qty 2

## 2016-09-19 MED ORDER — LACTATED RINGERS IV SOLN
INTRAVENOUS | Status: DC
  Administered 2016-09-20 (×2): via INTRAVENOUS

## 2016-09-19 MED ORDER — MORPHINE SULFATE (PF) 0.5 MG/ML IJ SOLN
INTRAMUSCULAR | Status: DC | PRN
  Administered 2016-09-19: 4 mg via EPIDURAL

## 2016-09-19 MED ORDER — METOCLOPRAMIDE HCL 5 MG/ML IJ SOLN
INTRAMUSCULAR | Status: AC
  Filled 2016-09-19: qty 2

## 2016-09-19 MED ORDER — SENNOSIDES-DOCUSATE SODIUM 8.6-50 MG PO TABS
2.0000 | ORAL_TABLET | Freq: Every evening | ORAL | Status: DC | PRN
  Administered 2016-09-20 – 2016-09-22 (×2): 2 via ORAL
  Filled 2016-09-19 (×3): qty 2

## 2016-09-19 MED ORDER — LACTATED RINGERS IV SOLN: 500.0000 mL | INTRAVENOUS | Status: DC | PRN

## 2016-09-19 MED ORDER — TETANUS-DIPHTH-ACELL PERTUSSIS 5-2.5-18.5 LF-MCG/0.5 IM SUSP
0.5000 mL | Freq: Once | INTRAMUSCULAR | Status: DC
  Filled 2016-09-19: qty 0.5

## 2016-09-19 MED ORDER — NALOXONE HCL 2 MG/2ML IJ SOSY
1.0000 ug/kg/h | PREFILLED_SYRINGE | INTRAVENOUS | Status: DC | PRN
  Filled 2016-09-19: qty 2

## 2016-09-19 MED ORDER — KETOROLAC TROMETHAMINE 30 MG/ML IJ SOLN
30.0000 mg | Freq: Four times a day (QID) | INTRAMUSCULAR | Status: AC | PRN
  Administered 2016-09-19: 30 mg via INTRAMUSCULAR

## 2016-09-19 MED ORDER — PHENYLEPHRINE 40 MCG/ML (10ML) SYRINGE FOR IV PUSH (FOR BLOOD PRESSURE SUPPORT): 80.0000 ug | PREFILLED_SYRINGE | INTRAVENOUS | Status: DC | PRN

## 2016-09-19 MED ORDER — DIBUCAINE 1 % RE OINT
1.0000 "application " | TOPICAL_OINTMENT | RECTAL | Status: DC | PRN
  Filled 2016-09-19: qty 28

## 2016-09-19 MED ORDER — OXYTOCIN 40 UNITS IN LACTATED RINGERS INFUSION - SIMPLE MED
1.0000 m[IU]/min | INTRAVENOUS | Status: DC
  Administered 2016-09-19: 1 m[IU]/min via INTRAVENOUS

## 2016-09-19 MED ORDER — ACETAMINOPHEN 500 MG PO TABS
1000.0000 mg | ORAL_TABLET | Freq: Four times a day (QID) | ORAL | Status: AC
  Administered 2016-09-20 (×3): 1000 mg via ORAL
  Filled 2016-09-19 (×3): qty 2

## 2016-09-19 MED ORDER — LACTATED RINGERS IV SOLN
INTRAVENOUS | Status: DC
Start: 2016-09-19 — End: 2016-09-19
  Administered 2016-09-19: 15:00:00 via INTRAVENOUS

## 2016-09-19 MED ORDER — METOCLOPRAMIDE HCL 5 MG/ML IJ SOLN
10.0000 mg | Freq: Once | INTRAMUSCULAR | Status: AC
  Administered 2016-09-19: 10 mg via INTRAVENOUS

## 2016-09-19 MED ORDER — NALBUPHINE HCL 10 MG/ML IJ SOLN: 5.0000 mg | Freq: Once | INTRAMUSCULAR | Status: DC | PRN

## 2016-09-19 MED ORDER — LIDOCAINE-EPINEPHRINE (PF) 2 %-1:200000 IJ SOLN
INTRAMUSCULAR | Status: AC
  Filled 2016-09-19: qty 20

## 2016-09-19 MED ORDER — OXYTOCIN BOLUS FROM INFUSION: 500.0000 mL | Freq: Once | INTRAVENOUS | Status: DC

## 2016-09-19 MED ORDER — KETOROLAC TROMETHAMINE 30 MG/ML IJ SOLN: 30.0000 mg | Freq: Four times a day (QID) | INTRAMUSCULAR | Status: AC | PRN

## 2016-09-19 MED ORDER — MENTHOL 3 MG MT LOZG
1.0000 | LOZENGE | OROMUCOSAL | Status: DC | PRN
  Filled 2016-09-19: qty 9

## 2016-09-19 MED ORDER — OXYTOCIN 40 UNITS IN LACTATED RINGERS INFUSION - SIMPLE MED: 2.5000 [IU]/h | INTRAVENOUS | Status: AC

## 2016-09-19 MED ORDER — CEFAZOLIN SODIUM-DEXTROSE 2-4 GM/100ML-% IV SOLN
INTRAVENOUS | Status: AC
  Filled 2016-09-19: qty 100

## 2016-09-19 MED ORDER — IBUPROFEN 600 MG PO TABS
600.0000 mg | ORAL_TABLET | Freq: Four times a day (QID) | ORAL | Status: DC | PRN
  Administered 2016-09-22: 600 mg via ORAL

## 2016-09-19 MED ORDER — NALBUPHINE HCL 10 MG/ML IJ SOLN: 5.0000 mg | INTRAMUSCULAR | Status: DC | PRN

## 2016-09-19 MED ORDER — LACTATED RINGERS IV SOLN
INTRAVENOUS | Status: DC | PRN
  Administered 2016-09-19: 19:00:00 via INTRAVENOUS

## 2016-09-19 MED ORDER — NALOXONE HCL 0.4 MG/ML IJ SOLN: 0.4000 mg | INTRAMUSCULAR | Status: DC | PRN

## 2016-09-19 MED ORDER — ONDANSETRON HCL 4 MG/2ML IJ SOLN
INTRAMUSCULAR | Status: AC
  Filled 2016-09-19: qty 2

## 2016-09-19 MED ORDER — FENTANYL CITRATE (PF) 100 MCG/2ML IJ SOLN
50.0000 ug | INTRAMUSCULAR | Status: DC | PRN
  Filled 2016-09-19: qty 2

## 2016-09-19 MED ORDER — MEPERIDINE HCL 25 MG/ML IJ SOLN: 6.2500 mg | INTRAMUSCULAR | Status: DC | PRN

## 2016-09-19 MED ORDER — LACTATED RINGERS IV SOLN
500.0000 mL | Freq: Once | INTRAVENOUS | Status: AC
  Administered 2016-09-19: 500 mL via INTRAVENOUS

## 2016-09-19 MED ORDER — OXYTOCIN 40 UNITS IN LACTATED RINGERS INFUSION - SIMPLE MED
2.5000 [IU]/h | INTRAVENOUS | Status: DC
  Filled 2016-09-19: qty 1000

## 2016-09-19 MED ORDER — DIPHENHYDRAMINE HCL 50 MG/ML IJ SOLN: 12.5000 mg | INTRAMUSCULAR | Status: DC | PRN

## 2016-09-19 MED ORDER — SODIUM CHLORIDE 0.9% FLUSH: 3.0000 mL | INTRAVENOUS | Status: DC | PRN

## 2016-09-19 MED ORDER — FENTANYL CITRATE (PF) 100 MCG/2ML IJ SOLN: 25.0000 ug | INTRAMUSCULAR | Status: DC | PRN

## 2016-09-19 MED ORDER — WITCH HAZEL-GLYCERIN EX PADS: 1.0000 "application " | MEDICATED_PAD | CUTANEOUS | Status: DC | PRN

## 2016-09-19 MED ORDER — LACTATED RINGERS IV SOLN
INTRAVENOUS | Status: DC | PRN
  Administered 2016-09-19 (×2): via INTRAVENOUS

## 2016-09-19 MED ORDER — LIDOCAINE HCL (PF) 1 % IJ SOLN: 30.0000 mL | INTRAMUSCULAR | Status: DC | PRN

## 2016-09-19 MED ORDER — DIPHENHYDRAMINE HCL 25 MG PO CAPS
25.0000 mg | ORAL_CAPSULE | Freq: Four times a day (QID) | ORAL | Status: DC | PRN
  Filled 2016-09-19: qty 1

## 2016-09-19 MED ORDER — EPHEDRINE 5 MG/ML INJ: 10.0000 mg | INTRAVENOUS | Status: DC | PRN

## 2016-09-19 MED ORDER — DIPHENHYDRAMINE HCL 25 MG PO CAPS
25.0000 mg | ORAL_CAPSULE | ORAL | Status: DC | PRN
  Filled 2016-09-19: qty 1

## 2016-09-19 MED ORDER — POLYETHYLENE GLYCOL 3350 17 G PO PACK
17.0000 g | PACK | Freq: Every day | ORAL | Status: DC
  Administered 2016-09-20 – 2016-09-22 (×3): 17 g via ORAL
  Filled 2016-09-19 (×5): qty 1

## 2016-09-19 MED ORDER — OXYCODONE-ACETAMINOPHEN 5-325 MG PO TABS: 1.0000 | ORAL_TABLET | ORAL | Status: DC | PRN

## 2016-09-19 MED ORDER — OXYCODONE-ACETAMINOPHEN 5-325 MG PO TABS: 2.0000 | ORAL_TABLET | ORAL | Status: DC | PRN

## 2016-09-19 MED ORDER — OXYCODONE HCL 5 MG PO TABS
5.0000 mg | ORAL_TABLET | ORAL | Status: DC | PRN
  Administered 2016-09-20 – 2016-09-22 (×5): 5 mg via ORAL
  Filled 2016-09-19 (×5): qty 1

## 2016-09-19 MED ORDER — LIDOCAINE HCL (PF) 1 % IJ SOLN
INTRAMUSCULAR | Status: DC | PRN
  Administered 2016-09-19: 5 mL via EPIDURAL

## 2016-09-19 MED ORDER — ONDANSETRON HCL 4 MG/2ML IJ SOLN
4.0000 mg | Freq: Four times a day (QID) | INTRAMUSCULAR | Status: DC | PRN
  Administered 2016-09-19: 4 mg via INTRAVENOUS
  Filled 2016-09-19: qty 2

## 2016-09-19 MED ORDER — TERBUTALINE SULFATE 1 MG/ML IJ SOLN
0.2500 mg | Freq: Once | INTRAMUSCULAR | Status: DC | PRN
Start: 2016-09-19 — End: 2016-09-19

## 2016-09-19 MED ORDER — SOD CITRATE-CITRIC ACID 500-334 MG/5ML PO SOLN
30.0000 mL | ORAL | Status: DC | PRN
  Administered 2016-09-19: 30 mL via ORAL
  Filled 2016-09-19: qty 15

## 2016-09-19 MED ORDER — LIDOCAINE-EPINEPHRINE (PF) 2 %-1:200000 IJ SOLN
INTRAMUSCULAR | Status: DC | PRN
  Administered 2016-09-19 (×2): 5 mL via EPIDURAL

## 2016-09-19 MED ORDER — SODIUM CHLORIDE 0.9 % IR SOLN
Status: DC | PRN
  Administered 2016-09-19: 1

## 2016-09-19 MED ORDER — IBUPROFEN 600 MG PO TABS
600.0000 mg | ORAL_TABLET | Freq: Four times a day (QID) | ORAL | Status: DC
  Administered 2016-09-20 – 2016-09-22 (×10): 600 mg via ORAL
  Filled 2016-09-19 (×11): qty 1

## 2016-09-19 MED ORDER — ACETAMINOPHEN 325 MG PO TABS: 650.0000 mg | ORAL_TABLET | ORAL | Status: DC | PRN

## 2016-09-19 MED ORDER — MORPHINE SULFATE (PF) 0.5 MG/ML IJ SOLN
INTRAMUSCULAR | Status: AC
  Filled 2016-09-19: qty 10

## 2016-09-19 MED ORDER — OXYTOCIN 10 UNIT/ML IJ SOLN
INTRAMUSCULAR | Status: AC
  Filled 2016-09-19: qty 4

## 2016-09-19 MED ORDER — FLEET ENEMA 7-19 GM/118ML RE ENEM: 1.0000 | ENEMA | RECTAL | Status: DC | PRN

## 2016-09-19 MED ORDER — SIMETHICONE 80 MG PO CHEW
80.0000 mg | CHEWABLE_TABLET | Freq: Three times a day (TID) | ORAL | Status: DC
  Administered 2016-09-20 – 2016-09-22 (×8): 80 mg via ORAL
  Filled 2016-09-19 (×13): qty 1

## 2016-09-19 MED ORDER — CEFAZOLIN SODIUM-DEXTROSE 2-4 GM/100ML-% IV SOLN
2.0000 g | INTRAVENOUS | Status: AC
  Administered 2016-09-19: 2 g via INTRAVENOUS

## 2016-09-19 MED ORDER — SCOPOLAMINE 1 MG/3DAYS TD PT72
MEDICATED_PATCH | TRANSDERMAL | Status: AC
  Filled 2016-09-19: qty 1

## 2016-09-19 MED ORDER — PHENYLEPHRINE 40 MCG/ML (10ML) SYRINGE FOR IV PUSH (FOR BLOOD PRESSURE SUPPORT)
80.0000 ug | PREFILLED_SYRINGE | INTRAVENOUS | Status: DC | PRN
  Filled 2016-09-19: qty 10

## 2016-09-19 SURGICAL SUPPLY — 31 items
BENZOIN TINCTURE PRP APPL 2/3 (GAUZE/BANDAGES/DRESSINGS) ×2 IMPLANT
CANISTER SUCT 3000ML PPV (MISCELLANEOUS) ×2 IMPLANT
CHLORAPREP W/TINT 26ML (MISCELLANEOUS) ×2 IMPLANT
DERMABOND ADVANCED (GAUZE/BANDAGES/DRESSINGS) ×1
DERMABOND ADVANCED .7 DNX12 (GAUZE/BANDAGES/DRESSINGS) ×1 IMPLANT
DRESSING DISP NPWT PICO 4X12 (MISCELLANEOUS) ×2 IMPLANT
DRSG OPSITE POSTOP 4X10 (GAUZE/BANDAGES/DRESSINGS) ×2 IMPLANT
ELECT REM PT RETURN 9FT ADLT (ELECTROSURGICAL) ×2
ELECTRODE REM PT RTRN 9FT ADLT (ELECTROSURGICAL) ×1 IMPLANT
GLOVE BIOGEL PI IND STRL 7.0 (GLOVE) ×2 IMPLANT
GLOVE BIOGEL PI IND STRL 7.5 (GLOVE) ×1 IMPLANT
GLOVE BIOGEL PI INDICATOR 7.0 (GLOVE) ×2
GLOVE BIOGEL PI INDICATOR 7.5 (GLOVE) ×1
GLOVE SKINSENSE NS SZ7.0 (GLOVE) ×1
GLOVE SKINSENSE STRL SZ7.0 (GLOVE) ×1 IMPLANT
GOWN STRL REUS W/ TWL LRG LVL3 (GOWN DISPOSABLE) ×2 IMPLANT
GOWN STRL REUS W/ TWL XL LVL3 (GOWN DISPOSABLE) ×1 IMPLANT
GOWN STRL REUS W/TWL LRG LVL3 (GOWN DISPOSABLE) ×2
GOWN STRL REUS W/TWL XL LVL3 (GOWN DISPOSABLE) ×1
NS IRRIG 1000ML POUR BTL (IV SOLUTION) ×2 IMPLANT
PACK C SECTION WH (CUSTOM PROCEDURE TRAY) ×2 IMPLANT
PAD OB MATERNITY 4.3X12.25 (PERSONAL CARE ITEMS) ×2 IMPLANT
PAD PREP 24X48 CUFFED NSTRL (MISCELLANEOUS) ×2 IMPLANT
SUT CHROMIC 1 CTX 36 (SUTURE) ×2 IMPLANT
SUT MON AB 4-0 PS1 27 (SUTURE) ×2 IMPLANT
SUT MON AB-0 CT1 36 (SUTURE) ×6 IMPLANT
SUT PLAIN 2 0 (SUTURE) ×1
SUT PLAIN ABS 2-0 CT1 27XMFL (SUTURE) ×1 IMPLANT
SUT VIC AB 0 CT1 36 (SUTURE) ×4 IMPLANT
SUT VIC AB 3-0 CT1 27 (SUTURE) ×1
SUT VIC AB 3-0 CT1 TAPERPNT 27 (SUTURE) ×1 IMPLANT

## 2016-09-19 NOTE — Anesthesia Preprocedure Evaluation (Addendum)
Anesthesia Evaluation  Patient identified by MRN, date of birth, ID band Patient awake    Reviewed: Allergy & Precautions, NPO status , Patient's Chart, lab work & pertinent test results  Airway Mallampati: II  TM Distance: >3 FB Neck ROM: Full    Dental  (+) Teeth Intact, Dental Advisory Given   Pulmonary asthma , former smoker,    breath sounds clear to auscultation       Cardiovascular negative cardio ROS   Rhythm:Regular Rate:Normal     Neuro/Psych  Headaches, PSYCHIATRIC DISORDERS Anxiety Depression    GI/Hepatic Neg liver ROS, GERD  Medicated and Controlled,  Endo/Other  diabetes, Well Controlled, GestationalMorbid obesity  Renal/GU negative Renal ROS  negative genitourinary   Musculoskeletal negative musculoskeletal ROS (+)   Abdominal (+) + obese,   Peds negative pediatric ROS (+)  Hematology  (+) anemia ,   Anesthesia Other Findings   Reproductive/Obstetrics (+) Pregnancy                            Lab Results  Component Value Date   WBC 10.4 09/19/2016   HGB 10.4 (L) 09/19/2016   HCT 32.0 (L) 09/19/2016   MCV 75.5 (L) 09/19/2016   PLT 267 09/19/2016   Lab Results  Component Value Date   CREATININE 0.59 01/22/2016   BUN 6 01/22/2016   NA 135 01/22/2016   K 3.8 01/22/2016   CL 107 01/22/2016   CO2 22 01/22/2016   No results found for: INR, PROTIME   Anesthesia Physical Anesthesia Plan  ASA: III and emergent  Anesthesia Plan: Epidural   Post-op Pain Management:    Induction:   Airway Management Planned: Natural Airway  Additional Equipment:   Intra-op Plan:   Post-operative Plan:   Informed Consent: I have reviewed the patients History and Physical, chart, labs and discussed the procedure including the risks, benefits and alternatives for the proposed anesthesia with the patient or authorized representative who has indicated his/her understanding and  acceptance.     Plan Discussed with: Anesthesiologist, CRNA and Surgeon  Anesthesia Plan Comments: (C/Section for arrest of dilation. Will use epidural catheter.)       Anesthesia Quick Evaluation

## 2016-09-19 NOTE — Progress Notes (Signed)
Patient ID: Vanessa Foster, female   DOB: 15-Jul-1986, 30 y.o.   MRN: [id number] Vanessa Foster is a 30 y.o. G2P1001 at 350w0d.  Subjective: Comfortable w/ epidural  Objective: BP 115/77   Pulse 99   Temp 97.9 F (36.6 C) (Oral)   Resp 18   Ht 5\' 2"  (1.575 m)   Wt 220 lb (99.8 kg)   LMP 11/20/2015 (LMP Unknown)   SpO2 98%   BMI 40.24 kg/m    FHT:  FHR: 145 bpm, variability: mod,  accelerations:  15x15,  decelerations:  occassional lates and varibales, resolved w/ position changes, fluid bolus UC:   Q 1-5 minutes, moderate-strong Dilation: 7 Effacement (%): 90 Cervical Position: Middle Station: -2 Presentation: Vertex Exam by:: Dorathy KinsmanVirginia Juliane Guest, CNM  AROM mod amount of clear fluid. IUPC, IFSE placed w/out difficulty  Labs:  Collection Time: 09/19/16 12:30 AM  Result Value Ref Range   ABO/RH(D) O POS   Glucose, capillary     Status: Abnormal   Collection Time: 09/19/16 12:31 AM  Result Value Ref Range   Glucose-Capillary 117 (H) 65 - 99 mg/dL  Glucose, capillary     Status: None   Collection Time: 09/19/16  4:34 AM  Result Value Ref Range   Glucose-Capillary 86 65 - 99 mg/dL  Glucose, capillary     Status: None   Collection Time: 09/19/16  8:23 AM  Result Value Ref Range   Glucose-Capillary 86 65 - 99 mg/dL  Glucose, capillary     Status: None   Collection Time: 09/19/16  1:07 PM  Result Value Ref Range   Glucose-Capillary 85 65 - 99 mg/dL    Assessment / Plan: 6650w0d week IUP Labor: Protracted active phase Fetal Wellbeing:  Category II, but overall reassuring fetal status Pain Control:  epidural Anticipated MOD:  SVD, but may need C/S if decels worsen or labor doesn't progress.  Titrate pitocin to achieve adequate MVU's.   [place] Eleena Grater, CNM 09/19/2016 4:44 PM

## 2016-09-19 NOTE — Transfer of Care (Signed)
Immediate Anesthesia Transfer of Care Note  Patient: Vanessa Foster  Procedure(s) Performed: Procedure(s): CESAREAN SECTION (N/A)  Patient Location: PACU  Anesthesia Type:Epidural  Level of Consciousness: awake  Airway & Oxygen Therapy: Patient Spontanous Breathing  Post-op Assessment: Report given to RN and Post -op Vital signs reviewed and stable  Post vital signs: stable  Last Vitals:  Vitals:   09/19/16 1829 09/19/16 1831  BP: 134/83 127/82  Pulse: (!) 114 (!) 107  Resp:  18  Temp:      Last Pain:  Vitals:   09/19/16 1600  TempSrc:   PainSc: 3          Complications: No apparent anesthesia complications
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
## 2016-09-19 NOTE — Progress Notes (Signed)
Patient ID: Vanessa Foster, female   DOB: May 24, 1986, 30 y.o.   MRN: [id number] Vanessa Foster is a 30 y.o. G2P1001 at 3841w0d.  Subjective: Comfortable w/ epidural.  Objective: BP 117/75   Pulse 96   Temp 98.3 F (36.8 C) (Oral)   Resp 18   Ht 5\' 2"  (1.575 m)   Wt 220 lb (99.8 kg)   LMP 11/20/2015 (LMP Unknown)   SpO2 98%   BMI 40.24 kg/m    FHT:  FHR: 145 bpm, variability: mod,  accelerations:  15x15,  decelerations:  Mild variables UC:   Q 1-5 minutes, moderate-strong Dilation: 6.5 Effacement (%): 90 Cervical Position: Middle Station: -3 Presentation: Vertex Exam by:: Vanessa Foster, CNM   Too high to AROM  Labs: Results for orders placed or performed during the hospital encounter of 09/19/16 (from the past 24 hour(s))  CBC     Status: Abnormal   Collection Time: 09/19/16 12:30 AM  Result Value Ref Range   WBC 10.4 4.0 - 10.5 K/uL   RBC 4.24 3.87 - 5.11 MIL/uL   Hemoglobin 10.4 (L) 12.0 - 15.0 g/dL   HCT 47.832.0 (L) 29.536.0 - 62.146.0 %   MCV 75.5 (L) 78.0 - 100.0 fL   MCH 24.5 (L) 26.0 - 34.0 pg   MCHC 32.5 30.0 - 36.0 g/dL   RDW 30.816.8 (H) 65.711.5 - 84.615.5 %   Platelets 267 150 - 400 K/uL  Type and screen [facility]     Status: None   Collection Time: 09/19/16 12:30 AM  Result Value Ref Range   ABO/RH(D) O POS    Antibody Screen NEG    Sample Expiration 09/22/2016   ABO/Rh     Status: None   Collection Time: 09/19/16 12:30 AM  Result Value Ref Range   ABO/RH(D) O POS   Glucose, capillary     Status: Abnormal   Collection Time: 09/19/16 12:31 AM  Result Value Ref Range   Glucose-Capillary 117 (H) 65 - 99 mg/dL  Glucose, capillary     Status: None   Collection Time: 09/19/16  4:34 AM  Result Value Ref Range   Glucose-Capillary 86 65 - 99 mg/dL  Glucose, capillary     Status: None   Collection Time: 09/19/16  8:23 AM  Result Value Ref Range   Glucose-Capillary 86 65 - 99 mg/dL    Assessment / Plan: 6541w0d week IUP Labor: active Fetal  Wellbeing:  Category I-II, overall reassuring Pain Control:  Epidural Anticipated MOD:  SVD Plan AROM and IUPC when able.  Continue increasing pitocin  Vanessa Foster, CNM 09/19/2016 11:03 AM

## 2016-09-19 NOTE — Progress Notes (Addendum)
Patient ID: Vanessa Foster, female   DOB: 1986/03/09, 30 y.o.   MRN: [id number] Vanessa Foster is a 30 y.o. G2P1001 at 624w0d.  Subjective: Comfortable w/ epidural.   Objective: BP 129/82   Pulse 79   Temp 97.9 F (36.6 C) (Oral)   Resp 18   Ht 5\' 2"  (1.575 m)   Wt 220 lb (99.8 kg)   LMP 11/20/2015 (LMP Unknown)   SpO2 98%   BMI 40.24 kg/m    FHT:  FHR: 140 bpm, variability: mod,  accelerations:  15x15,  decelerations:  Few mild variables UC:   Q 2-4 minutes, strong MVUs 225-250 Dilation: 7 Effacement (%): 90 Cervical Position: Middle Station: -2, -1 Presentation: Vertex Exam by:: Renee RivalVirgina Darrah Dredge, CNM  Very thick anterior lip  Labs: NA  Assessment / Plan: 3024w0d week IUP Labor: Protracted active phase. Significant descent since last exam, but no further dilation and now swollen anterior lip. Fetal Wellbeing:  Category I Pain Control:  Epidural Anticipated MOD:  Continue Current POC x 2 hours. May need C/S if no further progress.   [place] Pamila Mendibles, CNM 09/19/2016 6:20 PM

## 2016-09-19 NOTE — Anesthesia Pain Management Evaluation Note (Signed)
  CRNA Pain Management Visit Note  Patient: Vanessa Foster, 30 y.o., female  "Hello [facility] am a [facility]. We have an anesthesia team available at all times to provide care throughout the hospital, including epidural management and anesthesia for C-section. I don't know your plan for the delivery whether it a natural birth, water birth, IV sedation, nitrous supplementation, doula or epidural, but we want to meet your pain goals."   1.Was your pain managed to your expectations on prior hospitalizations?   Yes   2.What is your expectation for pain management during this hospitalization?     Epidural  3.How can we help you reach that goal?   Record the patient's initial score and the patient's pain goal.   Pain: 3  Pain Goal: 6 [facility] wants you to be able to say your pain was always managed very well.  Laban EmperorMalinova,Lataisha Colan Hristova 09/19/2016

## 2016-09-19 NOTE — Progress Notes (Signed)
Patient ID: Vanessa Foster, female   DOB: 14-Jan-1986, 30 y.o.   MRN: [id number] [facility]. Pit turned off. Baby recovered quickly. Dr. Vergie LivingPickens [facility]. Pt gives consent to proceed.   [name], [place] 09/19/2016 6:23 PM

## 2016-09-19 NOTE — Anesthesia Procedure Notes (Signed)
Epidural Patient location during procedure: OB Start time: 09/19/2016 6:59 AM End time: 09/19/2016 7:05 AM  Staffing Anesthesiologist: Shona SimpsonHOLLIS, Kyrra Prada D Performed: anesthesiologist   Preanesthetic Checklist Completed: patient identified, site marked, surgical consent, pre-op evaluation, timeout performed, IV checked, risks and benefits discussed and monitors and equipment checked  Epidural Patient position: sitting Prep: ChloraPrep Patient monitoring: heart rate, continuous pulse ox and blood pressure Approach: midline Location: L3-L4 Injection technique: LOR saline  Needle:  Needle type: Tuohy  Needle gauge: 17 G Needle length: 9 cm Catheter type: closed end flexible Catheter size: 20 Guage Test dose: negative and 1.5% lidocaine  Assessment Events: blood not aspirated, injection not painful, no injection resistance and no paresthesia  Additional Notes LOR @ 6  Patient identified. Risks/Benefits/Options discussed with patient including but not limited to bleeding, infection, nerve damage, paralysis, failed block, incomplete pain control, headache, blood pressure changes, nausea, vomiting, reactions to medications, itching and postpartum back pain. Confirmed with bedside nurse the patient's most recent platelet count. Confirmed with patient that they are not currently taking any anticoagulation, have any bleeding history or any family history of bleeding disorders. Patient expressed understanding and wished to proceed. All questions were answered. Sterile technique was used throughout the entire procedure. Please see nursing notes for vital signs. Test dose was given through epidural catheter and negative prior to continuing to dose epidural or start infusion. Warning signs of high block given to the patient including shortness of breath, tingling/numbness in hands, complete motor block, or any concerning symptoms with instructions to call for help. Patient was given instructions on  fall risk and not to get out of bed. All questions and concerns addressed with instructions to call with any issues or inadequate analgesia.    Reason for block:procedure for pain

## 2016-09-19 NOTE — Consult Note (Signed)
Neonatology Note:   Attendance at C-section:    I was asked by Dr. Vergie LivingPickens to attend this repeat C/S at term for arrest of dilation. The mother is a 30 y.o. female G2P1001 with IUP at 7432w0d presenting for IOL, GBS neg with good prenatal care though complicated by GDM-noncompliant, TOLAC - now fetal intolerance. ROM 6 hours before delivery, fluid clear. Infant vigorous with good spontaneous cry and tone. Needed only minimal bulb suctioning. Ap 8/9. Lungs clear to ausc in DR. To CN to care of Pediatrician.  Dineen Kidavid C. Leary RocaEhrmann, MD

## 2016-09-19 NOTE — Anesthesia Postprocedure Evaluation (Signed)
Anesthesia Post Note  Patient: Vanessa Foster  Procedure(s) Performed: Procedure(s) (LRB): CESAREAN SECTION (N/A)  Patient location during evaluation: PACU Anesthesia Type: Epidural Level of consciousness: awake and alert and oriented Pain management: pain level controlled Vital Signs Assessment: post-procedure vital signs reviewed and stable Respiratory status: spontaneous breathing, nonlabored ventilation and respiratory function stable Cardiovascular status: blood pressure returned to baseline and stable Postop Assessment: no headache, no backache, epidural receding, patient able to bend at knees and no signs of nausea or vomiting Anesthetic complications: no        Last Vitals:  Vitals:   09/19/16 1831 09/19/16 2028  BP: 127/82   Pulse: (!) 107   Resp: 18   Temp:  36.8 C    Last Pain:  Vitals:   09/19/16 2028  TempSrc: Oral  PainSc:    Pain Goal:                 Vanessa Foster A.

## 2016-09-19 NOTE — Op Note (Signed)
Operative Note   SURGERY DATE: 09/19/2016  PRE-OP DIAGNOSIS:  *Pregnancy @ 39/0 *Failed induction of labor for poorly compliant GDM *Arrest of dilation at 7cm  POST-OP DIAGNOSIS: Same. Delivered   PROCEDURE: Repeat low transverse cesarean section via pfannenstiel skin incision with double layer uterine closure  SURGEON: Surgeon(s) and Role:    * [place] Bingharlie Markevius Trombetta, MD - Primary  ASSISTANT: None  ANESTHESIA: epidural  ESTIMATED BLOOD LOSS: 900mL  DRAINS: 230mL UOP via indwelling foley  TOTAL IV FLUIDS: 2300mL crystalloid  VTE PROPHYLAXIS: SCDs to bilateral lower extremities  ANTIBIOTICS: Two grams of Cefazolin were given., within 1 hour of skin incision  SPECIMENS: placenta to pathology  COMPLICATIONS: none  FINDINGS: No intra-abdominal adhesions were noted. Grossly normal uterus, tubes and ovaries. clear amniotic fluid, cephalic female infant, weight 3200gm, APGARs 8/9, intact placenta.  PROCEDURE IN DETAIL: The patient was taken to the operating room where anesthesia was administered and normal fetal heart tones were confirmed. She was then prepped and draped in the normal fashion in the dorsal supine position with a leftward tilt.  After a time out was performed, a pfannensteil skin incision was made with the scalpel and carried through to the underlying layer of fascia. The fascia was then incised at the midline and this incision was extended laterally with the mayo scissors. Attention was turned to the superior aspect of the fascial incision which was grasped with the kocher clamps x 2, tented up and the rectus muscles were dissected off with the scalpel. In a similar fashion the inferior aspect of the fascial incision was grasped with the kocher clamps, tented up and the rectus muscles dissected off with the mayo scissors. The rectus muscles were then separated in the midline and the peritoneum was entered bluntly. The bladder blade was inserted and the vesicouterine peritoneum  was identified, tented up and entered with the metzenbaum scissors. This incision was extended laterally and the bladder flap was created digitally. The bladder blade was reinserted.  A low transverse hysterotomy was made with the scalpel until the endometrial cavity was breached yielding clear amniotic fluid. This incision was extended bluntly and the infant's head, shoulders and body were delivered atraumatically after reduction of loose nuchal cord x 2.The cord was clamped x 2 and cut, and the infant was handed to the awaiting pediatricians, after delayed cord clamping was done.  The placenta was then gradually expressed from the uterus and then the uterus was exteriorized and cleared of all clots and debris. The hysterotomy was repaired with a running suture of 1-0 monocyl. A second imbricating layer of 1-0 chromic suture was then placed. Several figure-of-eight sutures of 1-0 monocryl were added to achieve excellent hemostasis.   The uterus and adnexa were then returned to the abdomen, and the hysterotomy and all operative sites were reinspected and excellent hemostasis was noted after irrigation and suction of the abdomen with warm saline.  The fascia was reapproximated with 0 Vicryl in a simple running fashion bilaterally. The subcutaneous layer was then reapproximated with interrupted sutures of 2-0 plain gut, and the skin was then closed with 4-0 monocryl, in a subcuticular fashion.  A PICO device was applied given history of skin separation in prior c-section  The patient  tolerated the procedure well. Sponge, lap, needle, and instrument counts were correct x 2. The patient was transferred to the recovery room awake, alert and breathing independently in stable condition.  Cornelia Copaharlie Rhylie Stehr, Jr. MD Attending Center for [facility](Faculty Practice)

## 2016-09-19 NOTE — Progress Notes (Signed)
PACU RN Nolon Rodamela Madasyn Heath as of 2040 12/30.

## 2016-09-19 NOTE — Progress Notes (Addendum)
OB Note Cervix at 7cm since approximately 11am today with good UC pattern on pitocin. Given lack of cervical change, recommend repeat c-section for arrest of dilation, which patient is amenable to. Fetal decel with tachy systole but category I after d/c of pitocin and terbutaline x 1. Can proceed to the OR when staff is ready  Results for Arnoldo MoraleORAIN, Vanessa Foster (MRN [id number]) as of 09/19/2016 18:20  Ref. Range 09/19/2016 00:31 09/19/2016 04:34 09/19/2016 08:23 09/19/2016 13:07 09/19/2016 17:13  Glucose-Capillary Latest Ref Range: 65 - 99 mg/dL 409117 (H) 86 86 85 75    Cornelia Copaharlie Clessie Karras, Jr MD Attending Center for [facility] (Faculty Practice) 09/19/2016 Time: [id number]

## 2016-09-19 NOTE — Progress Notes (Signed)
Patient ID: Laure Kidneyeashelle Tre Torain, female   DOB: [birth year], 30 y.o.   MRN: [id number]  S: Patient seen & examined for progress of labor. Patient is feeling uncomfortable with contractions and would like an epidural.    O:  Vitals:   09/19/16 0027 09/19/16 0040 09/19/16 0432  BP: 132/81  101/88  Pulse: (!) 129  83  Resp: 18  18  Temp: 98.4 F (36.9 C)  98.2 F (36.8 C)  TempSrc: Oral  Oral  Weight:  220 lb (99.8 kg)   Height:  5\' 2"  (1.575 m)     Dilation: 6 Effacement (%): 70 Cervical Position: Middle Station: -3 Presentation: Vertex Exam by:: Dr Omer JackMumaw   FHT: 140 bpm, mod var, +accels, no decels TOCO: q2-424min  Foley bulb came out at about 3am.   A/P: Start pitocin, low dose Epidural Will AROM after epidural Continue expectant management Anticipate SVD

## 2016-09-20 ENCOUNTER — Encounter (HOSPITAL_COMMUNITY): Payer: Self-pay | Admitting: Obstetrics and Gynecology

## 2016-09-20 LAB — GLUCOSE, CAPILLARY
Glucose-Capillary: 101 mg/dL — ABNORMAL HIGH (ref 65–99)
Glucose-Capillary: 109 mg/dL — ABNORMAL HIGH (ref 65–99)
Glucose-Capillary: 113 mg/dL — ABNORMAL HIGH (ref 65–99)
Glucose-Capillary: 131 mg/dL — ABNORMAL HIGH (ref 65–99)
Glucose-Capillary: 111 mg/dL — ABNORMAL HIGH (ref 65–99)

## 2016-09-20 LAB — CBC
HCT: 26 % — ABNORMAL LOW (ref 36.0–46.0)
Hemoglobin: 8.6 g/dL — ABNORMAL LOW (ref 12.0–15.0)
MCH: 24.8 pg — ABNORMAL LOW (ref 26.0–34.0)
MCHC: 33.1 g/dL (ref 30.0–36.0)
MCV: 74.9 fL — ABNORMAL LOW (ref 78.0–100.0)
Platelets: 197 10*3/uL (ref 150–400)
RBC: 3.47 MIL/uL — ABNORMAL LOW (ref 3.87–5.11)
RDW: 17.1 % — ABNORMAL HIGH (ref 11.5–15.5)
WBC: 15.5 10*3/uL — ABNORMAL HIGH (ref 4.0–10.5)

## 2016-09-20 NOTE — Progress Notes (Signed)
At shift change both Rns spoke to mom regarding ambulating in the hallways and educated mom regarding risk for clots.

## 2016-09-20 NOTE — Lactation Note (Signed)
This note was copied from a baby's chart. Lactation Consultation Note  Baby 16 hours old and on double phototherapy.  Mother hx GDM. Reviewed hand expression.  R side glistening expressed.  L side drops expressed. Encouraged mother to keep practicing before latching. Baby latched for approx 10 min in football hold on R side. Mother states he recently bf for 15 mon on L side. Mother pumped at approx 1030.  Suggest she pump again by 1330 and q 3 hours after that. Discussed how breastmilk comes to volume and importance of feeding often with jaundiced baby and stimulating breasts due to sleepiness. Reviewed cluster feeding and undressing baby for feedings. Mom encouraged to feed baby 8-12 times/24 hours and with feeding cues with depth. Mom made aware of O/P services, breastfeeding support groups, community resources, and our phone # for post-discharge questions.    Patient Name: Boy Arnoldo MoraleReashelle Torain ZOXWR'UToday's Date: 09/20/2016 Reason for consult: Initial assessment   Maternal Data    Feeding Feeding Type: Breast Fed Length of feed: 10 min  LATCH Score/Interventions Latch: Repeated attempts needed to sustain latch, nipple held in mouth throughout feeding, stimulation needed to elicit sucking reflex. Intervention(s): Skin to skin;Teach feeding cues;Waking techniques Intervention(s): Adjust position;Assist with latch  Audible Swallowing: A few with stimulation Intervention(s): Skin to skin;Hand expression Intervention(s): Skin to skin;Hand expression  Type of Nipple: Everted at rest and after stimulation  Comfort (Breast/Nipple): Soft / non-tender     Hold (Positioning): Assistance needed to correctly position infant at breast and maintain latch. Intervention(s): Breastfeeding basics reviewed;Support Pillows;Skin to skin  LATCH Score: 7  Lactation Tools Discussed/Used Tools: Pump Breast pump type: Double-Electric Breast Pump (mother set up with DEBP and is pumping  )   Consult Status Consult Status: Follow-up Date: 09/21/16 Follow-up type: In-patient    Dahlia ByesBerkelhammer, [facility] 09/20/2016, 11:25 AM

## 2016-09-20 NOTE — Addendum Note (Signed)
Addendum  created 09/20/16 0745 by Junious SilkMelinda Jaryiah Mehlman, CRNA   Charge Capture section accepted, Sign clinical note

## 2016-09-20 NOTE — Progress Notes (Signed)
Rn encouraged mom to ambulate in the hallways.

## 2016-09-20 NOTE — Progress Notes (Signed)
   09/20/16 1600  Peripheral Vascular  LLE Homans' Sign Negative  RLE  Denna HaggardHomans' Sign Positive  Edema  RLE Edema Non-pitting  LLE Edema Non-Pitting   Pt states that both of her lower legs feel achy. Non-pitting edema present to bilateral lower extremities. Pt reports pain with homans sign on right leg. No redness noted, normal skin temperature, no tenderness to touch. Pt reports that it feels like a muscle ache. These findings reported to Zerita Boersarlene Lawson, CNM, no new orders, will cont. to monitor.

## 2016-09-20 NOTE — Progress Notes (Signed)
Subjective: Postpartum Day 1: Cesarean Delivery Patient reports incisional pain and tolerating PO.    Objective: Vital signs in last 24 hours: Temp:  [97.9 F (36.6 C)-99.5 F (37.5 C)] 98.9 F (37.2 C) (12/31 0452) Pulse Rate:  [70-140] 140 (12/31 0504) Resp:  [14-20] 20 (12/31 0504) BP: (106-144)/(58-105) 110/64 (12/31 0504) SpO2:  [95 %-100 %] 95 % (12/31 0452)  Physical Exam:  General: alert, cooperative, appears stated age and no distress Lochia: appropriate Uterine Fundus: firm Incision: no significant drainage, no dehiscence, no significant erythema DVT Evaluation: No evidence of DVT seen on physical exam.   Recent Labs  09/19/16 0030 09/20/16 0551  HGB 10.4* 8.6*  HCT 32.0* 26.0*    Assessment/Plan: Status post Cesarean section. Doing well postoperatively.  Continue current care.  Wyvonnia DuskyMarie Lawson 09/20/2016, 8:52 AM

## 2016-09-20 NOTE — Anesthesia Postprocedure Evaluation (Signed)
Anesthesia Post Note  Patient: Vanessa Foster  Procedure(s) Performed: Procedure(s) (LRB): CESAREAN SECTION (N/A)  Patient location during evaluation: Mother Baby Anesthesia Type: Epidural Level of consciousness: awake and alert Pain management: pain level controlled Vital Signs Assessment: post-procedure vital signs reviewed and stable Respiratory status: spontaneous breathing, nonlabored ventilation and respiratory function stable Cardiovascular status: stable Postop Assessment: no headache, no backache and epidural receding Anesthetic complications: no        Last Vitals:  Vitals:   09/20/16 0502 09/20/16 0504  BP: (!) 106/58 110/64  Pulse: (!) 104 (!) 140  Resp: 20 20  Temp:      Last Pain:  Vitals:   09/20/16 0515  TempSrc:   PainSc: 6    Pain Goal:                 Junious SilkGILBERT,Jniyah Dantuono

## 2016-09-20 NOTE — Clinical Social Work Maternal (Signed)
CLINICAL SOCIAL WORK MATERNAL/CHILD NOTE  Patient Details  Name: Vanessa Foster MRN: [id number] Date of Birth: 09/29/1985  Date:  09/20/2016  Clinical Social Worker Initiating Note:  Duong Haydel, MSW, LCSW-A   Date/ Time Initiated:  09/20/16/1136              Child's Name:  Vanessa Foster    Legal Guardian:  Other (Comment) (Not estalished by court system; MOB and FOB parent collectively )   Need for Interpreter:  None   Date of Referral:  09/19/16     Reason for Referral:  Other (Comment) (MOB hx of depression )   Referral Source:  [facility]   Address:  [address] [place], [place] [address]   Phone number:  [phone]   Household Members: Self   Natural Supports (not living in the home): Immediate Family, Parent, Extended Family, Friends   Professional Supports:Therapist (See's therapist at [facility] clinic )   Employment:Full-time   Type of Work:     Education:      Financial Resources:Private Insurance (Medicaid and [employer] )   Other Resources:     Cultural/Religious Considerations Which May Impact Care:None reported.  Strengths: Ability to meet basic needs , Compliance with medical plan , Home prepared for child    Risk Factors/Current Problems: Other (Comment) (MOB hx of depression )   Cognitive State: Alert , Able to Concentrate , Goal Oriented , Insightful    Mood/Affect: Calm , Comfortable , Interested , Relaxed    CSW Assessment:None reported. CSW met with MOB at bedside to complete assessment for consult regarding MOB's hx of depression. Upon this writer's arrival, MOB was in bed breast feeding baby while several other visitors were asleep in the room. With MOB's permission, this writer explained role and reasoning for visit being to assess her depression since babys arrival. MOB notes she has been experiencing depression and she see's a therapist at the women's clinic. She further notes she  has prescription for an anti-depressant; however, she is not interested in taking medication for it. This writer educated MOB on PPD and safe sleeping/SIDS. MOB verbalized understanding. When assessing further feelings of being prepared to go home with baby, MOB noted she feels she has everything she needs except for a pediatrician chosen. MOB notes she would like a list of pediatricians in the area. CSW provided her with one. At this time, no other needs were addressed or requested. Case closed to this CSW.   CSW Plan/Description: No Further Intervention Required/No Barriers to Discharge    Musette Kisamore, MSW, LCSW-A Clinical Social Worker  [facility]  Office: [phone]    CLINICAL SOCIAL WORK MATERNAL/CHILD NOTE  Patient Details  Name: [name] MRN: [id number] Date of Birth: 05-Aug-1986  Date:  09/20/2016  Clinical Social Worker Initiating Note:  Ferdinand Lango Tiburcio Linder, MSW, LCSW-A  Date/ Time Initiated:  09/20/16/1136     Child's Name:  Vanessa Foster    Legal Guardian:  Other (Comment) (Not estalished by court system; MOB and FOB parent collectively )   Need for Interpreter:  None   Date of Referral:  09/19/16     Reason for Referral:  Other (Comment) (MOB hx of depression )   Referral Source:  [facility]   Address:  [place], [place] [address]   Phone number:  [phone]   Household Members:  Self   Natural Supports (not living in the home):  Immediate Family, Parent, Extended Family, Friends   Professional Supports: Transport planner (See's therapist at womens clinic )   Employment: Animator   Type of Work:     Education:      Pensions consultant:  Multimedia programmer (Medicaid and Garment/textile technologist )   Other Resources:      Cultural/Religious Considerations Which May Impact Care: None reported.  Strengths:  Ability to meet basic needs , Compliance with medical plan , Home prepared for child    Risk Factors/Current Problems:  Other (Comment) (MOB hx of depression )   Cognitive State:  Alert , Able to Concentrate , Goal Oriented , Insightful    Mood/Affect:  Calm , Comfortable , Interested , Relaxed    CSW Assessment: None reported. CSW met with MOB at bedside to complete assessment for consult regarding MOB's hx of depression. Upon this writer's arrival, MOB was in bed breast feeding baby while several other visitors were asleep in the room. With MOB's permission, this writer explained role and reasoning for visit being to assess her depression since babys arrival. MOB notes she has been experiencing depression and she see's a therapist at the women's clinic. She further notes she has prescription for an  anti-depressant; however, she is not interested in taking medication for it. This Probation officer educated MOB on PPD and safe sleeping/SIDS. MOB verbalized understanding. When assessing further feelings of being prepared to go home with baby, MOB noted she feels she has everything she needs except for a pediatrician chosen. MOB notes she would like a list of pediatricians in the area. CSW provided her with one. At this time, no other needs were addressed or requested. Case closed to this CSW.   CSW Plan/Description:  No Further Intervention Required/No Barriers to Discharge    Oda Cogan, MSW, [facility]  Office: [phone]

## 2016-09-21 LAB — GLUCOSE, CAPILLARY: Glucose-Capillary: 83 mg/dL (ref 65–99)

## 2016-09-21 NOTE — Lactation Note (Signed)
This note was copied from a baby's chart. Lactation Consultation Note; Baby under phototherapy. Mom reports he is having some trouble latching baby. He is getting formula due to jaundice. Has pumped twice today- encouraged to pump q 3 hours to promote a good milk supply. Baby asleep at present. Encouraged to continue attempting to nurse and to call for assist prn. No questions at present.   Patient Name: Boy Arnoldo MoraleReashelle Torain WUJWJ'XToday's Date: 09/21/2016 Reason for consult: Follow-up assessment   Maternal Data Formula Feeding for Exclusion: No  Feeding Feeding Type: Formula Nipple Type: Slow - flow  LATCH Score/Interventions                      Lactation Tools Discussed/Used WIC Program: No   Consult Status Consult Status: Follow-up Date: 09/22/16 Follow-up type: In-patient    Pamelia HoitWeeks, Vidalia Serpas D 09/21/2016, 3:28 PM

## 2016-09-21 NOTE — Progress Notes (Signed)
Subjective: Postpartum Day 2: Cesarean Delivery Patient reports incisional pain, tolerating PO, + flatus and no problems voiding.    Objective: Vital signs in last 24 hours: Temp:  [97.8 F (36.6 C)-98.2 F (36.8 C)] 98.2 F (36.8 C) (01/01 0527) Pulse Rate:  [81-95] 91 (01/01 0527) Resp:  [18-20] 18 (01/01 0527) BP: (97-118)/(59-77) 118/72 (01/01 0527) SpO2:  [96 %-97 %] 97 % (12/31 1342)  Physical Exam:  General: alert, cooperative, appears stated age and no distress Lochia: appropriate Uterine Fundus: firm Incision: healing well, no significant drainage, no dehiscence, no significant erythema DVT Evaluation: No evidence of DVT seen on physical exam.   Recent Labs  09/19/16 0030 09/20/16 0551  HGB 10.4* 8.6*  HCT 32.0* 26.0*    Assessment/Plan: Status post Cesarean section. Doing well postoperatively.  Continue current care.  Vanessa Foster 09/21/2016, 8:21 AM

## 2016-09-22 MED ORDER — IBUPROFEN 600 MG PO TABS: 600.0000 mg | ORAL_TABLET | Freq: Four times a day (QID) | ORAL | 0 refills | Status: DC | PRN

## 2016-09-22 MED ORDER — OXYCODONE HCL 10 MG PO TABS: 10.0000 mg | ORAL_TABLET | ORAL | 0 refills | Status: DC | PRN

## 2016-09-22 MED ORDER — ACETAMINOPHEN 325 MG PO TABS
650.0000 mg | ORAL_TABLET | ORAL | 0 refills | Status: DC | PRN
Start: 2016-09-22 — End: 2017-10-06

## 2016-09-22 NOTE — Discharge Summary (Signed)
OB Discharge Summary     Patient Name: Vanessa Foster DOB: November 01, 1985 MRN: [id number]  Date of admission: 09/19/2016 Delivering MD: Clancy Bing   Date of discharge: 09/22/2016  Admitting diagnosis: induction Intrauterine pregnancy: [redacted]w[redacted]d     Secondary diagnosis:  Active Problems:   Gestational diabetes mellitus  Previous C-section  Additional problems: none     Discharge diagnosis: Term Pregnancy Delivered and GDM A2                                                                                                Post partum procedures:none  Augmentation: Pitocin and Foley Balloon  Complications: None  Hospital course:  Induction of Labor With Cesarean Section  31 y.o. yo G2P1001 at [redacted]w[redacted]d was admitted to the hospital 09/19/2016 for induction of labor. Patient was induced due to GDM. The patient went for cesarean section due to Arrest of Dilation, and delivered a Viable infant,@BABYSUPPRESS (DBLINK,ept,110,,1,,) Membrane Rupture Time/Date: )2:38 PM ,09/19/2016   @Details  of operation can be found in separate operative Note.  Patient had an uncomplicated postpartum course. She is ambulating, tolerating a regular diet, passing flatus, and urinating well.  Patient is discharged home in stable condition on 09/22/16.                                     Physical exam Vitals:   09/20/16 1835 09/21/16 0527 09/21/16 1900 09/22/16 0553  BP: 112/72 118/72 128/77 (!) 108/54  Pulse: 89 91 (!) 103 80  Resp: 20 18 18 18   Temp: 98.2 F (36.8 C) 98.2 F (36.8 C) 98.2 F (36.8 C) 98 F (36.7 C)  TempSrc: Axillary Oral Oral Oral  SpO2:   100%   Weight:      Height:       General: alert, cooperative and no distress Lochia: appropriate Uterine Fundus: firm Incision: PICO in place DVT Evaluation: No evidence of DVT seen on physical exam. Labs: Lab Results  Component Value Date   WBC 15.5 (H) 09/20/2016   HGB 8.6 (L) 09/20/2016   HCT 26.0 (L) 09/20/2016   MCV 74.9 (L)  09/20/2016   PLT 197 09/20/2016   CMP Latest Ref Rng & Units 06/26/2016  Glucose 65 - 104 mg/dL 98  BUN 6 - 20 mg/dL -  Creatinine 4.09 - 8.11 mg/dL -  Sodium 914 - 782 mmol/L -  Potassium 3.5 - 5.1 mmol/L -  Chloride 101 - 111 mmol/L -  CO2 22 - 32 mmol/L -  Calcium 8.9 - 10.3 mg/dL -  Total Protein 6.5 - 8.1 g/dL -  Total Bilirubin 0.3 - 1.2 mg/dL -  Alkaline Phos 38 - 956 U/L -  AST 15 - 41 U/L -  ALT 14 - 54 U/L -    Discharge instruction: per After Visit Summary and "Baby and Me Booklet".  After visit meds:  Allergies as of 09/22/2016   No Known Allergies     Medication List    TAKE these medications   acetaminophen 325 MG tablet  Commonly known as:  TYLENOL Take 2 tablets (650 mg total) by mouth every 4 (four) hours as needed (for pain scale < 4). What changed:  how much to take  when to take this  reasons to take this   albuterol 108 (90 Base) MCG/ACT inhaler Commonly known as:  PROVENTIL HFA;VENTOLIN HFA Inhale 2 puffs into the lungs every 6 (six) hours as needed for wheezing or shortness of breath.   ibuprofen 600 MG tablet Commonly known as:  ADVIL,MOTRIN Take 1 tablet (600 mg total) by mouth every 6 (six) hours as needed for mild pain.   Oxycodone HCl 10 MG Tabs Take 1 tablet (10 mg total) by mouth every 4 (four) hours as needed (pain scale > 7).       Diet: carb modified diet  Activity: Advance as tolerated. Pelvic rest for 6 weeks.   Outpatient follow up:1 wk for wound check Follow up Appt:Future Appointments Date Time Provider Department Center  [facility]2016 [facility]  10/26/2016 10:20 AM Marylene LandKathryn Lorraine Kooistra, CNM [facility]   Follow up Visit:No Follow-up on file.  Postpartum contraception: Nexplanon  Newborn Data: Live born female  Birth Weight: 7 lb 0.9 oz (3200 g) APGAR: 8, 9  Baby Feeding: Breast Disposition:home with mother   09/22/2016 Ernestina PennaNicholas Schenk, MD

## 2016-09-22 NOTE — Discharge Instructions (Signed)

## 2016-09-22 NOTE — Lactation Note (Signed)
This note was copied from a baby's chart. Lactation Consultation Note  Patient Name: Boy Arnoldo MoraleReashelle Torain RUEAV'WToday's Date: 09/22/2016 Reason for consult: Follow-up assessment;Hyperbilirubinemia (3% weight loss, baby off Double Photo, repeat at 1400 )  Baby is 65 hours old and was to the breast last at 1 am for 12 mins, and has been supplemented x 4 since with volumes  28,45,50,and 35 ml ( last at 1000 am ). Per mom I still want to breast feed, and when I put him to the breast the baby feed for very  Long. LC reviewed supply and demand, and also mentioned baby's are very smart and they get use to the quick flow easily. LC recommended when baby is due for a feeding , have him STS, give him and appetizer 10 -15 ml and then latch, post pump  Both breast together for 15 - 20 mins, save milk for next feeding. LC explained to mom it is a slow process for the milk to come in,  With baby consistently latching and pumping it will come in. Sore nipple and engorgement prevention and tx reviewed.  Per mom has a DEBP at home.    Maternal Data    Feeding Feeding Type: Formula Nipple Type: Slow - flow  LATCH Score/Interventions                Intervention(s): Breastfeeding basics reviewed     Lactation Tools Discussed/Used Breast pump type: Double-Electric Breast Pump (per mom hasn't pumped since yesterday )   Consult Status Consult Status: Follow-up Date: 09/22/16 Follow-up type: In-patient    Matilde SprangMargaret Ann Suann Klier 09/22/2016, 1:44 PM

## 2016-09-22 NOTE — Progress Notes (Signed)
Patient discharged to discharge lounge by RN.  Newborn and sign. Other and nurse tech accompanied patient.  Patient in stable condition, discharge instructions confirmed, pt. Verbalized understanding.  Ambulatory.

## 2016-09-22 NOTE — Lactation Note (Signed)
This note was copied from a baby's chart. Lactation Consultation Note  Patient Name: Boy Arnoldo MoraleReashelle Torain ZOXWR'UToday's Date: 09/22/2016 Reason for consult: Follow-up assessment Baby is 67 hours now, 2nd LC visit around a feeding time.  LC went back around feeding time , it was 4 hours and the baby sound asleep.  Mom had just pumped both breast for 15 mins with 6 ml EBM.  LC checked diaper and it was dry. LC placed baby STS to the left breast in football position.  Reverse pressure to counteract the edema and latched the baby with depth. Baby sluggish with  Feeding pattern until 5 F feeding tube with 6 ml of EBM was added in the corner of the month approx 1- 1/2  Inches. Baby still sluggish at  Feeding and LC had to push the syringe slowly ( pace feed ) and the baby's feeding  Pattern picked up. Baby fed about 20 mins, depth obtained, and started to be non - nutritive so LC instructed mom  In how to release baby from the breast, nipple well rounded. Baby settled, and when LC still in the room , baby started  Rooting. Unable to latch the baby on the right breast due to being over full , areola non - compressible and tender.  LC mentioned to mom the right breast needs to be iced for 15 - 20 mins and then she could pump both breast for 10 -15 mins.  Since baby still hungry , LC fixed mom a bottle of 24 ml of formula to satisfy baby's hunger needs.  And mom aware of how to pace feed baby. LC stressed to mom the importance of tickling baby's upper lip and wait for hime to open  Wide, so baby will be able to transition to the breast.  MBU RN Phillips HayJessica Reynolds aware of the [facility] and the need for her to have her breast iced for 15 - 20 mins , and pump .    Maternal Data Has patient been taught Hand Expression?: Yes Does the patient have breastfeeding experience prior to this delivery?: Yes  Feeding Feeding Type: Bottle Fed - Formula Nipple Type: Slow - flow Length of feed: 20 min  (added the SNS , due to sluggish pattern )  LATCH Score/Interventions Latch: Grasps breast easily, tongue down, lips flanged, rhythmical sucking. Intervention(s): Skin to skin;Teach feeding cues;Waking techniques Intervention(s): Adjust position;Assist with latch;Breast massage;Breast compression  Audible Swallowing: A few with stimulation (increased with SNS )  Type of Nipple: Everted at rest and after stimulation (areola edema )  Comfort (Breast/Nipple): Engorged, cracked, bleeding, large blisters, severe discomfort (right breast , MBU RN aware mom needs ice for 15 - 20 mins and pump )  Problem noted: Filling Interventions (Filling): Massage;Reverse pressure  Hold (Positioning): Assistance needed to correctly position infant at breast and maintain latch. Intervention(s): Breastfeeding basics reviewed  LATCH Score: 7  Lactation Tools Discussed/Used Tools: Pump;76F feeding tube / Syringe Breast pump type: Double-Electric Breast Pump   Consult Status Consult Status: Follow-up Date: 09/22/16 Follow-up type: In-patient    Matilde SprangMargaret Ann Harace Mccluney 09/22/2016, 3:17 PM

## 2016-09-23 ENCOUNTER — Encounter: Payer: Self-pay | Admitting: Obstetrics and Gynecology

## 2016-09-28 ENCOUNTER — Ambulatory Visit: Payer: Managed Care, Other (non HMO) | Admitting: *Deleted

## 2016-09-28 DIAGNOSIS — Z4889 Encounter for other specified surgical aftercare: Secondary | ICD-10-CM

## 2016-09-28 NOTE — Progress Notes (Signed)
Patient presents to clinic for wound check, 5d post c/s. Patient has pico dressing in place, scant bloody drainage noted. Removed dressing. Incision is well approximated, no erythema, drainage. Cleansed with ns and 4x4 guaze, patted dry. Instructed patient to wash incision daily with soap and warm water, dry thoroughly. Gave patient some 4X4 gauze and told her to keep a thin layer of gauze on incision to absorb moisture, d/t fold of skin over incision. Patient voiced understanding.

## 2016-09-29 ENCOUNTER — Encounter: Payer: Self-pay | Admitting: *Deleted

## 2016-09-30 ENCOUNTER — Ambulatory Visit: Payer: Self-pay

## 2016-10-05 ENCOUNTER — Telehealth: Payer: Self-pay | Admitting: *Deleted

## 2016-10-05 NOTE — Telephone Encounter (Addendum)
Received message on 1/11 from Suszanne FinchShonda Gainey, RN with the [employer]. She stated that she had completed home visit with Vanessa Foster and had some concerns. Vanessa Foster had Edinburgh score of 15, is not taking the prescribed Zoloft, missed appt w/Jamie and would like to reschedule.  I called pt and left message stating that I am calling regarding a message we received last week. Please call back to leave a message stating whether we may leave a detailed message on her voice mail.   1/16  0915  Called and spoke w/pt. She stated that she didn't really want to take the Zoloft but had gone to pick it up from the pharmacy and was told it had been "put back."  Pt did not request the pharmacy to fill for her at that time. She agrees to reschedule appt w/Jamie and will come any day this week including this afternoon. I advised that she will be called with appt from our scheduling staff. Pt voiced understanding and agreed.

## 2016-10-06 ENCOUNTER — Institutional Professional Consult (permissible substitution): Payer: Self-pay

## 2016-10-26 ENCOUNTER — Ambulatory Visit (INDEPENDENT_AMBULATORY_CARE_PROVIDER_SITE_OTHER): Payer: Medicaid Other | Admitting: Clinical

## 2016-10-26 ENCOUNTER — Ambulatory Visit (INDEPENDENT_AMBULATORY_CARE_PROVIDER_SITE_OTHER): Payer: Medicaid Other | Admitting: Student

## 2016-10-26 ENCOUNTER — Encounter: Payer: Self-pay | Admitting: Student

## 2016-10-26 ENCOUNTER — Encounter: Payer: Self-pay | Admitting: Family Medicine

## 2016-10-26 VITALS — Wt 201.3 lb

## 2016-10-26 DIAGNOSIS — Z3202 Encounter for pregnancy test, result negative: Secondary | ICD-10-CM | POA: Diagnosis not present

## 2016-10-26 DIAGNOSIS — F4323 Adjustment disorder with mixed anxiety and depressed mood: Secondary | ICD-10-CM | POA: Diagnosis not present

## 2016-10-26 DIAGNOSIS — Z98891 History of uterine scar from previous surgery: Secondary | ICD-10-CM

## 2016-10-26 DIAGNOSIS — F332 Major depressive disorder, recurrent severe without psychotic features: Secondary | ICD-10-CM

## 2016-10-26 DIAGNOSIS — Z3042 Encounter for surveillance of injectable contraceptive: Secondary | ICD-10-CM

## 2016-10-26 DIAGNOSIS — R4589 Other symptoms and signs involving emotional state: Secondary | ICD-10-CM

## 2016-10-26 DIAGNOSIS — F329 Major depressive disorder, single episode, unspecified: Principal | ICD-10-CM

## 2016-10-26 HISTORY — DX: History of uterine scar from previous surgery: Z98.891

## 2016-10-26 LAB — POCT PREGNANCY, URINE: Preg Test, Ur: NEGATIVE

## 2016-10-26 MED ORDER — MEDROXYPROGESTERONE ACETATE 150 MG/ML IM SUSP: 150.0000 mg | Freq: Once | INTRAMUSCULAR | Status: DC

## 2016-10-26 MED ORDER — SERTRALINE HCL 50 MG PO TABS: 50.0000 mg | ORAL_TABLET | Freq: Every day | ORAL | 2 refills | Status: DC

## 2016-10-26 MED ORDER — MEDROXYPROGESTERONE ACETATE 150 MG/ML IM SUSP
150.0000 mg | Freq: Once | INTRAMUSCULAR | Status: AC
  Administered 2016-10-26: 150 mg via INTRAMUSCULAR

## 2016-10-26 NOTE — Progress Notes (Signed)
ya

## 2016-10-26 NOTE — Patient Instructions (Addendum)
Breastfeeding and Inducing Lactation Induced lactation is using hormones or other medicines and breast stimulation to help you produce breast milk. You may want to try induced lactation if you:  Are adopting a baby.   Are having a surrogate mother carry your baby.   Have to stop breastfeeding for a period of time.  HOW DOES IT WORK? During pregnancy, your hormones change to prepare your body to produce breast milk. After pregnancy, hormones signal your body to start making breast milk to feed your baby. When you do not go through these changes, it may be hard for you to produce enough breast milk to feed your baby. Your health care provider and a lactation consultant can help you produce milk using medicine and breast stimulation.  WILL I MAKE ENOUGH MILK TO FEED MY BABY? Induced lactation may be successful. However, very few women who use induced lactation to produce breast milk can make all the milk their baby needs. You may need to feed your baby with donated breast milk or infant formula in addition to your breast milk. This will make sure your baby gets adequate nutrition. Induced lactation is usually more successful if you have been pregnant before.  HOW DOES MY BODY PRODUCE MILK? To induce lactation, you will start taking hormones 3-4 months before you want to start breastfeeding. You will continue to take them until about 6 weeks before the baby arrives. When you stop taking the hormones, you will need to perform breast stimulation a number of times per day to encourage breast milk production. Breast stimulation can be performed by gently rubbing and stretching the nipple tissue. Breast stimulation can also be performed using a double electric hospital-grade pump to mimic a baby's suckling at the breast. Try to pump every 3 hours (8 times a day) for 20 minutes on each breast. If you are unable to pump that many times each day, do it as often as possible. This helps your body continue to make  milk. When you put your baby to your breast, your body may also respond to the smell, sound, and feel of your baby by increasing the amount of milk you produce.  Your health care provider may also prescribe medicine to stimulate lactation and increase your milk supply. Herbal medicines are also available to try to induce lactation. It is important to know that these medicines are not approved or regulated by the FDA. Always check with your health care provider before using any herbal medicine. WHAT ELSE DO I NEED TO KNOW?  You should only take hormones and medicines as directed by your health care provider or trained lactation consultant.  Talk to your health care provider or lactation consultant if you need guidance. He or she may be able to help you start a milk supply and advise you as you make important decisions about nourishing your baby.  Try using a supplemental nursing system to provide extra donated breast milk or formula at the breast while the baby nurses. This ensures that your infant gets enough nutrition while you are breastfeeding. Ask a lactation specialist to help you find and use this device. This information is not intended to replace advice given to you by your health care provider. Make sure you discuss any questions you have with your health care provider. Document Released: 09/07/2005 Document Revised: 09/12/2013 Document Reviewed: 06/30/2013 Elsevier Interactive Patient Education  2017 [employer] Call Linnell FullingLinda Coppola at [facility] to talk about reestablishing lactation.  [phone]

## 2016-10-26 NOTE — Addendum Note (Signed)
Addended by: Faythe CasaBELLAMY, JEANETTA M on: 10/26/2016 12:00 PM   Modules accepted: Orders

## 2016-10-26 NOTE — BH Specialist Note (Signed)
Session Start time: 11:00  End Time: 11:30 Total Time:  30 minutes Type of Service: Behavioral Health - Individual/Family Interpreter: No.   Interpreter Name & Language: n/a # [facility]-June 2018: [facility]  SUBJECTIVE: Vanessa Foster is a 31 y.o. female  Pt. was referred by F/u for:  depression. Pt. reports the following symptoms/concerns: Pt states that she is primarily concerned about birth not going as planned(c-section), having to give up breastfeeding too soon, worry that her husband may be depressed, and having allowing others to watch baby so she can sleep. Duration of problem:  One month(most recent) Severity: severe Previous treatment: none  OBJECTIVE: Mood: Appropriate & Affect: Appropriate Risk of harm to self or others: No known risk of harm to self or others Assessments administered: PHQ9: 17/ GAD7: 17  LIFE CONTEXT:  Family & Social: Lives with husband and 9yo son; family and friends supportive School/ Work: On maternity leave, will go back to work in two weeks  Self-Care: No walking, forgets to eat, anxiety affecting sleep Life changes: Current childbirth What is important to pt/family (values): Healthy baby, getting back to work as self-care   GOALS ADDRESSED:  -Reduce symptoms of anxiety and depression  INTERVENTIONS: Motivational Interviewing   ASSESSMENT:  Pt currently experiencing Major depressive disorder, recurrent, moderate.  Pt may benefit from brief therapeutic intervention regarding continued coping with symptoms of depression, along with referral to community behavioral health.   PLAN: 1. F/U with behavioral health clinician: Two weeks 2. Behavioral Health meds: Zoloft, begin today 3. Behavioral recommendations:  -Pick up Zoloft at pharmacy today, and begin taking as prescribed -Take two hour nap this afternoon, while sister watches baby -[facility] by no later than 10-27-16, to set up initial appointment for  ongoing therapy and BH med management 4. Referral: Brief Counseling/Psychotherapy [facility] 5. From scale of 1-10, how likely are you to follow plan: 9  Rae LipsJamie C Mcmannes LCSWA Behavioral Health Clinician  Marlon PelWarmhandoff: no  Dep[facility] 10/26/2016 09/28/2016 09/16/2016 09/10/2016 09/02/2016  Decreased Interest 2 1 3 2 2   Down, Depressed, Hopeless 2 2 3 2 2   PHQ - 2 Score 4 3 6 4 4   Altered sleeping 3 1 3 3 3   Tired, decreased energy 3 1 3 3 3   Change in appetite 3 2 3 3 3   Feeling bad or failure about yourself  2 1 2 1 1   Trouble concentrating 2 1 2 2 1   Moving slowly or fidgety/restless 0 1 2 2  0  Suicidal thoughts 0 0 0 0 0  PHQ-9 Score 17 10 21 18 15   Some recent data might be hidden   GAD 7 : Generalized Anxiety Score 10/26/2016 09/28/2016 09/16/2016 09/10/2016  Nervous, Anxious, on Edge 2 2 3 3   Control/stop worrying 2 2 3 3   Worry too much - different things 3 2 3 2   Trouble relaxing 3 2 3 2   Restless 2 1 3 2   Easily annoyed or irritable 3 0 3 3  Afraid - awful might happen 2 0 2 2  Total GAD 7 Score 17 9 20 17       Jamie C Mcmannes LCSWA

## 2016-10-26 NOTE — Progress Notes (Addendum)
Subjective:     Vanessa Foster is a 31 y.o. female who presents for a postpartum visit. She is 6 weeks postpartum following a low cervical transverse Cesarean section. I have fully reviewed the prenatal and intrapartum course. The delivery was at 39  gestational weeks. Outcome: repeat cesarean section, low transverse incision. Anesthesia: epidural. Postpartum course has been uneventful. Baby's course has been uneventful. Baby is feeding by bottle - Similac with Soy. Bleeding no bleeding. Bowel function is normal. Bladder function is normal. Patient is sexually active. Contraception method is Depo-Provera injections. Postpartum depression screening: positive.  The following portions of the patient's history were reviewed and updated as appropriate: allergies, current medications, past family history, past medical history, past social history, past surgical history and problem list.  Review of Systems Pertinent items are noted in HPI.   Objective:    Wt 201 lb 4.8 oz (91.3 kg)   Breastfeeding? No   BMI 36.82 kg/m   General:  alert, cooperative and no distress   Breasts:  inspection negative, no nipple discharge or bleeding, no masses or nodularity palpable and not examined  Lungs: clear to auscultation bilaterally  Heart:  regular rate and rhythm, S1, S2 normal, no murmur, click, rub or gallop  Abdomen: soft, non-tender; bowel sounds normal; no masses,  no organomegaly Incision well-healed, no tenderness or erythema.    Vulva:  not evaluated  Vagina: not evaluated  Cervix:  not examined  Corpus: not examined  Adnexa:  not examiend  Rectal Exam: Not performed.        Assessment:    Normal postpartum exam. Pap smear not done at today's visit. Depression score positive  Plan:    1. Contraception: Depo-Provera injections 2. Appointment with Asher MuirJamie today and will see Asher MuirJamie in two weeks when she comes back for her 2 hour gtt. Will start  50 mg now and begin care at make follow [facility]  [facility] as possible.  3. Patient wants to try to re-lactate. Directed her to lactation consultant [facility] and printed out information on establishing lactation.  3. Follow up in 3 months for depo and asap for 2 hour gtt or as needed.

## 2016-11-09 ENCOUNTER — Other Ambulatory Visit: Payer: Medicaid Other

## 2016-11-09 ENCOUNTER — Ambulatory Visit (INDEPENDENT_AMBULATORY_CARE_PROVIDER_SITE_OTHER): Payer: Medicaid Other | Admitting: Clinical

## 2016-11-09 DIAGNOSIS — O24429 Gestational diabetes mellitus in childbirth, unspecified control: Secondary | ICD-10-CM

## 2016-11-09 DIAGNOSIS — F3341 Major depressive disorder, recurrent, in partial remission: Secondary | ICD-10-CM
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
## 2016-11-10 LAB — GLUCOSE TOLERANCE, 2 HOURS
Glucose, 2 hour: 114 mg/dL (ref 65–139)
Glucose, GTT - Fasting: 96 mg/dL (ref 65–99)

## 2017-01-11 ENCOUNTER — Ambulatory Visit (INDEPENDENT_AMBULATORY_CARE_PROVIDER_SITE_OTHER): Payer: Medicaid Other | Admitting: *Deleted

## 2017-01-11 VITALS — BP 129/89 | HR 90 | Wt 192.6 lb

## 2017-01-11 DIAGNOSIS — Z3042 Encounter for surveillance of injectable contraceptive: Secondary | ICD-10-CM

## 2017-01-11 MED ORDER — MEDROXYPROGESTERONE ACETATE 150 MG/ML IM SUSP
150.0000 mg | Freq: Once | INTRAMUSCULAR | Status: AC
  Administered 2017-01-11: 150 mg via INTRAMUSCULAR

## 2017-01-11 NOTE — Progress Notes (Signed)
Pt desires to switch to Nexplanon for birth control due to constant bleeding.  She will receive Depo Provera today and return in 2 months for Nexplanon insertion.

## 2017-03-04 ENCOUNTER — Ambulatory Visit: Payer: Self-pay | Admitting: Medical

## 2017-03-05 ENCOUNTER — Ambulatory Visit: Payer: Medicaid Other | Admitting: Obstetrics & Gynecology

## 2017-04-01 ENCOUNTER — Ambulatory Visit (INDEPENDENT_AMBULATORY_CARE_PROVIDER_SITE_OTHER): Payer: Medicaid Other | Admitting: General Practice

## 2017-04-01 VITALS — BP 128/96 | HR 90 | Ht 62.0 in | Wt 199.5 lb

## 2017-04-01 DIAGNOSIS — Z3042 Encounter for surveillance of injectable contraceptive: Secondary | ICD-10-CM | POA: Diagnosis present

## 2017-04-01 MED ORDER — MEDROXYPROGESTERONE ACETATE 150 MG/ML IM SUSP
150.0000 mg | Freq: Once | INTRAMUSCULAR | Status: AC
  Administered 2017-04-01: 150 mg via INTRAMUSCULAR

## 2017-06-24 ENCOUNTER — Ambulatory Visit (INDEPENDENT_AMBULATORY_CARE_PROVIDER_SITE_OTHER): Payer: Medicaid Other | Admitting: *Deleted

## 2017-06-24 VITALS — BP 127/89 | HR 86

## 2017-06-24 DIAGNOSIS — Z3042 Encounter for surveillance of injectable contraceptive: Secondary | ICD-10-CM

## 2017-06-24 DIAGNOSIS — Z3049 Encounter for surveillance of other contraceptives: Secondary | ICD-10-CM

## 2017-06-24 MED ORDER — MEDROXYPROGESTERONE ACETATE 150 MG/ML IM SUSP
150.0000 mg | Freq: Once | INTRAMUSCULAR | Status: AC
  Administered 2017-06-24: 150 mg via INTRAMUSCULAR

## 2017-06-24 NOTE — Progress Notes (Signed)
Pt presents to clinic for depo provera injection. Tolerated well.

## 2017-09-10 ENCOUNTER — Ambulatory Visit (INDEPENDENT_AMBULATORY_CARE_PROVIDER_SITE_OTHER): Payer: Medicaid Other | Admitting: General Practice

## 2017-09-10 VITALS — BP 132/80 | HR 86 | Ht 62.0 in | Wt 211.0 lb

## 2017-09-10 DIAGNOSIS — Z309 Encounter for contraceptive management, unspecified: Secondary | ICD-10-CM | POA: Diagnosis present

## 2017-09-10 DIAGNOSIS — Z3042 Encounter for surveillance of injectable contraceptive: Secondary | ICD-10-CM | POA: Diagnosis present

## 2017-09-10 MED ORDER — MEDROXYPROGESTERONE ACETATE 150 MG/ML IM SUSP
150.0000 mg | Freq: Once | INTRAMUSCULAR | Status: AC
Start: 2017-09-10 — End: 2017-09-10
  Administered 2017-09-10: 150 mg via INTRAMUSCULAR

## 2017-09-10 NOTE — Progress Notes (Signed)
Depoprovera given

## 2017-09-10 NOTE — Progress Notes (Signed)
I have reviewed this chart and agree with the RN/CMA assessment and management.    K. Meryl Alyda Megna, M.D. Attending Obstetrician & Gynecologist, [facility], [facility]  

## 2017-09-21 HISTORY — PX: INCISE AND DRAIN ABCESS: PRO64

## 2017-10-06 ENCOUNTER — Other Ambulatory Visit: Payer: Self-pay

## 2017-10-06 ENCOUNTER — Encounter (HOSPITAL_COMMUNITY): Payer: Self-pay

## 2017-10-06 ENCOUNTER — Emergency Department (HOSPITAL_COMMUNITY)
Admission: EM | Admit: 2017-10-06 | Discharge: 2017-10-06 | Disposition: A | Discharge status: Home / Self Care | Payer: Medicaid Other | Attending: Emergency Medicine | Admitting: Emergency Medicine

## 2017-10-06 DIAGNOSIS — Z79899 Other long term (current) drug therapy: Secondary | ICD-10-CM | POA: Insufficient documentation

## 2017-10-06 DIAGNOSIS — Z87891 Personal history of nicotine dependence: Secondary | ICD-10-CM | POA: Insufficient documentation

## 2017-10-06 DIAGNOSIS — Z23 Encounter for immunization: Secondary | ICD-10-CM | POA: Insufficient documentation

## 2017-10-06 DIAGNOSIS — L02412 Cutaneous abscess of left axilla: Secondary | ICD-10-CM

## 2017-10-06 DIAGNOSIS — L02414 Cutaneous abscess of left upper limb: Secondary | ICD-10-CM | POA: Insufficient documentation

## 2017-10-06 DIAGNOSIS — J4520 Mild intermittent asthma, uncomplicated: Secondary | ICD-10-CM | POA: Insufficient documentation

## 2017-10-06 MED ORDER — DOXYCYCLINE HYCLATE 100 MG PO CAPS: 100.0000 mg | ORAL_CAPSULE | Freq: Two times a day (BID) | ORAL | 0 refills | Status: DC

## 2017-10-06 MED ORDER — NAPROXEN 500 MG PO TABS: 500.0000 mg | ORAL_TABLET | Freq: Two times a day (BID) | ORAL | 0 refills | Status: DC

## 2017-10-06 MED ORDER — LIDOCAINE-EPINEPHRINE (PF) 2 %-1:200000 IJ SOLN
10.0000 mL | Freq: Once | INTRAMUSCULAR | Status: AC
  Administered 2017-10-06: 10 mL
  Filled 2017-10-06: qty 20

## 2017-10-06 MED ORDER — TETANUS-DIPHTH-ACELL PERTUSSIS 5-2.5-18.5 LF-MCG/0.5 IM SUSP
0.5000 mL | Freq: Once | INTRAMUSCULAR | Status: AC
  Administered 2017-10-06: 0.5 mL via INTRAMUSCULAR
  Filled 2017-10-06: qty 0.5

## 2017-10-06 NOTE — ED Provider Notes (Signed)
[facility] EMERGENCY DEPARTMENT Provider Note   CSN: [id number] Arrival date & time: 10/06/17  1330     History   Chief Complaint Chief Complaint  Patient presents with  . Abscess    HPI Vanessa Foster is a 32 y.o. female who presents to the ED with complaint of multiple boils in axillary region (2 on the R and 1 on the L) that she first noted 1 week ago. States that the 2 in her right axilla have had active purulent drainage. L boil has not had any drainage. States the areas are painful, rates this a 4/10 in severity at present, worse with showers, no specific alleviating factors. She has a hx of similar on several occassions, was instructed to have sweat glands removed by dermatologist however she no longer has insurance to move forward with this procedures. Has required doxycyline several times, most recently 1 year ago. Denies fever, chills, nausea, or vomiting.   HPI  Past Medical History:  Diagnosis Date  . Abnormal Pap smear of cervix    2011?  Marland Kitchen Allergy   . Anemia   . Anxiety   . Asthma    hospitalization 2008, last attack this month  . Chronic nausea   . Depression   . Dysmenorrhea   . Dyspareunia   . Gestational diabetes 07/13/2016   no meds  . Migraines   . PID (pelvic inflammatory disease)   . Wears glasses     Patient Active Problem List   Diagnosis Date Noted  . S/P primary low transverse C-section 10/26/2016  . Normal postpartum course 10/26/2016  . Gestational diabetes mellitus 09/19/2016  . Anemia of mother in pregnancy, antepartum 07/29/2016  . Daytime somnolence 07/11/2015  . Sleep disorder 07/11/2015  . Cephalalgia 07/11/2015  . Asthma, mild intermittent 07/11/2015  . Depressed mood 07/11/2015  . Chronic nausea 07/11/2015  . Changing skin lesion 07/11/2015  . Adjustment disorder with mixed anxiety and depressed mood 07/11/2015  . Hemorrhoids 04/30/2014    Past Surgical History:  Procedure Laterality Date  .  CESAREAN SECTION    . CESAREAN SECTION N/A 09/19/2016   Procedure: CESAREAN SECTION;  Surgeon: Winchester Bing, MD;  Location: [facility] BIR[facility]ING SUIT[facility];  Service: Obstetrics;  Laterality: N/A;  . implanon     inserted 2010  . WISDOM TOOTH EXTRACTION      OB History    Gravida Para Term Preterm AB Living   2 1 1     1    SAB TAB Ectopic Multiple Live Births           1       Home Medications    Prior to Admission medications   Medication Sig Start Date End Date Taking? Authorizing Provider  acetaminophen (TYLENOL) 325 MG tablet Take 2 tablets (650 mg total) by mouth every 4 (four) hours as needed (for pain scale < 4). Patient not taking: Reported on 10/26/2016 09/22/16   Lorne Skeens, MD  albuterol (PROVENTIL HFA;VENTOLIN HFA) 108 (90 Base) MCG/ACT inhaler Inhale 2 puffs into the lungs every 6 (six) hours as needed for wheezing or shortness of breath. 06/02/16   Degele, Kandra Nicolas, MD  ibuprofen (ADVIL,MOTRIN) 600 MG tablet Take 1 tablet (600 mg total) by mouth every 6 (six) hours as needed for mild pain. Patient not taking: Reported on 10/26/2016 09/22/16   Lorne Skeens, MD  oxyCODONE 10 MG TABS Take 1 tablet (10 mg total) by mouth every 4 (four) hours as needed (  pain scale > 7). Patient not taking: Reported on 10/26/2016 09/22/16   Lorne SkeensSchenk, Nicholas Michael, MD  sertraline (ZOLOFT) 50 MG tablet Take 1 tablet (50 mg total) by mouth daily. 10/26/16 10/26/17  Marylene LandKooistra, Kathryn Lorraine, CNM    Family History Family History  Problem Relation Age of Onset  . Hypertension Mother   . Heart disease Mother        early 2540s  . Diabetes Mother   . Diabetes Paternal Grandmother   . Stroke Paternal Grandmother     Social History Social History   Tobacco Use  . Smoking status: Former Smoker    Last attempt to quit: 03/20/2008    Years since quitting: 9.5  . Smokeless tobacco: Never Used  Substance Use Topics  . Alcohol use: No    Alcohol/week: 0.0 oz  . Drug use: No      Allergies   Patient has no known allergies.   Review of Systems Review of Systems  Constitutional: Negative for chills and fever.  Skin:       Positive for multiple boils to axillary regions.   Neurological: Negative for weakness and numbness.     Physical Exam Updated Vital Signs BP (!) 127/101 (BP Location: Right Arm)   Pulse 87   Temp 98.7 F (37.1 C) (Oral)   Resp 20   Ht 5\' 2"  (1.575 m)   Wt 95.7 kg (211 lb)   SpO2 99%   BMI 38.59 kg/m   Physical Exam  Constitutional: She appears well-developed and well-nourished. No distress.  HENT:  Head: Normocephalic and atraumatic.  Eyes: Conjunctivae are normal. Right eye exhibits no discharge. Left eye exhibits no discharge.  Cardiovascular:  Pulses:      Radial pulses are 2+ on the right side, and 2+ on the left side.  Neurological: She is alert.  Clear speech. 5/5 grip strength bilaterally. Sensation grossly intact to bilateral upper extremities.   Skin:  R axilla: Diffuse induration. There is a 2cm area with purulent drainage to the anterior aspect of the axilla, there is a 1 cm healing area to the posterior aspect with some dried drainage. No surrounding erythema or warmth. No palpable area of fluctuance.   L axilla: Diffuse induration. There is a 2cm area diameter of palpable fluctuance to the inferior aspect of the axilla. No surrounding erythema or warmth.    Psychiatric: She has a normal mood and affect. Her behavior is normal. Thought content normal.  Nursing note and vitals reviewed.   ED Treatments / Results  Labs (all labs ordered are listed, but only abnormal results are displayed) Labs Reviewed - No data to display  EKG  EKG Interpretation None       Radiology No results found.  Procedures .Marland Kitchen.Incision and Drainage Date/Time: 10/06/2017 5:25 PM Performed by: Cherly AndersonPetrucelli, Advaith Lamarque R, PA-C Authorized by: Cherly AndersonPetrucelli, Terea Neubauer R, PA-C   Consent:    Consent obtained:  Verbal   Consent given  by:  Patient   Risks discussed:  Bleeding, incomplete drainage, pain, infection and damage to other organs   Alternatives discussed:  No treatment Location:    Type:  Abscess   Size:  2 cm   Location:  Upper extremity   Upper extremity location: left axillary region. Pre-procedure details:    Skin preparation:  Betadine Anesthesia (see MAR for exact dosages):    Anesthesia method:  Local infiltration   Local anesthetic:  Lidocaine 2% WITH epi Procedure type:    Complexity:  Simple Procedure details:  Incision types:  Stab incision   Scalpel blade:  11   Wound management:  Probed and deloculated and irrigated with saline   Drainage:  Bloody and purulent   Drainage amount:  Moderate   Wound treatment:  Wound left open   Packing materials:  None Post-procedure details:    Patient tolerance of procedure:  Tolerated well, no immediate complications   (including critical care time)  Medications Ordered in ED Medications  lidocaine-EPINEPHrine (XYLOCAINE W/EPI) 2 %-1:200000 (PF) injection 10 mL (not administered)   Initial Impression / Assessment and Plan / ED Course  I have reviewed the triage vital signs and the nursing notes.  Pertinent labs & imaging results that were available during my care of the patient were reviewed by me and considered in my medical decision making (see chart for details).    Patient presents with concern for boils in axillary regions bilaterally. She is nontoxic appearing, afebrile, nonseptic.  Areas in the R axillary already with active drainage. Left axillary region with skin abscess amenable to incision and drainage.  Abscess was not large enough to warrant packing or drain, encouraged home warm soaks and flushing, instructed PCP follow up for recheck in 3 days. Patient has required doxycycline several times in the past for these multiple abscess regions to improve/resolve. Will provide 7 day course of doxycycline and Naproxen for pain.. I discussed  treatment plan, need for PCP follow-up, and return precautions with the patient. Provided opportunity for questions, patient confirmed understanding and is in agreement with plan.   Final Clinical Impressions(s) / ED Diagnoses   Final diagnoses:  Abscess of axilla, left    ED Discharge Orders        Ordered    doxycycline (VIBRAMYCIN) 100 MG capsule  2 times daily     10/06/17 1657    naproxen (NAPROSYN) 500 MG tablet  2 times daily     10/06/17 1657       Beckhem Isadore R, PA-C 10/06/17 1733  Tdap updated during todays' visit given patient unable to recall most recent tetanus vaccine.    Cherly Anderson, PA-C 10/06/17 1734    Maia Plan, MD 10/07/17 [phone]

## 2017-10-06 NOTE — ED Triage Notes (Signed)
Per Pt, Pt is coming from home with complaints of three boils under her arm. Pt has hx of the same. Denies any fevers.

## 2017-10-06 NOTE — Discharge Instructions (Addendum)
You were seen in the emergency department for abscesses to your armpit region.   The abscess in your left armpit was incised and drained in the emergency department  We have prescribed you doxycycline- this is an antibiotic. Please take all of your antibiotics until finished. You may develop abdominal discomfort or diarrhea from the antibiotic.  You may help offset this with probiotics which you can buy at the store (ask your pharmacist if unable to find) or get probiotics in the form of eating yogurt. Do not eat or take the probiotics until 2 hours after your antibiotic. If you are unable to tolerate these side effects follow-up with your primary care provider or return to the emergency department.   If you begin to experience any blistering, rashes, swelling, or difficulty breathing seek medical care for evaluation of potentially more serious side effects.   Please be aware that this medication may interact with other medications you are taking, please be sure to discuss your medication list with your pharmacist. If you are taking birth control the antibiotic will deactivate your birth control for 2 weeks.   We have also prescribed you naproxen for pain- this is a nonsteroidal anti-inflammatory medicine. You may take this once in the morning and once in the evening. Take this with food as it can upset your stomach and at worst cause stomach bleeding. Do not take other NSAIDs (motrin, advil,aleve) with this medication as it will further irritate your stomach. You may supplement with Tylenol.   Follow-up with your primary care provider in the next 3 days for recheck.  Return to the emergency department for any new or worsening symptoms including but not limited to fever, chills, nausea, vomiting, redness spreading from the areas of the abscesses.  Any other concerns.

## 2017-11-26 ENCOUNTER — Ambulatory Visit: Payer: Self-pay

## 2017-12-02 ENCOUNTER — Ambulatory Visit (INDEPENDENT_AMBULATORY_CARE_PROVIDER_SITE_OTHER): Payer: Medicaid Other | Admitting: *Deleted

## 2017-12-02 VITALS — BP 107/68 | HR 89

## 2017-12-02 DIAGNOSIS — Z3049 Encounter for surveillance of other contraceptives: Secondary | ICD-10-CM

## 2017-12-02 DIAGNOSIS — Z3042 Encounter for surveillance of injectable contraceptive: Secondary | ICD-10-CM

## 2017-12-02 MED ORDER — MEDROXYPROGESTERONE ACETATE 150 MG/ML IM SUSP
150.0000 mg | Freq: Once | INTRAMUSCULAR | Status: AC
Start: 2017-12-02 — End: 2017-12-02
  Administered 2017-12-02: 150 mg via INTRAMUSCULAR

## 2017-12-02 NOTE — Progress Notes (Signed)
Danaly Tre Torain here for Depo-Provera  Injection.  Injection administered without complication. Patient will return for annual exam, would like to start Nexplanon.  Garret ReddishBarnes, Anothony Bursch M, RN 12/02/2017  9:29 AM

## 2017-12-14 ENCOUNTER — Encounter (HOSPITAL_COMMUNITY): Payer: Self-pay | Admitting: Emergency Medicine

## 2017-12-14 ENCOUNTER — Emergency Department (HOSPITAL_COMMUNITY)
Admission: EM | Admit: 2017-12-14 | Discharge: 2017-12-14 | Disposition: A | Discharge status: Home / Self Care | Payer: Medicaid Other | Attending: Emergency Medicine | Admitting: Emergency Medicine

## 2017-12-14 DIAGNOSIS — L0291 Cutaneous abscess, unspecified: Secondary | ICD-10-CM

## 2017-12-14 DIAGNOSIS — J45909 Unspecified asthma, uncomplicated: Secondary | ICD-10-CM | POA: Insufficient documentation

## 2017-12-14 DIAGNOSIS — L02411 Cutaneous abscess of right axilla: Secondary | ICD-10-CM | POA: Insufficient documentation

## 2017-12-14 DIAGNOSIS — Z87891 Personal history of nicotine dependence: Secondary | ICD-10-CM | POA: Insufficient documentation

## 2017-12-14 DIAGNOSIS — Z79899 Other long term (current) drug therapy: Secondary | ICD-10-CM | POA: Insufficient documentation

## 2017-12-14 MED ORDER — LIDOCAINE-EPINEPHRINE (PF) 2 %-1:200000 IJ SOLN
10.0000 mL | Freq: Once | INTRAMUSCULAR | Status: AC
  Administered 2017-12-14: 10 mL via INTRADERMAL
  Filled 2017-12-14: qty 20

## 2017-12-14 MED ORDER — DOXYCYCLINE HYCLATE 100 MG PO CAPS: 100.0000 mg | ORAL_CAPSULE | Freq: Two times a day (BID) | ORAL | 0 refills | Status: AC

## 2017-12-14 NOTE — ED Triage Notes (Signed)
Per Pt; c/o abscess in her R armpit. Pt states it has been there since last Friday, x 4 days. Pt has Hx of these and had them drained before in the past. Pt A&Ox4, ambulatory.

## 2017-12-14 NOTE — ED Provider Notes (Signed)
[facility] EMERGENCY DEPARTMENT Provider Note   CSN: [id number] Arrival date & time: 12/14/17  1306     History   Chief Complaint Chief Complaint  Patient presents with  . Recurrent Skin Infections    R axillary     HPI Vanessa Foster is a 32 y.o. female.  HPI   Pt is 32 y/o female who presents to the ED c/o boil to her right axilla that began about 4-5 days ago. States she has not had any drainage from the area. Area is red and painful. States pain is 6/10 and is worse with movement of her arms. Has tried taking ibuprofen. Has also used boil ease with no relief. Has noted chills, but has not had any documented fevers at home. Denies any numbness or weakness to arms.  No NV. No cp or sob.   Pt has a h/o recurrent boils to axillary area. States she has seen dermatology in the past but has not has an appt for years.   Past Medical History:  Diagnosis Date  . Abnormal Pap smear of cervix    2011?  Marland Kitchen Allergy   . Anemia   . Anxiety   . Asthma    hospitalization 2008, last attack this month  . Chronic nausea   . Depression   . Dysmenorrhea   . Dyspareunia   . Gestational diabetes 07/13/2016   no meds  . Migraines   . PID (pelvic inflammatory disease)   . Wears glasses     Patient Active Problem List   Diagnosis Date Noted  . S/P primary low transverse C-section 10/26/2016  . Normal postpartum course 10/26/2016  . Gestational diabetes mellitus 09/19/2016  . Anemia of mother in pregnancy, antepartum 07/29/2016  . Daytime somnolence 07/11/2015  . Sleep disorder 07/11/2015  . Cephalalgia 07/11/2015  . Asthma, mild intermittent 07/11/2015  . Depressed mood 07/11/2015  . Chronic nausea 07/11/2015  . Changing skin lesion 07/11/2015  . Adjustment disorder with mixed anxiety and depressed mood 07/11/2015  . Hemorrhoids 04/30/2014    Past Surgical History:  Procedure Laterality Date  . CESAREAN SECTION    . CESAREAN SECTION N/A 09/19/2016   Procedure: CESAREAN SECTION;  Surgeon: Newman Bing, MD;  Location: [facility] BIRTHING SUITES;  Service: Obstetrics;  Laterality: N/A;  . implanon     inserted 2010  . WISDOM TOOTH EXTRACTION       OB History    Gravida  2   Para  1   Term  1   Preterm      AB      Living  1     SAB      TAB      Ectopic      Multiple      Live Births  1            Home Medications    Prior to Admission medications   Medication Sig Start Date End Date Taking? Authorizing Provider  albuterol (PROVENTIL HFA;VENTOLIN HFA) 108 (90 Base) MCG/ACT inhaler Inhale 2 puffs into the lungs every 6 (six) hours as needed for wheezing or shortness of breath. 06/02/16   Degele, Kandra Nicolas, MD  doxycycline (VIBRAMYCIN) 100 MG capsule Take 1 capsule (100 mg total) by mouth 2 (two) times daily for 10 days. 12/14/17 12/24/17  Leigh Blas S, PA-C  ibuprofen (ADVIL,MOTRIN) 600 MG tablet Take 1 tablet (600 mg total) by mouth every 6 (six) hours as needed for mild pain. Patient  not taking: Reported on 10/26/2016 09/22/16   Lorne Skeens, MD  naproxen (NAPROSYN) 500 MG tablet Take 1 tablet (500 mg total) by mouth 2 (two) times daily. 10/06/17   Petrucelli, Samantha R, PA-C  oxyCODONE 10 MG TABS Take 1 tablet (10 mg total) by mouth every 4 (four) hours as needed (pain scale > 7). Patient not taking: Reported on 10/26/2016 09/22/16   Lorne Skeens, MD  sertraline (ZOLOFT) 50 MG tablet Take 1 tablet (50 mg total) by mouth daily. 10/26/16 10/26/17  Marylene Land, CNM    Family History Family History  Problem Relation Age of Onset  . Hypertension Mother   . Heart disease Mother        early 50s  . Diabetes Mother   . Diabetes Paternal Grandmother   . Stroke Paternal Grandmother     Social History Social History   Tobacco Use  . Smoking status: Former Smoker    Last attempt to quit: 03/20/2008    Years since quitting: 9.7  . Smokeless tobacco: Never Used  Substance Use Topics  .  Alcohol use: No    Alcohol/week: 0.0 oz  . Drug use: No     Allergies   Patient has no known allergies.   Review of Systems Review of Systems  Constitutional: Positive for chills.       Subjective fever  HENT: Negative for sore throat.   Respiratory: Negative for shortness of breath.   Cardiovascular: Negative for chest pain.  Gastrointestinal: Negative for nausea and vomiting.  Skin:       Boil to right axilla  Neurological: Positive for weakness. Negative for numbness.     Physical Exam Updated Vital Signs BP (!) 141/92 (BP Location: Right Arm)   Pulse (!) 106   Temp 98.9 F (37.2 C) (Oral)   Resp 16   Ht 5\' 2"  (1.575 m)   Wt 95.7 kg (211 lb)   SpO2 100%   BMI 38.59 kg/m   Physical Exam  Constitutional: She appears well-developed and well-nourished. No distress.  HENT:  Head: Normocephalic and atraumatic.  Eyes: Conjunctivae are normal.  Neck: Neck supple.  Cardiovascular: Normal rate, regular rhythm and normal heart sounds.  Pulmonary/Chest: Effort normal.  Abdominal: Soft. There is no tenderness.  Musculoskeletal: Normal range of motion.  FROM of BUE. Normal sensation and strength bilat. Radial pulses intact bilat.   Neurological: She is alert.  Skin: Skin is warm and dry.  Multiple areas of fluctuance to right axilla. Some surrounding erythema as well. Moderate ttp to areas of fluctuance. there was noted to be a <0.5cm opening, on palpitation the abscess began to spontaneously drain a moderate amount of purulent/bloody fluid.   Psychiatric: She has a normal mood and affect.  Nursing note and vitals reviewed.    ED Treatments / Results  Labs (all labs ordered are listed, but only abnormal results are displayed) Labs Reviewed - No data to display  EKG None  Radiology No results found.  Procedures .Marland KitchenIncision and Drainage Date/Time: 12/14/2017 4:19 PM Performed by: Karrie Meres, PA-C Authorized by: Karrie Meres, PA-C   Consent:     Consent obtained:  Verbal   Consent given by:  Patient   Risks discussed:  Bleeding, incomplete drainage and pain   Alternatives discussed:  No treatment Location:    Type:  Abscess   Location: axilla. Pre-procedure details:    Skin preparation:  Betadine Anesthesia (see MAR for exact dosages):    Anesthesia  method:  Local infiltration   Local anesthetic:  Lidocaine 1% WITH epi Procedure type:    Complexity:  Complex Procedure details:    Incision type: open wound, sponateous drainage, no stab incision necessary.   Wound management:  Probed and deloculated and irrigated with saline   Drainage:  Bloody and purulent   Drainage amount:  Copious   Wound treatment:  Wound left open   Packing materials:  1/4 in iodoform gauze   Amount 1/4" iodoform:  3 inch Post-procedure details:    Patient tolerance of procedure:  Tolerated well, no immediate complications   (including critical care time)  Medications Ordered in ED Medications  lidocaine-EPINEPHrine (XYLOCAINE W/EPI) 2 %-1:200000 (PF) injection 10 mL (10 mLs Intradermal Given 12/14/17 1600)     Initial Impression / Assessment and Plan / ED Course  I have reviewed the triage vital signs and the nursing notes.  Pertinent labs & imaging results that were available during my care of the patient were reviewed by me and considered in my medical decision making (see chart for details).     Final Clinical Impressions(s) / ED Diagnoses   Final diagnoses:  Abscess   Patient with skin abscess amenable to incision and drainage.  Abscess was packed with about 3 inches of 1/4" gauze. advised wound recheck in 2 days and removal of packing in 48 hours. Encouraged home warm soaks and flushing.  Mild signs of cellulitis is surrounding skin.  Will d/c to home with doxycycline. Advised her to f/u with pcp and to contact her dermatologist for further tx as this is likely hydradenitis suppurativa and may need definitive management. Pt VSS and  afebrile. Was slightly tachycardic and HTN at discharge, which was not communicated to me. Both likely secondary to pain from I&D procedure. Do not suspect any urgent/emegent underlying pathology to cause abnormality. Pt agrees to f/u with pcp. Advised to return if worse. All questions answered and pt understands plan and reasons to return.    ED Discharge Orders        Ordered    doxycycline (VIBRAMYCIN) 100 MG capsule  2 times daily     12/14/17 [address]       Dietrich Samuelson S, PA-C 12/14/17 1623    Margarita Grizzleay, Danielle, MD 12/14/17 1654

## 2017-12-14 NOTE — Discharge Instructions (Addendum)
You were given a prescription for antibiotics. Please take the antibiotic prescription fully.  You should use warm compresses on the affected areas to help with drainage from the wound.  You need to remove the wound packing in 48 hours.  You may take over-the-counter Tylenol and ibuprofen to help with pain.  Please follow up with your primary care provider within 5-7 days for re-evaluation of your symptoms. Please return to the emergency room immediately if you experience any new or worsening symptoms or any symptoms that indicate worsening infection such as fevers, increased redness/swelling/pain, warmth, or drainage from the affected area.

## 2018-01-04 ENCOUNTER — Encounter: Payer: Self-pay | Admitting: *Deleted

## 2018-01-06 ENCOUNTER — Ambulatory Visit (INDEPENDENT_AMBULATORY_CARE_PROVIDER_SITE_OTHER): Payer: Medicaid Other | Admitting: Student

## 2018-01-06 ENCOUNTER — Other Ambulatory Visit (HOSPITAL_COMMUNITY)
Admission: RE | Admit: 2018-01-06 | Discharge: 2018-01-06 | Disposition: A | Discharge status: Home / Self Care | Payer: Medicaid Other | Source: Ambulatory Visit | Attending: Student | Admitting: Student

## 2018-01-06 ENCOUNTER — Encounter: Payer: Self-pay | Admitting: Student

## 2018-01-06 VITALS — BP 107/81 | HR 93 | Ht 62.0 in | Wt 207.0 lb

## 2018-01-06 DIAGNOSIS — B373 Candidiasis of vulva and vagina: Secondary | ICD-10-CM | POA: Insufficient documentation

## 2018-01-06 DIAGNOSIS — Z0000 Encounter for general adult medical examination without abnormal findings: Secondary | ICD-10-CM

## 2018-01-06 DIAGNOSIS — Z01419 Encounter for gynecological examination (general) (routine) without abnormal findings: Secondary | ICD-10-CM | POA: Insufficient documentation

## 2018-01-06 DIAGNOSIS — B379 Candidiasis, unspecified: Secondary | ICD-10-CM | POA: Diagnosis not present

## 2018-01-06 DIAGNOSIS — Z30019 Encounter for initial prescription of contraceptives, unspecified: Secondary | ICD-10-CM

## 2018-01-06 LAB — POCT PREGNANCY, URINE: Preg Test, Ur: NEGATIVE

## 2018-01-06 MED ORDER — FLUCONAZOLE 150 MG PO TABS: 150.0000 mg | ORAL_TABLET | Freq: Once | ORAL | 1 refills | Status: AC

## 2018-01-06 MED ORDER — ETONOGESTREL 68 MG ~~LOC~~ IMPL
68.0000 mg | DRUG_IMPLANT | Freq: Once | SUBCUTANEOUS | Status: AC
  Administered 2018-01-06: 68 mg via SUBCUTANEOUS

## 2018-01-06 NOTE — Progress Notes (Signed)
Subjective:     Patient ID: Oralia Maniseashelle Tre Gilbo, female   DOB: Sep 26, 1985, 32 y.o.   MRN: [id number]  HPI Patient Blayne Tre Marijean BravoMadkins is a 32 y.o. [id number] here for routine gyn exam and pap smear. She has had no changes to her medical history. She denies fever, dysuria, low back pain, vaginal bleeding, vaginal pain. She endorses itching and increased white discharge for the past 5 days.   Review of Systems  Constitutional: Negative.   HENT: Negative.   Respiratory: Negative.   Cardiovascular: Negative.   Gastrointestinal: Negative.   Genitourinary: Positive for vaginal discharge.  Musculoskeletal: Negative.   Neurological: Negative.   Hematological: Negative.   Psychiatric/Behavioral: Negative.        Objective:   Physical Exam  Constitutional: She is oriented to person, place, and time. She appears well-developed.  HENT:  Head: Normocephalic.  Eyes: Pupils are equal, round, and reactive to light.  Neck: Normal range of motion.  Abdominal: Soft.  Genitourinary:  Genitourinary Comments: Normal external female genitalia; vaginal walls are reddened, clumpy white discharge present in the vagina. Cervix is pink and erythematous. No CMT, suprapubic, adnexal masses or tenderness.   Musculoskeletal: Normal range of motion.  Neurological: She is alert and oriented to person, place, and time.  Skin: Skin is warm and dry.  Psychiatric: She has a normal mood and affect.  Breast exam: normal, no lumps or masses. No tenderness.      Assessment:     1. Pap/Well woman exam 2. Vaginal yeast infection 3. Nexplanon successfully placed 4. UPT negative.     Plan:     1. See Nexplanon insertion note 2. Diflucan with refill ordered.  3. Recommended OTC Monistat 4. Patient knows we will call her if she has any abnormal pap results.   Luna KitchensKathryn Jarrid Lienhard    NEXPLANON INSERTION: Appropriate time out taken. Nexlanon site (left arm) identified and the area was prepped in usual sterile  fashon. 2 cc of 1% lidocaine was used to anesthetize the area starting with the distal end.   Next, the area was cleansed with betadine and the Nexplanon was inserted without difficulty.  Pressure bandage was applied.  Pt was instructed to remove pressure bandage in a few hours, and keep insertion site covered with a bandaid for 3 days.

## 2018-01-06 NOTE — Patient Instructions (Addendum)
For yeast infection, take one Diflucan pill on the day you pick up from the pharmacy, and then refill and take the 2nd pill 3 days later.  -use Monistat 7 over the counter vaginal cream as well.   Nexplanon Instructions After Insertion   Keep bandage clean and dry for 24 hours   May use ice/Tylenol/Ibuprofen for soreness or pain   If you develop fever, drainage or increased warmth from incision site-contact office immediately

## 2018-01-11 LAB — CYTOLOGY - PAP
Bacterial vaginitis: NEGATIVE
Candida vaginitis: POSITIVE — AB
Chlamydia: NEGATIVE
Diagnosis: NEGATIVE
Neisseria Gonorrhea: NEGATIVE
Trichomonas: NEGATIVE

## 2018-01-12 ENCOUNTER — Inpatient Hospital Stay (HOSPITAL_COMMUNITY)
Admission: AD | Admit: 2018-01-12 | Discharge: 2018-01-12 | Disposition: A | Discharge status: Home / Self Care | Payer: Medicaid Other | Source: Ambulatory Visit | Attending: Obstetrics & Gynecology | Admitting: Obstetrics & Gynecology

## 2018-01-12 ENCOUNTER — Telehealth: Payer: Self-pay | Admitting: General Practice

## 2018-01-12 ENCOUNTER — Encounter: Payer: Self-pay | Admitting: *Deleted

## 2018-01-12 ENCOUNTER — Other Ambulatory Visit: Payer: Self-pay

## 2018-01-12 DIAGNOSIS — Z87891 Personal history of nicotine dependence: Secondary | ICD-10-CM | POA: Insufficient documentation

## 2018-01-12 DIAGNOSIS — Z79899 Other long term (current) drug therapy: Secondary | ICD-10-CM | POA: Insufficient documentation

## 2018-01-12 DIAGNOSIS — L259 Unspecified contact dermatitis, unspecified cause: Secondary | ICD-10-CM | POA: Insufficient documentation

## 2018-01-12 DIAGNOSIS — L239 Allergic contact dermatitis, unspecified cause: Secondary | ICD-10-CM

## 2018-01-12 NOTE — Telephone Encounter (Signed)
Patient called and left message on nurse voicemail line stating she had a notice she has results in mychart but cannot see anything. Patient is requesting a call back with results. Called patient, no answer- left message on voicemail stating we are trying to reach you to return your phone call. Your results were normal. You may call us back if you have questions.

## 2018-01-12 NOTE — MAU Provider Note (Signed)
History     CSN: [id number]  Arrival date and time: 01/12/18 1616   First Provider Initiated Contact with Patient 01/12/18 1743      Chief Complaint  Patient presents with  . poss allergic reaction   HPI Problem with Nexplanon insertion site  OB History    Gravida  2   Para  2   Term  2   Preterm      AB      Living  1     SAB      TAB      Ectopic      Multiple      Live Births  1           Past Medical History:  Diagnosis Date  . Abnormal Pap smear of cervix    2011?  Marland Kitchen. Allergy   . Anemia   . Anxiety   . Asthma    hospitalization 2008, last attack this month  . Chronic nausea   . Depression   . Dysmenorrhea   . Dyspareunia   . Gestational diabetes 07/13/2016   no meds  . Migraines   . PID (pelvic inflammatory disease)   . Wears glasses     Past Surgical History:  Procedure Laterality Date  . CESAREAN SECTION    . CESAREAN SECTION N/A 09/19/2016   Procedure: CESAREAN SECTION;  Surgeon: Force Bingharlie Pickens, MD;  [facility];  Service: Obstetrics;  Laterality: N/A;  . implanon     inserted 2010  . INCISE AND DRAIN ABCESS  2019   under arms " boils"  . WISDOM TOOTH EXTRACTION      Family History  Problem Relation Age of Onset  . Hypertension Mother   . Heart disease Mother        early 5740s  . Diabetes Mother   . Diabetes Paternal Grandmother   . Stroke Paternal Grandmother     Social History   Tobacco Use  . Smoking status: Former Smoker    Last attempt to quit: 03/20/2008    Years since quitting: 9.8  . Smokeless tobacco: Never Used  Substance Use Topics  . Alcohol use: No    Alcohol/week: 0.0 oz  . Drug use: No    Allergies: No Known Allergies  Medications Prior to Admission  Medication Sig Dispense Refill Last Dose  . albuterol (PROVENTIL HFA;VENTOLIN HFA) 108 (90 Base) MCG/ACT inhaler Inhale 2 puffs into the lungs every 6 (six) hours as needed for wheezing or shortness of breath. 1 Inhaler 3 Taking   . ibuprofen (ADVIL,MOTRIN) 600 MG tablet Take 1 tablet (600 mg total) by mouth every 6 (six) hours as needed for mild pain. 30 tablet 0 Taking  . naproxen (NAPROSYN) 500 MG tablet Take 1 tablet (500 mg total) by mouth 2 (two) times daily. 30 tablet 0   . oxyCODONE 10 MG TABS Take 1 tablet (10 mg total) by mouth every 4 (four) hours as needed (pain scale > 7). (Patient not taking: Reported on 10/26/2016) 30 tablet 0 Not Taking  . sertraline (ZOLOFT) 50 MG tablet Take 1 tablet (50 mg total) by mouth daily. 30 tablet 2     Review of Systems  Constitutional: Negative for fever.  Gastrointestinal: Negative for abdominal pain.  Genitourinary: Negative for vaginal bleeding and vaginal discharge.  Skin:       Rash over Nexplanon   Physical Exam   Blood pressure 123/83, pulse 86, temperature 98.9 F (37.2 C), temperature source Oral,  resp. rate 17, weight 205 lb 8 oz (93.2 kg), SpO2 99 %.  Physical Exam  Nursing note and vitals reviewed. Constitutional: She is oriented to person, place, and time. She appears well-developed and well-nourished.  HENT:  Head: Normocephalic.  Eyes: EOM are normal.  Neck: Neck supple.  GI: Soft.  Musculoskeletal: Normal range of motion.  Neurological: She is alert and oriented to person, place, and time.  Skin: Skin is warm and dry.  2cm x 3 cm area of fine raised rash over Nexplanon site.  Client reports rash is much improved from yesterday.  Rash still itches.  Nexplanon rod palpated.  Psychiatric: She has a normal mood and affect.    MAU Course  Procedures No results found for this or any previous visit (from the past 24 hour(s)).  MDM Reassured client this is from something coming in contact with her skin.  Do not think she is allergic to the nexplanon rod.  Assessment and Plan  Contact dermatitis at Nexplanon site  Plan OTC hydrocortisone cream to the site. Is currently taking claritin daily. Can add benadryl by the package directions at bedtime  if desired for the itching. Contact the clinic if the rash is worsening but expect it to get better and resolve over time.  Lexi Conaty L Shuntavia Yerby 01/12/2018, 5:49 PM

## 2018-01-12 NOTE — Discharge Instructions (Signed)
Benadryl by the package directions at night. Anti-itch hydrocortisone cream to site

## 2018-01-12 NOTE — MAU Note (Signed)
Got the birth control put in the arm on Thursday.   Started itching on Sat, started bumping up on today. Was told if she had any problems to come here.

## 2018-02-17 ENCOUNTER — Ambulatory Visit: Payer: Self-pay

## 2018-07-14 ENCOUNTER — Encounter: Payer: Self-pay | Admitting: *Deleted

## 2018-09-08 ENCOUNTER — Emergency Department (HOSPITAL_COMMUNITY)
Admission: EM | Admit: 2018-09-08 | Discharge: 2018-09-08 | Disposition: A | Discharge status: Home / Self Care | Payer: Medicaid Other | Attending: Emergency Medicine | Admitting: Emergency Medicine

## 2018-09-08 ENCOUNTER — Encounter (HOSPITAL_COMMUNITY): Payer: Self-pay

## 2018-09-08 DIAGNOSIS — Z79899 Other long term (current) drug therapy: Secondary | ICD-10-CM | POA: Insufficient documentation

## 2018-09-08 DIAGNOSIS — J45909 Unspecified asthma, uncomplicated: Secondary | ICD-10-CM | POA: Insufficient documentation

## 2018-09-08 DIAGNOSIS — Z87891 Personal history of nicotine dependence: Secondary | ICD-10-CM | POA: Insufficient documentation

## 2018-09-08 DIAGNOSIS — J020 Streptococcal pharyngitis: Secondary | ICD-10-CM | POA: Insufficient documentation

## 2018-09-08 LAB — GROUP A STREP BY PCR: Group A Strep by PCR: DETECTED — AB

## 2018-09-08 MED ORDER — PENICILLIN G BENZATHINE 1200000 UNIT/2ML IM SUSP
1.2000 10*6.[IU] | Freq: Once | INTRAMUSCULAR | Status: AC
  Administered 2018-09-08: 1.2 10*6.[IU] via INTRAMUSCULAR
  Filled 2018-09-08: qty 2

## 2018-09-08 MED ORDER — IBUPROFEN 800 MG PO TABS
800.0000 mg | ORAL_TABLET | Freq: Once | ORAL | Status: AC
  Administered 2018-09-08: 800 mg via ORAL
  Filled 2018-09-08: qty 1

## 2018-09-08 NOTE — ED Provider Notes (Signed)
[facility]-EMERGENCY DEPT Provider Note   CSN: [id number] Arrival date & time: 09/08/18  0810     History   Chief Complaint Chief Complaint  Patient presents with  . Sore Throat    HPI Vanessa Foster is a 32 y.o. female history of anemia, migraines here presenting with sore throat.  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  Patient works night shift last night and noticed some right sided sore throat as well as ear pain.  Patient states that she noted that the right side of her neck seems a little swollen as well.  She states that she is able to swallow but has some pain when she swallows.  Denies any fever and does have a nonproductive cough.  Patient states that the nursing home residents were sick as well.  She denies any other sick contacts.   The history is provided by the patient.    Past Medical History:  Diagnosis Date  . Abnormal Pap smear of cervix    2011?  Marland Kitchen Allergy   . Anemia   . Anxiety   . Asthma    hospitalization 2008, last attack this month  . Chronic nausea   . Depression   . Dysmenorrhea   . Dyspareunia   . Gestational diabetes 07/13/2016   no meds  . Migraines   . PID (pelvic inflammatory disease)   . Wears glasses     Patient Active Problem List   Diagnosis Date Noted  . Yeast infection 01/06/2018  . S/P primary low transverse C-section 10/26/2016  . Normal postpartum course 10/26/2016  . Gestational diabetes mellitus 09/19/2016  . Anemia of mother in pregnancy, antepartum 07/29/2016  . Daytime somnolence 07/11/2015  . Sleep disorder 07/11/2015  . Cephalalgia 07/11/2015  . Asthma, mild intermittent 07/11/2015  . Well woman exam with routine gynecological exam 07/11/2015  . Depressed mood 07/11/2015  . Chronic nausea 07/11/2015  . Changing skin lesion 07/11/2015  . Adjustment disorder with mixed anxiety and depressed mood 07/11/2015  . Hemorrhoids 04/30/2014    Past Surgical History:  Procedure  Laterality Date  . CESAREAN SECTION    . CESAREAN SECTION N/A 09/19/2016   Procedure: CESAREAN SECTION;  Surgeon: Egan Bing, MD;  Location: [facility] BIRTHING SUITES;  Service: Obstetrics;  Laterality: N/A;  . implanon     inserted 2010  . INCISE AND DRAIN ABCESS  2019   under arms " boils"  . WISDOM TOOTH EXTRACTION       OB History    Gravida  2   Para  2   Term  2   Preterm      AB      Living  1     SAB      TAB      Ectopic      Multiple      Live Births  1            Home Medications    Prior to Admission medications   Medication Sig Start Date End Date Taking? Authorizing Provider  albuterol (PROVENTIL HFA;VENTOLIN HFA) 108 (90 Base) MCG/ACT inhaler Inhale 2 puffs into the lungs every 6 (six) hours as needed for wheezing or shortness of breath. 06/02/16   Degele, Kandra Nicolas, MD  sertraline (ZOLOFT) 50 MG tablet Take 1 tablet (50 mg total) by mouth daily. 10/26/16 10/26/17  Marylene Land, CNM    Family History Family History  Problem Relation Age of Onset  .  Hypertension Mother   . Heart disease Mother        early 2740s  . Diabetes Mother   . Diabetes Paternal Grandmother   . Stroke Paternal Grandmother     Social History Social History   Tobacco Use  . Smoking status: Former Smoker    Last attempt to quit: 03/20/2008    Years since quitting: 10.4  . Smokeless tobacco: Never Used  Substance Use Topics  . Alcohol use: No    Alcohol/week: 0.0 standard drinks  . Drug use: No     Allergies   Patient has no known allergies.   Review of Systems Review of Systems  HENT: Positive for sore throat.   All other systems reviewed and are negative.    Physical Exam Updated Vital Signs BP 127/88 (BP Location: Left Arm)   Pulse 95   Temp 98.1 F (36.7 C) (Oral)   Resp 18   SpO2 99%   Physical Exam Vitals signs and nursing note reviewed.  Constitutional:      Appearance: She is well-developed.  HENT:     Head: Normocephalic.       Right Ear: Tympanic membrane normal.     Left Ear: Tympanic membrane normal.     Mouth/Throat:     Tonsils: Tonsillar exudate present.     Comments: R tonsil swollen, mildly erythematous, some R tonsillar exudates. Uvula midline  Eyes:     Conjunctiva/sclera: Conjunctivae normal.     Pupils: Pupils are equal, round, and reactive to light.  Neck:     Musculoskeletal: Normal range of motion.     Comments: Mild R cervical LAD  Cardiovascular:     Rate and Rhythm: Normal rate and regular rhythm.  Pulmonary:     Effort: Pulmonary effort is normal.     Breath sounds: Normal breath sounds.  Abdominal:     General: Bowel sounds are normal.     Palpations: Abdomen is soft.  Skin:    General: Skin is warm.     Capillary Refill: Capillary refill takes less than 2 seconds.  Neurological:     General: No focal deficit present.     Mental Status: She is alert and oriented to person, place, and time.  Psychiatric:        Mood and Affect: Mood normal.        Behavior: Behavior normal.      ED Treatments / Results  Labs (all labs ordered are listed, but only abnormal results are displayed) Labs Reviewed  GROUP A STREP BY PCR - Abnormal; Notable for the following components:      Result Value   Group A Strep by PCR DETECTED (*)    All other components within normal limits    EKG None  Radiology No results found.  Procedures Procedures (including critical care time)  Medications Ordered in ED Medications  penicillin g benzathine (BICILLIN LA) 1200000 UNIT/2ML injection 1.2 Million Units (has no administration in time range)  ibuprofen (ADVIL,MOTRIN) tablet 800 mg (800 mg Oral Given 09/08/18 0841)     Initial Impression / Assessment and Plan / ED Course  I have reviewed the triage vital signs and the nursing notes.  Pertinent labs & imaging results that were available during my care of the patient were reviewed by me and considered in my medical decision making (see  chart for details).     Vanessa Foster is a 32 y.o. female here with sore throat. R tonsil with exudates, no  obvious PTA however. 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 Will get Group A strep   9:00 AM Group A strep positive. Offered PO abx vs bicillin and patient wanted to get bicillin. Stable for discharge.    Final Clinical Impressions(s) / ED Diagnoses   Final diagnoses:  None    ED Discharge Orders    None       Charlynne PanderYao, Taela Charbonneau Hsienta, MD 09/08/18 0900

## 2018-09-08 NOTE — Discharge Instructions (Signed)
You have strep throat and you received IM bicillin already.   Take tylenol, motrin for sore throat, fever.   Rest until Monday   Stay hydrated   See your doctor  Return to ER if you have worse sore throat, trouble swallowing, fever, vomiting.

## 2018-09-08 NOTE — ED Triage Notes (Addendum)
Pt reports sore throat, right tonsil inflammation,& dry cough x2 days that radiates to right ear.  5/10 pain  Denies n/v or diarrhea.   A/Ox4.  Ambulatory

## 2018-12-25 ENCOUNTER — Encounter (HOSPITAL_COMMUNITY): Payer: Self-pay | Admitting: Emergency Medicine

## 2018-12-25 ENCOUNTER — Emergency Department (HOSPITAL_COMMUNITY)
Admission: EM | Admit: 2018-12-25 | Discharge: 2018-12-25 | Disposition: A | Discharge status: Home / Self Care | Payer: Self-pay | Attending: Emergency Medicine | Admitting: Emergency Medicine

## 2018-12-25 ENCOUNTER — Other Ambulatory Visit: Payer: Self-pay

## 2018-12-25 ENCOUNTER — Ambulatory Visit (HOSPITAL_COMMUNITY)
Admission: EM | Admit: 2018-12-25 | Discharge: 2018-12-25 | Disposition: A | Discharge status: Other Acute Inpatient Hospital | Payer: Self-pay | Attending: Family Medicine | Admitting: Family Medicine

## 2018-12-25 DIAGNOSIS — K922 Gastrointestinal hemorrhage, unspecified: Secondary | ICD-10-CM | POA: Insufficient documentation

## 2018-12-25 DIAGNOSIS — Z8709 Personal history of other diseases of the respiratory system: Secondary | ICD-10-CM | POA: Insufficient documentation

## 2018-12-25 DIAGNOSIS — R110 Nausea: Secondary | ICD-10-CM | POA: Insufficient documentation

## 2018-12-25 DIAGNOSIS — K920 Hematemesis: Secondary | ICD-10-CM

## 2018-12-25 DIAGNOSIS — Z79899 Other long term (current) drug therapy: Secondary | ICD-10-CM | POA: Insufficient documentation

## 2018-12-25 DIAGNOSIS — Z87891 Personal history of nicotine dependence: Secondary | ICD-10-CM | POA: Insufficient documentation

## 2018-12-25 DIAGNOSIS — R1030 Lower abdominal pain, unspecified: Secondary | ICD-10-CM

## 2018-12-25 DIAGNOSIS — R10815 Periumbilic abdominal tenderness: Secondary | ICD-10-CM | POA: Insufficient documentation

## 2018-12-25 LAB — URINALYSIS, ROUTINE W REFLEX MICROSCOPIC
Bilirubin Urine: NEGATIVE
Glucose, UA: NEGATIVE mg/dL
Hgb urine dipstick: NEGATIVE
Ketones, ur: NEGATIVE mg/dL
Leukocytes,Ua: NEGATIVE
Nitrite: NEGATIVE
Protein, ur: NEGATIVE mg/dL
Specific Gravity, Urine: 1.02 (ref 1.005–1.030)
pH: 5 (ref 5.0–8.0)

## 2018-12-25 LAB — CBC WITH DIFFERENTIAL/PLATELET
Abs Immature Granulocytes: 0.04 10*3/uL (ref 0.00–0.07)
Basophils Absolute: 0.1 10*3/uL (ref 0.0–0.1)
Basophils Relative: 1 %
Eosinophils Absolute: 0.5 10*3/uL (ref 0.0–0.5)
Eosinophils Relative: 6 %
HCT: 41.5 % (ref 36.0–46.0)
Hemoglobin: 12.7 g/dL (ref 12.0–15.0)
Immature Granulocytes: 1 %
Lymphocytes Relative: 39 %
Lymphs Abs: 3.3 10*3/uL (ref 0.7–4.0)
MCH: 24.9 pg — ABNORMAL LOW (ref 26.0–34.0)
MCHC: 30.6 g/dL (ref 30.0–36.0)
MCV: 81.2 fL (ref 80.0–100.0)
Monocytes Absolute: 0.5 10*3/uL (ref 0.1–1.0)
Monocytes Relative: 6 %
Neutro Abs: 4.1 10*3/uL (ref 1.7–7.7)
Neutrophils Relative %: 47 %
Platelets: 262 10*3/uL (ref 150–400)
RBC: 5.11 MIL/uL (ref 3.87–5.11)
RDW: 14.4 % (ref 11.5–15.5)
WBC: 8.5 10*3/uL (ref 4.0–10.5)
nRBC: 0 % (ref 0.0–0.2)

## 2018-12-25 LAB — COMPREHENSIVE METABOLIC PANEL
ALT: 17 U/L (ref 0–44)
AST: 22 U/L (ref 15–41)
Albumin: 3.8 g/dL (ref 3.5–5.0)
Alkaline Phosphatase: 53 U/L (ref 38–126)
Anion gap: 9 (ref 5–15)
BUN: 8 mg/dL (ref 6–20)
CO2: 24 mmol/L (ref 22–32)
Calcium: 9.2 mg/dL (ref 8.9–10.3)
Chloride: 104 mmol/L (ref 98–111)
Creatinine, Ser: 0.69 mg/dL (ref 0.44–1.00)
GFR calc Af Amer: >60 mL/min (ref >60)
GFR calc non Af Amer: >60 mL/min (ref >60)
Glucose, Bld: 88 mg/dL (ref 70–99)
Potassium: 3.7 mmol/L (ref 3.5–5.1)
Sodium: 137 mmol/L (ref 135–145)
Total Bilirubin: 1 mg/dL (ref 0.3–1.2)
Total Protein: 7.5 g/dL (ref 6.5–8.1)

## 2018-12-25 LAB — LIPASE, BLOOD: Lipase: 21 U/L (ref 11–51)

## 2018-12-25 NOTE — ED Provider Notes (Signed)
[facility]    CSN: [id number] Arrival date & time: 12/25/18  1527     History   Chief Complaint Chief Complaint  Patient presents with  . Hematemesis    HPI Roizy Tre Gehret is a 33 y.o. female.   Briah Tre Wachter presents with complaints of low abdominal pain. States had an episode early this morning in which she woke due to what she thought at the time was excess saliva. She got up to spit it out and noticed it was bloody, which was then followed up by large amount of blood production, even causing her to feel like she was choking. She feels it was bright red. EMS was called. By the time they arrived the bleeding had stopped. She had some abdominal pain at the time and nausea. She feels that over the past few hours the abdominal pain has returned and persisted. No further nausea. Denies any previous similar. States had a brown bm yesterday. Has felt lightheaded intermittently today. Pain 4/10. Hasn't taken any medications for symptoms. Ate steak and blue cool-aid for dinner last night. Denies smoking, regular alcohol intake or use of NSAIDS. Has been taking zyrtec recently but no other medications. No sore throat. Hx of allergies, anxiety, anemia, asthma, depression, migraines, nausea.    ROS per HPI, negative if not otherwise mentioned.      Past Medical History:  Diagnosis Date  . Abnormal Pap smear of cervix    2011?  Marland Kitchen Allergy   . Anemia   . Anxiety   . Asthma    hospitalization 2008, last attack this month  . Chronic nausea   . Depression   . Dysmenorrhea   . Dyspareunia   . Gestational diabetes 07/13/2016   no meds  . Migraines   . PID (pelvic inflammatory disease)   . Wears glasses     Patient Active Problem List   Diagnosis Date Noted  . Yeast infection 01/06/2018  . S/P primary low transverse C-section 10/26/2016  . Normal postpartum course 10/26/2016  . Gestational diabetes mellitus 09/19/2016  . Anemia of mother in pregnancy,  antepartum 07/29/2016  . Daytime somnolence 07/11/2015  . Sleep disorder 07/11/2015  . Cephalalgia 07/11/2015  . Asthma, mild intermittent 07/11/2015  . Well woman exam with routine gynecological exam 07/11/2015  . Depressed mood 07/11/2015  . Chronic nausea 07/11/2015  . Changing skin lesion 07/11/2015  . Adjustment disorder with mixed anxiety and depressed mood 07/11/2015  . Hemorrhoids 04/30/2014    Past Surgical History:  Procedure Laterality Date  . CESAREAN SECTION    . CESAREAN SECTION N/A 09/19/2016   Procedure: CESAREAN SECTION;  Surgeon: [place] Bing, MD;  Location: [facility]TH[facility] SUIT[facility];  Service: Obstetrics;  Laterality: N/A;  . implanon     inserted 2010  . INCISE AND DRAIN ABCESS  2019   under arms " boils"  . WISDOM TOOTH EXTRACTION      OB History    Gravida  2   Para  2   Term  2   Preterm      AB      Living  1     SAB      TAB      Ectopic      Multiple      Live Births  1            Home Medications    Prior to Admission medications   Medication Sig Start Date End Date Taking? Authorizing Provider  albuterol (PROVENTIL HFA;VENTOLIN HFA) 108 (90 Base) MCG/ACT inhaler Inhale 2 puffs into the lungs every 6 (six) hours as needed for wheezing or shortness of breath. 06/02/16   Degele, Kandra Nicolas, MD  sertraline (ZOLOFT) 50 MG tablet Take 1 tablet (50 mg total) by mouth daily. 10/26/16 10/26/17  Marylene Land, CNM    Family History Family History  Problem Relation Age of Onset  . Hypertension Mother   . Heart disease Mother        early 69s  . Diabetes Mother   . Diabetes Paternal Grandmother   . Stroke Paternal Grandmother     Social History Social History   Tobacco Use  . Smoking status: Former Smoker    Last attempt to quit: 03/20/2008    Years since quitting: 10.7  . Smokeless tobacco: Never Used  Substance Use Topics  . Alcohol use: No    Alcohol/week: 0.0 standard drinks  . Drug use: No     Allergies    Patient has no known allergies.   Review of Systems Review of Systems   Physical Exam Triage Vital Signs ED Triage Vitals  Enc Vitals Group     BP 12/25/18 1534 (!) 138/96     Pulse Rate 12/25/18 1534 94     Resp 12/25/18 1534 16     Temp 12/25/18 1534 98.4 F (36.9 C)     Temp Source 12/25/18 1534 Oral     SpO2 12/25/18 1534 98 %     Weight --      Height --      Head Circumference --      Peak Flow --      Pain Score 12/25/18 1546 0     Pain Loc --      Pain Edu? --      Excl. in GC? --    No data found.  Updated Vital Signs BP (!) 138/96 (BP Location: Right Wrist)   Pulse 94   Temp 98.4 F (36.9 C) (Oral)   Resp 16   SpO2 98%    Physical Exam Constitutional:      General: She is not in acute distress.    Appearance: She is well-developed.  HENT:     Nose:     Comments: No obvious source of bleeding    Mouth/Throat:     Mouth: Mucous membranes are moist.     Pharynx: Oropharynx is clear.  Cardiovascular:     Rate and Rhythm: Normal rate and regular rhythm.     Heart sounds: Normal heart sounds.  Pulmonary:     Effort: Pulmonary effort is normal.     Breath sounds: Normal breath sounds.  Abdominal:     Tenderness: There is abdominal tenderness. There is no guarding or rebound.     Comments: Generalized low abdominal tenderness on palpation   Skin:    General: Skin is warm and dry.  Neurological:     Mental Status: She is alert and oriented to person, place, and time.      UC Treatments / Results  Labs (all labs ordered are listed, but only abnormal results are displayed) Labs Reviewed - No data to display  EKG None  Radiology No results found.  Procedures Procedures (including critical care time)  Medications Ordered in UC Medications - No data to display  Initial Impression / Assessment and Plan / UC Course  I have reviewed the triage vital signs and the nursing notes.  Pertinent labs & imaging  results that were available during  my care of the patient were reviewed by me and considered in my medical decision making (see chart for details).     No tachycardia. Non distressed. No further bleeding episodes. Ulceration vs epistaxis vs gi bleed considered. Patient still with abdominal pain at this time. Recommend more thorough evaluation in the ER at this time. Patient verbalized understanding and agreeable to plan.  Safe for self transport to ER.  Final Clinical Impressions(s) / UC Diagnoses   Final diagnoses:  Lower abdominal pain  Hematemesis without nausea     Discharge Instructions     I feel you need more thorough evaluation of the symptoms you are experiencing.    ED Prescriptions    None     Controlled Substance Prescriptions [place] Controlled Substance Registry consulted? Not Applicable   Georgetta Haber, NP 12/25/18 1616

## 2018-12-25 NOTE — ED Provider Notes (Signed)
[facility] EMERGENCY DEPARTMENT Provider Note   CSN: [id number] Arrival date & time: 12/25/18  1614    History   Chief Complaint No chief complaint on file.   HPI Vanessa Foster is a 33 y.o. female.     The history is provided by the patient and medical records. No language interpreter was used.     33 year old female with history of anxiety, asthma, allergies, chronic nausea sent here from urgent care center for evaluation of abdominal pain.  Patient report this morning she woke up and she felt that she has a lot of saliva in her mouth.  She went to the bathroom and spit up a large amount of what appears to be bright red blood.  She quantified as approximately half a cup.  She then felt a bit nauseous.  Also report having some abdominal discomfort.  She describes her discomfort as a tightness sensation to her lower abdomen, persistent with occasional sharp pain.  Pain is 4 out of 10.  Did report some mild lightheadedness initially which is since resolved.  She is unsure if she coughs up blood or vomit up but states she has not coughed since.  She denies any associated fever chills.  Denies any chest pain, trouble breathing, back pain, dysuria.  Last bowel movement was yesterday and was normal.  States she ate steaks and drink Kool-Aid last night but did not recall eating anything red.  She denies any history of abnormal bleeding.  She denies any NSAID use or alcohol use.  She denies having sore throat.  No prior history of PE or DVT, no recent surgery or prolonged bedrest.  She does use Nexplanon.  Past Medical History:  Diagnosis Date  . Abnormal Pap smear of cervix    2011?  Marland Kitchen Allergy   . Anemia   . Anxiety   . Asthma    hospitalization 2008, last attack this month  . Chronic nausea   . Depression   . Dysmenorrhea   . Dyspareunia   . Gestational diabetes 07/13/2016   no meds  . Migraines   . PID (pelvic inflammatory disease)   . Wears glasses      Patient Active Problem List   Diagnosis Date Noted  . Yeast infection 01/06/2018  . S/P primary low transverse C-section 10/26/2016  . Normal postpartum course 10/26/2016  . Gestational diabetes mellitus 09/19/2016  . Anemia of mother in pregnancy, antepartum 07/29/2016  . Daytime somnolence 07/11/2015  . Sleep disorder 07/11/2015  . Cephalalgia 07/11/2015  . Asthma, mild intermittent 07/11/2015  . Well woman exam with routine gynecological exam 07/11/2015  . Depressed mood 07/11/2015  . Chronic nausea 07/11/2015  . Changing skin lesion 07/11/2015  . Adjustment disorder with mixed anxiety and depressed mood 07/11/2015  . Hemorrhoids 04/30/2014    Past Surgical History:  Procedure Laterality Date  . CESAREAN SECTION    . CESAREAN SECTION N/A 09/19/2016   Procedure: CESAREAN SECTION;  Surgeon: Fingal Bing, MD;  Location: [facility] B[facility]THING SUIT[facility];  Service: Obstetrics;  Laterality: N/A;  . implanon     inserted 2010  . INCISE AND DRAIN ABCESS  2019   under arms " boils"  . WISDOM TOOTH EXTRACTION       OB History    Gravida  2   Para  2   Term  2   Preterm      AB      Living  1     SAB  TAB      Ectopic      Multiple      Live Births  1            Home Medications    Prior to Admission medications   Medication Sig Start Date End Date Taking? Authorizing Provider  albuterol (PROVENTIL HFA;VENTOLIN HFA) 108 (90 Base) MCG/ACT inhaler Inhale 2 puffs into the lungs every 6 (six) hours as needed for wheezing or shortness of breath. 06/02/16   Degele, Kandra Nicolas, MD  sertraline (ZOLOFT) 50 MG tablet Take 1 tablet (50 mg total) by mouth daily. 10/26/16 10/26/17  Marylene Land, CNM    Family History Family History  Problem Relation Age of Onset  . Hypertension Mother   . Heart disease Mother        early 41s  . Diabetes Mother   . Diabetes Paternal Grandmother   . Stroke Paternal Grandmother     Social History Social History   Tobacco  Use  . Smoking status: Former Smoker    Last attempt to quit: 03/20/2008    Years since quitting: 10.7  . Smokeless tobacco: Never Used  Substance Use Topics  . Alcohol use: No    Alcohol/week: 0.0 standard drinks  . Drug use: No     Allergies   Patient has no known allergies.   Review of Systems Review of Systems  All other systems reviewed and are negative.    Physical Exam Updated Vital Signs There were no vitals taken for this visit.  Physical Exam Vitals signs and nursing note reviewed.  Constitutional:      General: She is not in acute distress.    Appearance: She is well-developed. She is obese.     Comments: Obese female resting comfortably in bed in no acute discomfort.  HENT:     Head: Atraumatic.     Mouth/Throat:     Comments: Normal oral mucosa, no bleeding gums Eyes:     Conjunctiva/sclera: Conjunctivae normal.  Neck:     Musculoskeletal: Neck supple.  Cardiovascular:     Rate and Rhythm: Normal rate and regular rhythm.     Pulses: Normal pulses.     Heart sounds: Normal heart sounds.  Pulmonary:     Effort: Pulmonary effort is normal.     Breath sounds: Normal breath sounds. No wheezing, rhonchi or rales.  Abdominal:     Palpations: Abdomen is soft.     Tenderness: There is abdominal tenderness (Very mild tenderness to suprapubic region without any guarding or rebound tenderness.  Negative Murphy sign, no pain at McBurney's point.).  Genitourinary:    Comments: Chaperone present during exam.  Normal rectal tone, normal stool color on glove, no mass, no obvious bleeding Skin:    Findings: No rash.  Neurological:     Mental Status: She is alert and oriented to person, place, and time.      ED Treatments / Results  Labs (all labs ordered are listed, but only abnormal results are displayed) Labs Reviewed  CBC WITH DIFFERENTIAL/PLATELET - Abnormal; Notable for the following components:      Result Value   MCH 24.9 (*)    All other components  within normal limits  URINALYSIS, ROUTINE W REFLEX MICROSCOPIC - Abnormal; Notable for the following components:   APPearance HAZY (*)    All other components within normal limits  COMPREHENSIVE METABOLIC PANEL  LIPASE, BLOOD  POC OCCULT BLOOD, ED  POC URINE PREG, ED  EKG None  Radiology No results found.  Procedures Procedures (including critical care time)  Medications Ordered in ED Medications - No data to display   Initial Impression / Assessment and Plan / ED Course  I have reviewed the triage vital signs and the nursing notes.  Pertinent labs & imaging results that were available during my care of the patient were reviewed by me and considered in my medical decision making (see chart for details).        BP (!) 141/120 (BP Location: Right Arm)   Pulse 98   Temp 98.4 F (36.9 C) (Oral)   Resp 18   SpO2 98%    Final Clinical Impressions(s) / ED Diagnoses   Final diagnoses:  Upper GI bleed    ED Discharge Orders    None     4:32 PM Patient report one episode of hematemesis early this morning and quantify as half a cup of bright red blood.  She also endorsing lower abdominal discomfort.  On exam she has a fairly benign abdomen.  No black tarry stool on rectal exam.  Her lungs are clear, no report of, shortness of breath.  Low suspicion for PE, pt is PERC negative.  No epigastric tenderness to suggest PUD or gastritis.  Patient initially sent here from urgent care for further evaluation of her symptom.  Workup initiated.   6:02 PM Normal WBC, normal H&H, UA without any signs of infection, electrolyte panels are reassuring, normal kidney function, normal lipase, Hemoccult test is negative and pregnancy test is negative.  Patient resting comfortably.  I offered pain medication but patient declined.  At this time in the setting of normal labs, no active bleeding, normal vital sign and patient with minimal pain, she will follow-up with GI specialist for further  evaluation of suspected upper GI bleed.  Strict return precaution discussed.   Fayrene Helper, PA-C 12/25/18 1806    Terrilee Files, MD 12/26/18 [phone]

## 2018-12-25 NOTE — ED Triage Notes (Signed)
Patient reports that she was coughing up blood last night, and today she is having lower  abdominal pain.

## 2018-12-25 NOTE — ED Notes (Signed)
ED Provider at bedside. 

## 2018-12-25 NOTE — ED Notes (Signed)
Patient verbalizes understanding of discharge instructions. Opportunity for questioning and answers were provided. Armband removed by staff, pt discharged from ED.  

## 2018-12-25 NOTE — Discharge Instructions (Addendum)
Please call and follow up with gastroenterologist at your earliest convenient for further evaluation of your bloody vomit.  Return if your symptoms worsen or if you have other concerns.

## 2018-12-25 NOTE — Discharge Instructions (Signed)
I feel you need more thorough evaluation of the symptoms you are experiencing.

## 2018-12-25 NOTE — ED Triage Notes (Signed)
Pt sts woke from sleep this morning bleeding from her mouth for unknown reason; pt sts stopped without intervention; pt sts some abd pain today

## 2018-12-26 LAB — POC URINE PREG, ED: Preg Test, Ur: NEGATIVE

## 2018-12-26 LAB — POC OCCULT BLOOD, ED: Fecal Occult Bld: NEGATIVE

## 2018-12-27 ENCOUNTER — Other Ambulatory Visit: Payer: Self-pay

## 2018-12-27 DIAGNOSIS — Z87891 Personal history of nicotine dependence: Secondary | ICD-10-CM | POA: Insufficient documentation

## 2018-12-27 DIAGNOSIS — Z79899 Other long term (current) drug therapy: Secondary | ICD-10-CM | POA: Insufficient documentation

## 2018-12-27 DIAGNOSIS — J45909 Unspecified asthma, uncomplicated: Secondary | ICD-10-CM | POA: Insufficient documentation

## 2018-12-27 DIAGNOSIS — R042 Hemoptysis: Secondary | ICD-10-CM | POA: Insufficient documentation

## 2018-12-27 DIAGNOSIS — R1030 Lower abdominal pain, unspecified: Secondary | ICD-10-CM | POA: Insufficient documentation

## 2018-12-27 NOTE — ED Triage Notes (Signed)
C/o generalized abd pain x 2 days. Pt states no relieving factors. N but denies V/D. No guarding or tenderness. Was seen at [facility] on Sunday and DC with referral to GI specialist. Pt reports coughing up blood on Sunday. Denies any urinary or bowel symptoms.

## 2018-12-28 ENCOUNTER — Emergency Department (HOSPITAL_COMMUNITY)
Admission: EM | Admit: 2018-12-28 | Discharge: 2018-12-28 | Disposition: A | Discharge status: Home / Self Care | Payer: Self-pay | Attending: Emergency Medicine | Admitting: Emergency Medicine

## 2018-12-28 ENCOUNTER — Emergency Department (HOSPITAL_COMMUNITY): Payer: Self-pay

## 2018-12-28 ENCOUNTER — Encounter (HOSPITAL_COMMUNITY): Payer: Self-pay

## 2018-12-28 DIAGNOSIS — R102 Pelvic and perineal pain: Secondary | ICD-10-CM

## 2018-12-28 DIAGNOSIS — R042 Hemoptysis: Secondary | ICD-10-CM

## 2018-12-28 LAB — COMPREHENSIVE METABOLIC PANEL
ALT: 17 U/L (ref 0–44)
AST: 17 U/L (ref 15–41)
Albumin: 3.8 g/dL (ref 3.5–5.0)
Alkaline Phosphatase: 48 U/L (ref 38–126)
Anion gap: 6 (ref 5–15)
BUN: 11 mg/dL (ref 6–20)
CO2: 23 mmol/L (ref 22–32)
Calcium: 9 mg/dL (ref 8.9–10.3)
Chloride: 107 mmol/L (ref 98–111)
Creatinine, Ser: 0.6 mg/dL (ref 0.44–1.00)
GFR calc Af Amer: >60 mL/min (ref >60)
GFR calc non Af Amer: >60 mL/min (ref >60)
Glucose, Bld: 106 mg/dL — ABNORMAL HIGH (ref 70–99)
Potassium: 3.5 mmol/L (ref 3.5–5.1)
Sodium: 136 mmol/L (ref 135–145)
Total Bilirubin: 0.5 mg/dL (ref 0.3–1.2)
Total Protein: 7.8 g/dL (ref 6.5–8.1)

## 2018-12-28 LAB — URINALYSIS, ROUTINE W REFLEX MICROSCOPIC
Bacteria, UA: NONE SEEN
Bilirubin Urine: NEGATIVE
Glucose, UA: NEGATIVE mg/dL
Hgb urine dipstick: NEGATIVE
Ketones, ur: NEGATIVE mg/dL
Leukocytes,Ua: NEGATIVE
Nitrite: NEGATIVE
Protein, ur: 30 mg/dL — AB
Specific Gravity, Urine: 1.026 (ref 1.005–1.030)
pH: 6 (ref 5.0–8.0)

## 2018-12-28 LAB — CBC
HCT: 38.8 % (ref 36.0–46.0)
Hemoglobin: 12.2 g/dL (ref 12.0–15.0)
MCH: 26.1 pg (ref 26.0–34.0)
MCHC: 31.4 g/dL (ref 30.0–36.0)
MCV: 82.9 fL (ref 80.0–100.0)
Platelets: 244 10*3/uL (ref 150–400)
RBC: 4.68 MIL/uL (ref 3.87–5.11)
RDW: 14.7 % (ref 11.5–15.5)
WBC: 10.1 10*3/uL (ref 4.0–10.5)
nRBC: 0 % (ref 0.0–0.2)

## 2018-12-28 LAB — I-STAT BETA HCG BLOOD, ED (MC, WL, AP ONLY): I-stat hCG, quantitative: <5 m[IU]/mL (ref <5)

## 2018-12-28 LAB — LIPASE, BLOOD: Lipase: 26 U/L (ref 11–51)

## 2018-12-28 MED ORDER — SODIUM CHLORIDE (PF) 0.9 % IJ SOLN
INTRAMUSCULAR | Status: AC
  Filled 2018-12-28: qty 50

## 2018-12-28 MED ORDER — IOHEXOL 350 MG/ML SOLN
100.0000 mL | Freq: Once | INTRAVENOUS | Status: AC | PRN
  Administered 2018-12-28: 02:00:00 100 mL via INTRAVENOUS

## 2018-12-28 MED ORDER — SODIUM CHLORIDE 0.9% FLUSH
3.0000 mL | Freq: Once | INTRAVENOUS | Status: AC
  Administered 2018-12-28: 02:00:00 3 mL via INTRAVENOUS

## 2018-12-28 NOTE — ED Notes (Signed)
Bed: WA13 Expected date:  Expected time:  Means of arrival:  Comments: Triage 1 

## 2018-12-28 NOTE — ED Provider Notes (Signed)
[facility]-EMERGENCY DEPT Provider Note  CSN: [id number] Arrival date & time: 12/27/18 2346  Chief Complaint(s) Abdominal Pain  HPI Vanessa Foster is a 33 y.o. female with a past medical history listed below who presents to the emergency department for hemoptysis and lower abdominal pain.  Patient reports that she had an episode of hemoptysis on Sunday and was evaluated by urgent care and sent to [facility].  At that time she reported lower abdominal discomfort which have been going on since Sunday as well.  Pain is been persistent but fluctuating with sharp pains.  No alleviating or aggravating factors.  She denies any associated nausea or vomiting.  No fevers or chills.  No chest pain or shortness of breath.  No urinary symptoms.  No vaginal discharge or dysuria.   Patient denies any prior history of PEs or DVTs.  No recent surgery or prolonged immobility.  Patient has Nexplanon.  Patient works at a nursing facility with sick contacts.  Patient denies any recent travel, TB exposure.  Denies any known COVID-19 exposure.  HPI  Past Medical History Past Medical History:  Diagnosis Date   Abnormal Pap smear of cervix    2011?   Allergy    Anemia    Anxiety    Asthma    hospitalization 2008, last attack this month   Chronic nausea    Depression    Dysmenorrhea    Dyspareunia    Gestational diabetes 07/13/2016   no meds   Migraines    PID (pelvic inflammatory disease)    Wears glasses    Patient Active Problem List   Diagnosis Date Noted   Yeast infection 01/06/2018   S/P primary low transverse C-section 10/26/2016   Normal postpartum course 10/26/2016   Gestational diabetes mellitus 09/19/2016   Anemia of mother in pregnancy, antepartum 07/29/2016   Daytime somnolence 07/11/2015   Sleep disorder 07/11/2015   Cephalalgia 07/11/2015   Asthma, mild intermittent 07/11/2015   Well woman exam with routine gynecological exam  07/11/2015   Depressed mood 07/11/2015   Chronic nausea 07/11/2015   Changing skin lesion 07/11/2015   Adjustment disorder with mixed anxiety and depressed mood 07/11/2015   Hemorrhoids 04/30/2014   Home Medication(s) Prior to Admission medications   Medication Sig Start Date End Date Taking? Authorizing Provider  cetirizine (ZYRTEC) 10 MG tablet Take 10 mg by mouth daily as needed (for seasonal allergies).   Yes [provider]                                                                                                                                    Past Surgical History Past Surgical History:  Procedure Laterality Date   CESAREAN SECTION     CESAREAN SECTION N/A 09/19/2016   Procedure: CESAREAN SECTION;  Surgeon: [place] Bing, MD;  Location: [facility];  Service: Obstetrics;  Laterality: N/A;   implanon  inserted 2010   INCISE AND DRAIN ABCESS  2019   under arms " boils"   WISDOM TOOTH EXTRACTION     Family History Family History  Problem Relation Age of Onset   Hypertension Mother    Heart disease Mother        early 440s   Diabetes Mother    Diabetes Paternal Grandmother    Stroke Paternal Grandmother     Social History Social History   Tobacco Use   Smoking status: Former Smoker    Last attempt to quit: 03/20/2008    Years since quitting: 10.7   Smokeless tobacco: Never Used  Substance Use Topics   Alcohol use: No    Alcohol/week: 0.0 standard drinks   Drug use: No   Allergies Patient has no known allergies.  Review of Systems Review of Systems All other systems are reviewed and are negative for acute change except as noted in the HPI  Physical Exam Vital Signs  I have reviewed the triage vital signs BP 128/85    Pulse 89    Temp 99.4 F (37.4 C) (Oral)    Resp 18    Ht 5\' 2"  (1.575 m)    Wt 99.8 kg    SpO2 100%    BMI 40.24 kg/m   Physical Exam Vitals signs reviewed.  Constitutional:      General:  She is not in acute distress.    Appearance: She is well-developed. She is not diaphoretic.  HENT:     Head: Normocephalic and atraumatic.     Nose: Nose normal.  Eyes:     General: No scleral icterus.       Right eye: No discharge.        Left eye: No discharge.     Conjunctiva/sclera: Conjunctivae normal.     Pupils: Pupils are equal, round, and reactive to light.  Neck:     Musculoskeletal: Normal range of motion and neck supple.  Cardiovascular:     Rate and Rhythm: Normal rate and regular rhythm.     Heart sounds: No murmur. No friction rub. No gallop.   Pulmonary:     Effort: Pulmonary effort is normal. No respiratory distress.     Breath sounds: Normal breath sounds. No stridor. No rales.  Abdominal:     General: There is no distension.     Palpations: Abdomen is soft.     Tenderness: There is no abdominal tenderness.  Genitourinary:    Comments: Patient declined pelvic exam Musculoskeletal:        General: No tenderness.  Skin:    General: Skin is warm and dry.     Findings: No erythema or rash.  Neurological:     Mental Status: She is alert and oriented to person, place, and time.     ED Results and Treatments Labs (all labs ordered are listed, but only abnormal results are displayed) Labs Reviewed  COMPREHENSIVE METABOLIC PANEL - Abnormal; Notable for the following components:      Result Value   Glucose, Bld 106 (*)    All other components within normal limits  URINALYSIS, ROUTINE W REFLEX MICROSCOPIC - Abnormal; Notable for the following components:   Protein, ur 30 (*)    All other components within normal limits  LIPASE, BLOOD  CBC  I-STAT BETA HCG BLOOD, ED ([facility], [facility], [facility] ONLY)  EKG  EKG Interpretation  Date/Time:    Ventricular Rate:    PR Interval:    QRS Duration:   QT Interval:    QTC Calculation:   R Axis:     Text  Interpretation:        Radiology Ct Angio Chest Pe W And/or Wo Contrast  Result Date: 12/28/2018 CLINICAL DATA:  Abdominal pain. Hemoptysis. Hemoptysis, persistent or recurring; Abd pain, appendicitis suspected EXAM: CT ANGIOGRAPHY CHEST CT ABDOMEN AND PELVIS WITH CONTRAST TECHNIQUE: Multidetector CT imaging of the chest was performed using the standard protocol during bolus administration of intravenous contrast. Multiplanar CT image reconstructions and MIPs were obtained to evaluate the vascular anatomy. Multidetector CT imaging of the abdomen and pelvis was performed using the standard protocol during bolus administration of intravenous contrast. CONTRAST:  OMNIPAQUE IOHEXOL 350 MG/ML SOLN COMPARISON:  Abdominal CT 12/23/2015 FINDINGS: CTA CHEST FINDINGS Cardiovascular: There are no filling defects within the pulmonary arteries to suggest pulmonary embolus. Normal caliber thoracic aorta without dissection. Heart is normal in size. No pericardial effusion. Mediastinum/Nodes: No enlarged mediastinal or hilar lymph nodes. Visualized thyroid gland is unremarkable. Esophagus is decompressed. No mediastinal mass. Few prominent bilateral axillary nodes are likely reactive. Lungs/Pleura: Breathing motion artifact through the lung bases. No focal consolidation, pulmonary edema, or pleural fluid. Musculoskeletal: There are no acute or suspicious osseous abnormalities. Review of the MIP images confirms the above findings. CT ABDOMEN and PELVIS FINDINGS Hepatobiliary: Hepatomegaly with elongated right lobe of the liver spanning 22 cm cranial caudal. No focal abnormality. Gallbladder partially distended. No calcified gallstone. No biliary dilatation. Pancreas: No ductal dilatation or inflammation. Spleen: Normal in size without focal abnormality. Adrenals/Urinary Tract: Normal adrenal glands. No hydronephrosis or perinephric edema. Homogeneous renal enhancement. Urinary bladder is physiologically distended without  wall thickening. Stomach/Bowel: Normal appendix, best appreciated on coronal image 51. Stomach physiologically distended. No small bowel wall thickening, inflammatory change, or obstruction. Small to moderate colonic stool burden. No colonic inflammation. Vascular/Lymphatic: Normal caliber abdominal aorta. Portal vein is patent. No acute vascular findings. No enlarged abdominal or pelvic lymph nodes. Reproductive: 3.7 cm cyst in the right ovary measures simple fluid density and is considered benign. Uterus and left adnexa are unremarkable. Other: No free air, free fluid, or intra-abdominal fluid collection. Musculoskeletal: Mild rectus diastasis. There are no acute or suspicious osseous abnormalities. Mild sclerosis about the iliac aspect of the sacroiliac joints suggesting osteitis condensans iliac. Review of the MIP images confirms the above findings. IMPRESSION: 1. No pulmonary embolus.  No acute intrathoracic abnormality. 2. Normal appendix. No acute abnormality abdomen or pelvis. 3. Hepatomegaly. 4. A 3.7 cm cyst in the right ovary is considered benign. No dedicated further imaging is needed. Electronically Signed   By: Narda Rutherford M.D.   On: 12/28/2018 03:07   Ct Abdomen Pelvis W Contrast  Result Date: 12/28/2018 CLINICAL DATA:  Abdominal pain. Hemoptysis. Hemoptysis, persistent or recurring; Abd pain, appendicitis suspected EXAM: CT ANGIOGRAPHY CHEST CT ABDOMEN AND PELVIS WITH CONTRAST TECHNIQUE: Multidetector CT imaging of the chest was performed using the standard protocol during bolus administration of intravenous contrast. Multiplanar CT image reconstructions and MIPs were obtained to evaluate the vascular anatomy. Multidetector CT imaging of the abdomen and pelvis was performed using the standard protocol during bolus administration of intravenous contrast. CONTRAST:  OMNIPAQUE IOHEXOL 350 MG/ML SOLN COMPARISON:  Abdominal CT 12/23/2015 FINDINGS: CTA CHEST FINDINGS Cardiovascular: There are  no filling defects within the pulmonary arteries to suggest pulmonary embolus. Normal caliber  thoracic aorta without dissection. Heart is normal in size. No pericardial effusion. Mediastinum/Nodes: No enlarged mediastinal or hilar lymph nodes. Visualized thyroid gland is unremarkable. Esophagus is decompressed. No mediastinal mass. Few prominent bilateral axillary nodes are likely reactive. Lungs/Pleura: Breathing motion artifact through the lung bases. No focal consolidation, pulmonary edema, or pleural fluid. Musculoskeletal: There are no acute or suspicious osseous abnormalities. Review of the MIP images confirms the above findings. CT ABDOMEN and PELVIS FINDINGS Hepatobiliary: Hepatomegaly with elongated right lobe of the liver spanning 22 cm cranial caudal. No focal abnormality. Gallbladder partially distended. No calcified gallstone. No biliary dilatation. Pancreas: No ductal dilatation or inflammation. Spleen: Normal in size without focal abnormality. Adrenals/Urinary Tract: Normal adrenal glands. No hydronephrosis or perinephric edema. Homogeneous renal enhancement. Urinary bladder is physiologically distended without wall thickening. Stomach/Bowel: Normal appendix, best appreciated on coronal image 51. Stomach physiologically distended. No small bowel wall thickening, inflammatory change, or obstruction. Small to moderate colonic stool burden. No colonic inflammation. Vascular/Lymphatic: Normal caliber abdominal aorta. Portal vein is patent. No acute vascular findings. No enlarged abdominal or pelvic lymph nodes. Reproductive: 3.7 cm cyst in the right ovary measures simple fluid density and is considered benign. Uterus and left adnexa are unremarkable. Other: No free air, free fluid, or intra-abdominal fluid collection. Musculoskeletal: Mild rectus diastasis. There are no acute or suspicious osseous abnormalities. Mild sclerosis about the iliac aspect of the sacroiliac joints suggesting osteitis condensans  iliac. Review of the MIP images confirms the above findings. IMPRESSION: 1. No pulmonary embolus.  No acute intrathoracic abnormality. 2. Normal appendix. No acute abnormality abdomen or pelvis. 3. Hepatomegaly. 4. A 3.7 cm cyst in the right ovary is considered benign. No dedicated further imaging is needed. Electronically Signed   By: Narda Rutherford M.D.   On: 12/28/2018 03:07   Pertinent labs & imaging results that were available during my care of the patient were reviewed by me and considered in my medical decision making (see chart for details).  Medications Ordered in ED Medications  sodium chloride flush (NS) 0.9 % injection 3 mL (3 mLs Intravenous Given 12/28/18 0141)  iohexol (OMNIPAQUE) 350 MG/ML injection 100 mL (100 mLs Intravenous Contrast Given 12/28/18 0219)                                                                                                                                    Procedures Procedures  (including critical care time)  Medical Decision Making / ED Course I have reviewed the nursing notes for this encounter and the patient's prior records (if available in EHR or on provided paperwork).    1.  Hemoptysis Patient had a large clot approximately 15 to 20 cc in volume here in the emergency department.  Lungs clear to auscultation.  Patient does have a low-grade temp but otherwise vitals are reassuring.    Labs reassuring without leukocytosis or anemia.  CT chest was obtained and ruled  out pulmonary embolism, pneumonia, mass or cavitary lesion.  2. Lower abd pain. Patient with 4 days of abdominal discomfort.  Abdominal exam benign.  Patient declined pelvic exam.  hCG negative.  Rest of the labs reassuring without leukocytosis.  No significant electrolyte derangements or renal insufficiency.  No evidence of bili obstruction or pancreatitis.  UA without evidence of infection.  Low suspicion for serious intra-abdominal Fama to assess infectious process requiring  further imaging at this time.  The patient appears reasonably screened and/or stabilized for discharge and I doubt any other medical condition or other [facility] requiring further screening, evaluation, or treatment in the ED at this time prior to discharge.  The patient is safe for discharge with strict return precautions.   Final Clinical Impression(s) / ED Diagnoses Final diagnoses:  Hemoptysis  Suprapubic discomfort   Disposition: Discharge  Condition: Good  I have discussed the results, Dx and Tx plan with the patient who expressed understanding and agree(s) with the plan. Discharge instructions discussed at great length. The patient was given strict return precautions who verbalized understanding of the instructions. No further questions at time of discharge.    ED Discharge Orders    None       Follow Up: Primary care provider  Schedule an appointment as soon as possible for a visit  If you do not have a primary care physician, contact [facility] at [phone] for referral      This chart was dictated using voice recognition software.  Despite best efforts to proofread,  errors can occur which can change the documentation meaning.   Nira Conn, MD 12/28/18 [id number]

## 2018-12-31 ENCOUNTER — Emergency Department (HOSPITAL_COMMUNITY)
Admission: EM | Admit: 2018-12-31 | Discharge: 2018-12-31 | Disposition: A | Discharge status: Home / Self Care | Payer: Self-pay | Attending: Emergency Medicine | Admitting: Emergency Medicine

## 2018-12-31 ENCOUNTER — Encounter (HOSPITAL_COMMUNITY): Payer: Self-pay | Admitting: *Deleted

## 2018-12-31 ENCOUNTER — Other Ambulatory Visit: Payer: Self-pay

## 2018-12-31 DIAGNOSIS — K920 Hematemesis: Secondary | ICD-10-CM | POA: Insufficient documentation

## 2018-12-31 DIAGNOSIS — R1030 Lower abdominal pain, unspecified: Secondary | ICD-10-CM | POA: Insufficient documentation

## 2018-12-31 DIAGNOSIS — R110 Nausea: Secondary | ICD-10-CM | POA: Insufficient documentation

## 2018-12-31 DIAGNOSIS — Z8709 Personal history of other diseases of the respiratory system: Secondary | ICD-10-CM | POA: Insufficient documentation

## 2018-12-31 DIAGNOSIS — Z87891 Personal history of nicotine dependence: Secondary | ICD-10-CM | POA: Insufficient documentation

## 2018-12-31 LAB — I-STAT BETA HCG BLOOD, ED (MC, WL, AP ONLY): I-stat hCG, quantitative: <5 m[IU]/mL (ref <5)

## 2018-12-31 LAB — CBC WITH DIFFERENTIAL/PLATELET
Abs Immature Granulocytes: 0.06 10*3/uL (ref 0.00–0.07)
Basophils Absolute: 0.1 10*3/uL (ref 0.0–0.1)
Basophils Relative: 1 %
Eosinophils Absolute: 0.3 10*3/uL (ref 0.0–0.5)
Eosinophils Relative: 3 %
HCT: 40.1 % (ref 36.0–46.0)
Hemoglobin: 12.3 g/dL (ref 12.0–15.0)
Immature Granulocytes: 1 %
Lymphocytes Relative: 27 %
Lymphs Abs: 2.2 10*3/uL (ref 0.7–4.0)
MCH: 24.8 pg — ABNORMAL LOW (ref 26.0–34.0)
MCHC: 30.7 g/dL (ref 30.0–36.0)
MCV: 81 fL (ref 80.0–100.0)
Monocytes Absolute: 0.5 10*3/uL (ref 0.1–1.0)
Monocytes Relative: 7 %
Neutro Abs: 4.9 10*3/uL (ref 1.7–7.7)
Neutrophils Relative %: 61 %
Platelets: 263 10*3/uL (ref 150–400)
RBC: 4.95 MIL/uL (ref 3.87–5.11)
RDW: 14.3 % (ref 11.5–15.5)
WBC: 8 10*3/uL (ref 4.0–10.5)
nRBC: 0 % (ref 0.0–0.2)

## 2018-12-31 LAB — PROTIME-INR
INR: 1 (ref 0.8–1.2)
Prothrombin Time: 13.6 seconds (ref 11.4–15.2)

## 2018-12-31 LAB — POC OCCULT BLOOD, ED: Fecal Occult Bld: NEGATIVE

## 2018-12-31 LAB — COMPREHENSIVE METABOLIC PANEL
ALT: 15 U/L (ref 0–44)
AST: 20 U/L (ref 15–41)
Albumin: 3.7 g/dL (ref 3.5–5.0)
Alkaline Phosphatase: 51 U/L (ref 38–126)
Anion gap: 10 (ref 5–15)
BUN: 10 mg/dL (ref 6–20)
CO2: 19 mmol/L — ABNORMAL LOW (ref 22–32)
Calcium: 9 mg/dL (ref 8.9–10.3)
Chloride: 107 mmol/L (ref 98–111)
Creatinine, Ser: 0.58 mg/dL (ref 0.44–1.00)
GFR calc Af Amer: >60 mL/min (ref >60)
GFR calc non Af Amer: >60 mL/min (ref >60)
Glucose, Bld: 97 mg/dL (ref 70–99)
Potassium: 4.1 mmol/L (ref 3.5–5.1)
Sodium: 136 mmol/L (ref 135–145)
Total Bilirubin: 0.8 mg/dL (ref 0.3–1.2)
Total Protein: 7.2 g/dL (ref 6.5–8.1)

## 2018-12-31 LAB — APTT: aPTT: 27 seconds (ref 24–36)

## 2018-12-31 MED ORDER — ONDANSETRON 4 MG PO TBDP: 4.0000 mg | ORAL_TABLET | Freq: Four times a day (QID) | ORAL | 0 refills | Status: AC

## 2018-12-31 MED ORDER — SUCRALFATE 1 G PO TABS: 1.0000 g | ORAL_TABLET | Freq: Three times a day (TID) | ORAL | 0 refills | Status: DC

## 2018-12-31 MED ORDER — PANTOPRAZOLE SODIUM 40 MG IV SOLR
40.0000 mg | Freq: Once | INTRAVENOUS | Status: AC
  Administered 2018-12-31: 16:00:00 40 mg via INTRAVENOUS
  Filled 2018-12-31: qty 40

## 2018-12-31 MED ORDER — PANTOPRAZOLE SODIUM 40 MG PO TBEC: 40.0000 mg | DELAYED_RELEASE_TABLET | Freq: Every day | ORAL | 0 refills | Status: DC

## 2018-12-31 NOTE — ED Triage Notes (Signed)
PT reports she has been vomiting blood since last Sunday.

## 2018-12-31 NOTE — Discharge Instructions (Addendum)
I have given you 3 prescriptions today.  Zofran is to help with nausea.  For the next 4 days please take this every 6 hours, even if you do not feel nauseous.  After that you may use it as needed.  Carafate is a medicine to help coat your stomach.  I have given you a prescription for Protonix which is a medicine to help reduce the amount of acid in your stomach.  If you are unable to afford this you may use Prilosec which is available over-the-counter.

## 2018-12-31 NOTE — ED Provider Notes (Addendum)
[facility] EMERGENCY DEPARTMENT Provider Note   CSN: [id number] Arrival date & time: 12/31/18  1445    History   Chief Complaint Chief Complaint  Patient presents with   Hematemesis    HPI Vanessa Foster is a 33 y.o. female with a past medical history of asthma, anemia, who presents today for evaluation for the third time in 6 days of hematemesis.  She reports that she continues to throw up blood every day.  She denies any history of this.  Her lower abdomen has hurt however she says it is like a 2 out of 10.  She denies any dark tarry sticky bowel movements or blood in her bowel movements.  She denies any ingestion of foods with red dye.  She reports that she so far today has vomited twice approximately one quarter of a cup of bright red blood.    Chart review shows that she has been evaluated with labs.  On her last visit here she had a negative fecal occult blood, and she had CTA PE study of her chest with CT with contrast abdomen pelvis which did not show any evidence of cavitary lesions, TB, PE, or other cause for hemoptysis.  She is certain that this is vomiting blood not coughing it up.    She reports continued lower abdominal pain, 2 out of 10 which has been unchanged.     HPI  Past Medical History:  Diagnosis Date   Abnormal Pap smear of cervix    2011?   Allergy    Anemia    Anxiety    Asthma    hospitalization 2008, last attack this month   Chronic nausea    Depression    Dysmenorrhea    Dyspareunia    Gestational diabetes 07/13/2016   no meds   Migraines    PID (pelvic inflammatory disease)    Wears glasses     Patient Active Problem List   Diagnosis Date Noted   Yeast infection 01/06/2018   S/P primary low transverse C-section 10/26/2016   Normal postpartum course 10/26/2016   Gestational diabetes mellitus 09/19/2016   Anemia of mother in pregnancy, antepartum 07/29/2016   Daytime somnolence 07/11/2015    Sleep disorder 07/11/2015   Cephalalgia 07/11/2015   Asthma, mild intermittent 07/11/2015   Well woman exam with routine gynecological exam 07/11/2015   Depressed mood 07/11/2015   Chronic nausea 07/11/2015   Changing skin lesion 07/11/2015   Adjustment disorder with mixed anxiety and depressed mood 07/11/2015   Hemorrhoids 04/30/2014    Past Surgical History:  Procedure Laterality Date   CESAREAN SECTION     CESAREAN SECTION N/A 09/19/2016   Procedure: CESAREAN SECTION;  Surgeon: [place] Bing, MD;  Location: [facility];  Service: Obstetrics;  Laterality: N/A;   implanon     inserted 2010   INCISE AND DRAIN ABCESS  2019   under arms " boils"   WISDOM TOOTH EXTRACTION       OB History    Gravida  2   Para  2   Term  2   Preterm      AB      Living  1     SAB      TAB      Ectopic      Multiple      Live Births  1            Home Medications    Prior to Admission medications  Medication Sig Start Date End Date Taking? Authorizing Provider  cetirizine (ZYRTEC) 10 MG tablet Take 10 mg by mouth daily as needed (for seasonal allergies).    [provider]  ondansetron (ZOFRAN ODT) 4 MG disintegrating tablet Take 1 tablet (4 mg total) by mouth every 6 (six) hours for 4 days. After 4 days may take as needed every 6 hours. 12/31/18 01/04/19  Cristina GongHammond, Nataleigh Griffin W, PA-C  pantoprazole (PROTONIX) 40 MG tablet Take 1 tablet (40 mg total) by mouth daily for 14 days. 12/31/18 01/14/19  Cristina GongHammond, Illianna Paschal W, PA-C  sucralfate (CARAFATE) 1 g tablet Take 1 tablet (1 g total) by mouth 4 (four) times daily -  with meals and at bedtime for 14 days. 12/31/18 01/14/19  Cristina GongHammond, Denis Koppel W, PA-C    Family History Family History  Problem Relation Age of Onset   Hypertension Mother    Heart disease Mother        early 5840s   Diabetes Mother    Diabetes Paternal Grandmother    Stroke Paternal Grandmother     Social History Social History     Tobacco Use   Smoking status: Former Smoker    Last attempt to quit: 03/20/2008    Years since quitting: 10.7   Smokeless tobacco: Never Used  Substance Use Topics   Alcohol use: No    Alcohol/week: 0.0 standard drinks   Drug use: No     Allergies   Patient has no known allergies.   Review of Systems Review of Systems  Constitutional: Negative for activity change and fever.  HENT: Negative for congestion, mouth sores, nosebleeds, postnasal drip and rhinorrhea.   Eyes: Negative for visual disturbance.  Respiratory: Negative for choking and shortness of breath.   Cardiovascular: Negative for chest pain and palpitations.  Gastrointestinal: Positive for abdominal pain, nausea and vomiting. Negative for constipation and diarrhea.  Genitourinary: Negative for dysuria.  Musculoskeletal: Negative for back pain and neck pain.  Skin: Negative for color change.  Neurological: Negative for weakness and light-headedness.  Hematological: Does not bruise/bleed easily.  All other systems reviewed and are negative.    Physical Exam Updated Vital Signs BP 119/86 (BP Location: Right Arm)    Pulse 92    Temp 98.6 F (37 C) (Oral)    Resp 16    Ht 5\' 2"  (1.575 m)    Wt 99.8 kg    SpO2 100%    BMI 40.24 kg/m   Physical Exam Vitals signs and nursing note reviewed. Exam conducted with a chaperone present Systems analyst(Abigail tech).  Constitutional:      General: She is not in acute distress.    Appearance: She is well-developed. She is not ill-appearing.  HENT:     Head: Normocephalic and atraumatic.     Nose: Nose normal.     Comments: No evidence of epistaxis    Mouth/Throat:     Comments: No postnasal drip appreciated. Eyes:     Conjunctiva/sclera: Conjunctivae normal.  Neck:     Musculoskeletal: Normal range of motion and neck supple.  Cardiovascular:     Rate and Rhythm: Normal rate and regular rhythm.     Heart sounds: No murmur.  Pulmonary:     Effort: Pulmonary effort is normal.  No respiratory distress.     Breath sounds: Normal breath sounds.  Abdominal:     General: Abdomen is flat. There is no distension.     Palpations: Abdomen is soft. There is no mass.  Tenderness: There is no abdominal tenderness.     Hernia: No hernia is present.  Genitourinary:    Comments: No external hemorrhoids palpated.  There is a small amount of hard stool palpable with the tip of my finger.  No frank melana. Skin:    General: Skin is warm and dry.  Neurological:     General: No focal deficit present.     Mental Status: She is alert.  Psychiatric:        Mood and Affect: Mood normal.      ED Treatments / Results  Labs (all labs ordered are listed, but only abnormal results are displayed) Labs Reviewed  CBC WITH DIFFERENTIAL/PLATELET - Abnormal; Notable for the following components:      Result Value   MCH 24.8 (*)    All other components within normal limits  COMPREHENSIVE METABOLIC PANEL - Abnormal; Notable for the following components:   CO2 19 (*)    All other components within normal limits  PROTIME-INR  APTT  I-STAT BETA HCG BLOOD, ED ([facility], [facility], [facility] ONLY)  POC OCCULT BLOOD, ED    EKG EKG Interpretation  Date/Time:  Saturday December 31 2018 16:52:44 EDT Ventricular Rate:  83 PR Interval:  138 QRS Duration: 86 QT Interval:  376 QTC Calculation: 441 R Axis:   79 Text Interpretation:  Normal sinus rhythm Normal ECG Confirmed by Tilden Fossa [phone]) on 12/31/2018 5:58:54 PM   Radiology No results found.  Procedures Procedures (including critical care time)  Medications Ordered in ED Medications  pantoprazole (PROTONIX) injection 40 mg (40 mg Intravenous Given 12/31/18 1603)     Initial Impression / Assessment and Plan / ED Course  I have reviewed the triage vital signs and the nursing notes.  Pertinent labs & imaging results that were available during my care of the patient were reviewed by me and considered in my medical decision making (see  chart for details).  Clinical Course as of Dec 31 2030  Sat Dec 31, 2018  1547 Rectal exam performed with chaperone in room.  There is a small amount of hard stool palpable at the tip of my finger,unable to obtain significant amount of stool for sample.  Will send fecal occult blood but if negative suspect may be false negative.    [EH]  1720 Was asked by RN to locate patient.  She has a small intraoral hematoma near her lip ring.  She tells me that she removed her lip ring earlier and it was oozing blood.  This happened after she had already been having hematemesis.  No active bleeding on my exam.  GI consult placed.   [EH]  1721 Patient is not anemic.  Hemoglobin is essentially unchanged since her visit 3 days ago.  Hemoglobin: 12.3 [EH]    Clinical Course User Index [EH] Cristina Gong, PA-C   Orthostatic VS for the past 24 hrs:  BP- Lying Pulse- Lying BP- Sitting Pulse- Sitting BP- Standing at 0 minutes Pulse- Standing at 0 minutes  12/31/18 1621 (!) 117/95 85 (!) 125/98 92 (!) 123/92 90       Patient presents today for concern of vomiting up blood.  This is her third ED visit for this in the past 6 days.  She tells me that multiple times a day, so far to today, she has vomited approximately 1/4 cup of bright red blood.  Chart review shows that she has had extensive work-up thus far including CTA PE study without evidence of PE  or acute abnormality, CT abdomen pelvis without abnormality, labs, and was given GI follow-up.  Patient has not followed up with GI and cites financial reasons.  Here today she is not significantly tachycardic, or hypotensive.  Her hemoglobin is 12.3 which is a slight improvement over her hemoglobin of 12.2 3 days ago.  She is not orthostatic.  Her BUN is not elevated, as I would suspect that it would be if she was having active GI bleeding.  She does not have any significant electrolyte derangements.  PT/INR and APTT are both normal.  I spoke with on-call for  GI Dr. Marina Goodell given that this is her third visit.  She did not have any hematemesis while in the emergency room.  She has not yet tried any conservative care.  Her labs, including her not being anemic, her not being tachycardic, or orthostatic do not appear to correlate with reported volume of hematemesis, suspect that she is significantly overestimating the volume of blood.  We will treat her for gastritis/gastric ulcer as this is the most likely cause of her hematemesis.  She is given prescriptions for Carafate, and Protonix along with Protonix IV here in the emergency room.  She is given prescription for Zofran with instructions to take it continually for the next 4 days, even if she is not nauseous to help calm down any inflammation from gastritis.  Return precautions were discussed with patient who states their understanding.  At the time of discharge patient denied any unaddressed complaints or concerns.  Patient is agreeable for discharge home.   Final Clinical Impressions(s) / ED Diagnoses   Final diagnoses:  Hematemesis with nausea    ED Discharge Orders         Ordered    ondansetron (ZOFRAN ODT) 4 MG disintegrating tablet  Every 6 hours     12/31/18 1826    sucralfate (CARAFATE) 1 g tablet  3 times daily with meals & bedtime     12/31/18 1826    pantoprazole (PROTONIX) 40 MG tablet  Daily     12/31/18 1826           Norman Clay 12/31/18 2034    Tilden Fossa, MD 01/01/19 1209   Addendum: Had incorrectly listed Dr. Rhea Belton as the GI doctor on call who I spoke with.  This was an accidental substitution by Dragon and should say that I spoke with Dr. Marina Goodell from GI, not Dr. Rhea Belton.      Norman Clay 01/05/19 Elbert Ewings, MD 01/09/19 1128

## 2018-12-31 NOTE — ED Notes (Signed)
Patient verbalizes understanding of discharge instructions . Opportunity for questions and answers were provided . Armband removed by staff ,Pt discharged from ED. W/C  offered at D/C  and Declined W/C at D/C and was escorted to lobby by RN.  

## 2019-03-01 ENCOUNTER — Encounter: Payer: Self-pay | Admitting: *Deleted

## 2019-03-19 ENCOUNTER — Telehealth: Payer: Self-pay | Admitting: Family

## 2019-03-19 DIAGNOSIS — U071 COVID-19: Secondary | ICD-10-CM

## 2019-03-19 MED ORDER — BENZONATATE 100 MG PO CAPS: 100.0000 mg | ORAL_CAPSULE | Freq: Three times a day (TID) | ORAL | 0 refills | Status: DC | PRN

## 2019-03-19 MED ORDER — ALBUTEROL SULFATE HFA 108 (90 BASE) MCG/ACT IN AERS: 2.0000 | INHALATION_SPRAY | Freq: Four times a day (QID) | RESPIRATORY_TRACT | 0 refills | Status: DC | PRN

## 2019-03-19 NOTE — Progress Notes (Signed)
E-Visit for [employer] Virus Screening   Based on your symptoms and since you have tested positive for Covid, you need to quarantine yourself for 14 days, force fluids, avoid anybody over the age of 47 or older, and Tylenol as needed. If you become short of breath or your symptoms worsen you need to go to the ED room.   Approximately 5 minutes was spent documenting and reviewing patient's chart.    COVID-19 is a respiratory illness with symptoms that are similar to the flu. Symptoms are typically mild to moderate, but there have been cases of severe illness and death due to the virus. The following symptoms may appear 2-14 days after exposure: . Fever . Cough . Shortness of breath or difficulty breathing . Chills . Repeated shaking with chills . Muscle pain . Headache . Sore throat . New loss of taste or smell . Fatigue . Congestion or runny nose . Nausea or vomiting . Diarrhea  It is vitally important that if you feel that you have an infection such as this virus or any other virus that you stay home and away from places where you may spread it to others.  You should self-quarantine for 14 days if you have symptoms that could potentially be coronavirus or have been in close contact a with a person diagnosed with COVID-19 within the last 2 weeks. You should avoid contact with people age 37 and older.   You should wear a mask or cloth face covering over your nose and mouth if you must be around other people or animals, including pets (even at home). Try to stay at least 6 feet away from other people. This will protect the people around you.  You can use medication such as A prescription cough medication called Tessalon Perles 100 mg. You may take 1-2 capsules every 8 hours as needed for cough and A prescription inhaler called Albuterol MDI 90 mcg /actuation 2 puffs every 4 hours as needed for shortness of breath, wheezing, cough  You may also take acetaminophen (Tylenol) as needed for  fever.   Reduce your risk of any infection by using the same precautions used for avoiding the common cold or flu:  Marland Kitchen Wash your hands often with soap and warm water for at least 20 seconds.  If soap and water are not readily available, use an alcohol-based hand sanitizer with at least 60% alcohol.  . If coughing or sneezing, cover your mouth and nose by coughing or sneezing into the elbow areas of your shirt or coat, into a tissue or into your sleeve (not your hands). . Avoid shaking hands with others and consider head nods or verbal greetings only. . Avoid touching your eyes, nose, or mouth with unwashed hands.  . Avoid close contact with people who are sick. . Avoid places or events with large numbers of people in one location, like concerts or sporting events. . Carefully consider travel plans you have or are making. . If you are planning any travel outside or inside the [place], visit the CDC's Travelers' Health webpage for the latest health notices. . If you have some symptoms but not all symptoms, continue to monitor at home and seek medical attention if your symptoms worsen. . If you are having a medical emergency, call 911.  HOME CARE . Only take medications as instructed by your medical team. . Drink plenty of fluids and get plenty of rest. . A steam or ultrasonic humidifier can help if you have congestion.  GET HELP RIGHT AWAY IF YOU HAVE EMERGENCY WARNING SIGNS** FOR COVID-19. If you or someone is showing any of these signs seek emergency medical care immediately. Call 911 or proceed to your closest emergency facility if: . You develop worsening high fever. . Trouble breathing . Bluish lips or face . Persistent pain or pressure in the chest . New confusion . Inability to wake or stay awake . You cough up blood. . Your symptoms become more severe  **This list is not all possible symptoms. Contact your medical provider for any symptoms that are sever or concerning to you.   MAKE  SURE YOU   Understand these instructions.  Will watch your condition.  Will get help right away if you are not doing well or get worse.  Your e-visit answers were reviewed by a board certified advanced clinical practitioner to complete your personal care plan.  Depending on the condition, your plan could have included both over the counter or prescription medications.  If there is a problem please reply once you have received a response from your provider.  Your safety is important to us.  If you have drug allergies check your prescription carefully.    You can use MyChart to ask questions about today's visit, request a non-urgent call back, or ask for a work or school excuse for 24 hours related to this e-Visit. If it has been greater than 24 hours you will need to follow up with your provider, or enter a new e-Visit to address those concerns. You will get an e-mail in the next two days asking about your experience.  I hope that your e-visit has been valuable and will speed your recovery. Thank you for using e-visits.

## 2019-05-19 ENCOUNTER — Emergency Department (HOSPITAL_COMMUNITY)
Admission: EM | Admit: 2019-05-19 | Discharge: 2019-05-19 | Disposition: A | Discharge status: Home / Self Care | Payer: Self-pay | Attending: Emergency Medicine | Admitting: Emergency Medicine

## 2019-05-19 DIAGNOSIS — Z87891 Personal history of nicotine dependence: Secondary | ICD-10-CM | POA: Insufficient documentation

## 2019-05-19 DIAGNOSIS — J45909 Unspecified asthma, uncomplicated: Secondary | ICD-10-CM | POA: Insufficient documentation

## 2019-05-19 DIAGNOSIS — Z79899 Other long term (current) drug therapy: Secondary | ICD-10-CM | POA: Insufficient documentation

## 2019-05-19 DIAGNOSIS — K0889 Other specified disorders of teeth and supporting structures: Secondary | ICD-10-CM | POA: Insufficient documentation

## 2019-05-19 MED ORDER — ONDANSETRON HCL 4 MG PO TABS
4.0000 mg | ORAL_TABLET | Freq: Once | ORAL | Status: AC
  Administered 2019-05-19: 12:00:00 4 mg via ORAL
  Filled 2019-05-19: qty 1

## 2019-05-19 MED ORDER — OXYCODONE-ACETAMINOPHEN 5-325 MG PO TABS
1.0000 | ORAL_TABLET | Freq: Once | ORAL | Status: AC
  Administered 2019-05-19: 1 via ORAL
  Filled 2019-05-19: qty 1

## 2019-05-19 MED ORDER — CHLORHEXIDINE GLUCONATE 0.12% ORAL RINSE (MEDLINE KIT): 15.0000 mL | Freq: Two times a day (BID) | OROMUCOSAL | 0 refills | Status: DC

## 2019-05-19 MED ORDER — AMOXICILLIN-POT CLAVULANATE 875-125 MG PO TABS: 1.0000 | ORAL_TABLET | Freq: Two times a day (BID) | ORAL | 0 refills | Status: AC

## 2019-05-19 NOTE — ED Triage Notes (Signed)
Patient c/o dental pain to left side when she eats/drinks. States it has been bothering her for a while but worse over the last week, thinks she may have an exposed nerve.

## 2019-05-19 NOTE — ED Provider Notes (Signed)
[place] EMERGENCY DEPARTMENT Provider Note   CSN: [id number] Arrival date & time: 05/19/19  0957     History   Chief Complaint Chief Complaint  Patient presents with  . Dental Pain    HPI Vanessa Foster is a 33 y.o. female past medical history significant for anemia, asthma, PID, migraines presents emergency department today with chief complaint of left-sided dental pain.  Patient is been going on for several months however has worsened over the last week.  She has a known broken upper left molar.  She states the pain is throbbing localized to the broken tooth.  Pain is worse when she tries to eat or drink.  She has been taking Tylenol without symptom relief.  She was supposed to have dental surgery 2 years ago however was not able to have it done because she lost her insurance.  She has not been to dentist since then. Denies fever, chills, voice change, inability to control secretions, nausea/vomiting, facial swelling, dysphagia, odynophagia, drainage or trauma. History provided by patient with additional history obtained from chart review.        Past Medical History:  Diagnosis Date  . Abnormal Pap smear of cervix    2011?  Marland Kitchen Allergy   . Anemia   . Anxiety   . Asthma    hospitalization 2008, last attack this month  . Chronic nausea   . Depression   . Dysmenorrhea   . Dyspareunia   . Gestational diabetes 07/13/2016   no meds  . Migraines   . PID (pelvic inflammatory disease)   . Wears glasses     Patient Active Problem List   Diagnosis Date Noted  . Yeast infection 01/06/2018  . S/P primary low transverse C-section 10/26/2016  . Normal postpartum course 10/26/2016  . Gestational diabetes mellitus 09/19/2016  . Anemia of mother in pregnancy, antepartum 07/29/2016  . Daytime somnolence 07/11/2015  . Sleep disorder 07/11/2015  . Cephalalgia 07/11/2015  . Asthma, mild intermittent 07/11/2015  . Well woman exam with routine gynecological  exam 07/11/2015  . Depressed mood 07/11/2015  . Chronic nausea 07/11/2015  . Changing skin lesion 07/11/2015  . Adjustment disorder with mixed anxiety and depressed mood 07/11/2015  . Hemorrhoids 04/30/2014    Past Surgical History:  Procedure Laterality Date  . CESAREAN SECTION    . CESAREAN SECTION N/A 09/19/2016   Procedure: CESAREAN SECTION;  Surgeon: Aletha Halim, MD;  Location: [facility];  Service: Obstetrics;  Laterality: N/A;  . implanon     inserted 2010  . INCISE AND DRAIN ABCESS  2019   under arms " boils"  . WISDOM TOOTH EXTRACTION       OB History    Gravida  2   Para  2   Term  2   Preterm      AB      Living  1     SAB      TAB      Ectopic      Multiple      Live Births  1            Home Medications    Prior to Admission medications   Medication Sig Start Date End Date Taking? Authorizing Provider  albuterol (VENTOLIN HFA) 108 (90 Base) MCG/ACT inhaler Inhale 2 puffs into the lungs every 6 (six) hours as needed for wheezing or shortness of breath. 03/19/19   Evelina Dun A, FNP  amoxicillin-clavulanate (AUGMENTIN) 875-125 MG  tablet Take 1 tablet by mouth every 12 (twelve) hours for 7 days. 05/19/19 05/26/19  Ryenne Lynam E, PA-C  benzonatate (TESSALON PERLES) 100 MG capsule Take 1 capsule (100 mg total) by mouth 3 (three) times daily as needed. 03/19/19   Sharion Balloon, FNP  cetirizine (ZYRTEC) 10 MG tablet Take 10 mg by mouth daily as needed (for seasonal allergies).    [provider]  chlorhexidine gluconate, MEDLINE KIT, (PERIDEX) 0.12 % solution Use as directed 15 mLs in the mouth or throat 2 (two) times daily. 05/19/19   Evert Wenrich E, PA-C  pantoprazole (PROTONIX) 40 MG tablet Take 1 tablet (40 mg total) by mouth daily for 14 days. 12/31/18 01/14/19  Lorin Glass, PA-C  sucralfate (CARAFATE) 1 g tablet Take 1 tablet (1 g total) by mouth 4 (four) times daily -  with meals and at bedtime for 14  days. 12/31/18 01/14/19  Lorin Glass, PA-C    Family History Family History  Problem Relation Age of Onset  . Hypertension Mother   . Heart disease Mother        early 37s  . Diabetes Mother   . Diabetes Paternal Grandmother   . Stroke Paternal Grandmother     Social History Social History   Tobacco Use  . Smoking status: Former Smoker    Quit date: 03/20/2008    Years since quitting: 11.1  . Smokeless tobacco: Never Used  Substance Use Topics  . Alcohol use: No    Alcohol/week: 0.0 standard drinks  . Drug use: No     Allergies   Patient has no known allergies.   Review of Systems Review of Systems  Constitutional: Negative for chills and fever.  HENT: Positive for dental problem. Negative for congestion, facial swelling, mouth sores, sinus pressure, sinus pain, trouble swallowing and voice change.   Respiratory: Negative for shortness of breath.   Cardiovascular: Negative for chest pain.  Gastrointestinal: Negative for nausea and vomiting.  Musculoskeletal: Negative for neck pain.  Allergic/Immunologic: Negative for immunocompromised state.     Physical Exam Updated Vital Signs BP (!) 127/92 (BP Location: Left Arm) Comment: changed bp cuff  Pulse 89   Temp 98.8 F (37.1 C) (Oral)   Resp 17   SpO2 100%   Physical Exam Vitals signs and nursing note reviewed.  Constitutional:      Appearance: She is well-developed. She is not ill-appearing or toxic-appearing.  HENT:     Head: Normocephalic and atraumatic.     Nose: Nose normal.     Mouth/Throat:      Comments: Dentition with chronic decay and multiple missing teeth. No noted area of swelling or fluctuance. No trismus. Mouth opening to at least 3 finger widths. Handles oral secretions without difficulty. External exam shows no asymmetry of the jaw line or face, no signs of obvious swelling or infection. No swelling or tenderness to the submental or submandibular regions. No swelling or  tenderness into the soft tissues of the neck.Full active range of motion of the jaw. Neck is supple with full active range of motion, no tenderness to palpation of the soft tissues.   Eyes:     General: No scleral icterus.       Right eye: No discharge.        Left eye: No discharge.     Conjunctiva/sclera: Conjunctivae normal.  Neck:     Musculoskeletal: Normal range of motion.     Vascular: No JVD.  Cardiovascular:  Rate and Rhythm: Normal rate and regular rhythm.     Pulses: Normal pulses.     Heart sounds: Normal heart sounds.  Pulmonary:     Effort: Pulmonary effort is normal.     Breath sounds: Normal breath sounds.  Abdominal:     General: There is no distension.  Musculoskeletal: Normal range of motion.  Skin:    General: Skin is warm and dry.  Neurological:     Mental Status: She is oriented to person, place, and time.     GCS: GCS eye subscore is 4. GCS verbal subscore is 5. GCS motor subscore is 6.     Comments: Fluent speech, no facial droop.  Psychiatric:        Behavior: Behavior normal.      ED Treatments / Results  Labs (all labs ordered are listed, but only abnormal results are displayed) Labs Reviewed - No data to display  EKG None  Radiology No results found.  Procedures Procedures (including critical care time)  Medications Ordered in ED Medications  oxyCODONE-acetaminophen (PERCOCET/ROXICET) 5-325 MG per tablet 1 tablet (1 tablet Oral Given 05/19/19 1138)  ondansetron (ZOFRAN) tablet 4 mg (4 mg Oral Given 05/19/19 1138)     Initial Impression / Assessment and Plan / ED Course  I have reviewed the triage vital signs and the nursing notes.  Pertinent labs & imaging results that were available during my care of the patient were reviewed by me and considered in my medical decision making (see chart for details).  Pt is well appearing, afebrile.  Chronic dental decay. Patient with toothache.  No gross abscess.  Exam unconcerning for Ludwig's  angina or spread of infection.  Will treat with Augmentin and peridex, recommend anti-inflammatories.  Urged patient to follow-up with dentist.  Patient was hypertensive on arrival, repeat blood pressure shows improvement.  Pt appears stable for d/c  This note was prepared using Dragon voice recognition software and may include unintentional dictation errors due to the inherent limitations of voice recognition software.    Final Clinical Impressions(s) / ED Diagnoses   Final diagnoses:  Pain, dental    ED Discharge Orders         Ordered    amoxicillin-clavulanate (AUGMENTIN) 875-125 MG tablet  Every 12 hours     05/19/19 1127    chlorhexidine gluconate, MEDLINE KIT, (PERIDEX) 0.12 % solution  2 times daily     05/19/19 1127           Val Farnam, Erie Noe 05/19/19 1148    Davonna Belling, MD 05/19/19 1515

## 2019-05-19 NOTE — Discharge Instructions (Addendum)
Take antibiotics as directed. Please take all of your antibiotics until finished.  Prescription has been sent to your pharmacy.  Swish and spit 15 mL of chlorhexidine mouthwash for 30 seconds times daily.  Prescription has been printed he will need to take it to the pharmacy to get it filled.  You can also gargle warm salt water up to 5 times daily.  Both of these rinses can help to remove bacteria from the mouth.  You can also use viscous lidocaine every 3 hours to help numb the area.  You can apply a cool compress for 15 to 20 minutes, but avoid applying heat to the area.  Apply warm compresses to jaw throughout the day. You can take Tylenol or Ibuprofen as directed for pain. You can alternate Tylenol and Ibuprofen every 4 hours. If you take Tylenol at 1pm, then you can take Ibuprofen at 5pm. Then you can take Tylenol again at 9pm.   The exam and treatment you received today has been provided on an emergency basis only. This is not a substitute for complete medical or dental care. If your problem worsens or new symptoms (problems) appear, and you are unable to arrange prompt follow-up care with your dentist, call or return to this location. If you do not have a dentist, please follow-up with one on the list provided  CALL YOUR DENTIST OR RETURN IMMEDIATELY IF you develop a fever, rash, difficulty breathing or swallowing, neck or facial swelling, or other potentially serious concerns.

## 2020-01-27 ENCOUNTER — Other Ambulatory Visit: Payer: Self-pay

## 2020-01-27 ENCOUNTER — Encounter (HOSPITAL_COMMUNITY): Payer: Self-pay

## 2020-01-27 ENCOUNTER — Emergency Department (HOSPITAL_COMMUNITY)
Admission: EM | Admit: 2020-01-27 | Discharge: 2020-01-28 | Disposition: A | Discharge status: Home / Self Care | Payer: Self-pay | Attending: Emergency Medicine | Admitting: Emergency Medicine

## 2020-01-27 DIAGNOSIS — Z20822 Contact with and (suspected) exposure to covid-19: Secondary | ICD-10-CM | POA: Insufficient documentation

## 2020-01-27 DIAGNOSIS — Z87891 Personal history of nicotine dependence: Secondary | ICD-10-CM | POA: Insufficient documentation

## 2020-01-27 DIAGNOSIS — R05 Cough: Secondary | ICD-10-CM | POA: Insufficient documentation

## 2020-01-27 DIAGNOSIS — J4521 Mild intermittent asthma with (acute) exacerbation: Secondary | ICD-10-CM | POA: Insufficient documentation

## 2020-01-27 MED ORDER — IPRATROPIUM BROMIDE 0.02 % IN SOLN
0.5000 mg | Freq: Once | RESPIRATORY_TRACT | Status: AC
  Administered 2020-01-28: 0.5 mg via RESPIRATORY_TRACT
  Filled 2020-01-27: qty 2.5

## 2020-01-27 MED ORDER — ALBUTEROL SULFATE (2.5 MG/3ML) 0.083% IN NEBU
5.0000 mg | INHALATION_SOLUTION | Freq: Once | RESPIRATORY_TRACT | Status: AC
  Administered 2020-01-28: 5 mg via RESPIRATORY_TRACT
  Filled 2020-01-27: qty 6

## 2020-01-27 MED ORDER — METHYLPREDNISOLONE SODIUM SUCC 125 MG IJ SOLR
125.0000 mg | Freq: Once | INTRAMUSCULAR | Status: AC
  Administered 2020-01-28: 125 mg via INTRAVENOUS
  Filled 2020-01-27: qty 2

## 2020-01-27 NOTE — ED Triage Notes (Signed)
Pt woke up from nap around 8:10p with asthma attack, used inhalers x 3 times with no relief.  Coughs when tries to talk.

## 2020-01-28 ENCOUNTER — Emergency Department (HOSPITAL_COMMUNITY): Payer: Self-pay

## 2020-01-28 LAB — CBC WITH DIFFERENTIAL/PLATELET
Abs Immature Granulocytes: 0.04 10*3/uL (ref 0.00–0.07)
Basophils Absolute: 0.1 10*3/uL (ref 0.0–0.1)
Basophils Relative: 0 %
Eosinophils Absolute: 0.9 10*3/uL — ABNORMAL HIGH (ref 0.0–0.5)
Eosinophils Relative: 8 %
HCT: 39.8 % (ref 36.0–46.0)
Hemoglobin: 12.1 g/dL (ref 12.0–15.0)
Immature Granulocytes: 0 %
Lymphocytes Relative: 21 %
Lymphs Abs: 2.4 10*3/uL (ref 0.7–4.0)
MCH: 25.2 pg — ABNORMAL LOW (ref 26.0–34.0)
MCHC: 30.4 g/dL (ref 30.0–36.0)
MCV: 82.7 fL (ref 80.0–100.0)
Monocytes Absolute: 0.7 10*3/uL (ref 0.1–1.0)
Monocytes Relative: 6 %
Neutro Abs: 7.4 10*3/uL (ref 1.7–7.7)
Neutrophils Relative %: 65 %
Platelets: 267 10*3/uL (ref 150–400)
RBC: 4.81 MIL/uL (ref 3.87–5.11)
RDW: 14.8 % (ref 11.5–15.5)
WBC: 11.4 10*3/uL — ABNORMAL HIGH (ref 4.0–10.5)
nRBC: 0 % (ref 0.0–0.2)

## 2020-01-28 LAB — RESPIRATORY PANEL BY RT PCR (FLU A&B, COVID)
Influenza A by PCR: NEGATIVE
Influenza B by PCR: NEGATIVE
SARS Coronavirus 2 by RT PCR: NEGATIVE

## 2020-01-28 LAB — COMPREHENSIVE METABOLIC PANEL
ALT: 16 U/L (ref 0–44)
AST: 16 U/L (ref 15–41)
Albumin: 3.5 g/dL (ref 3.5–5.0)
Alkaline Phosphatase: 52 U/L (ref 38–126)
Anion gap: 13 (ref 5–15)
BUN: 7 mg/dL (ref 6–20)
CO2: 20 mmol/L — ABNORMAL LOW (ref 22–32)
Calcium: 9.2 mg/dL (ref 8.9–10.3)
Chloride: 105 mmol/L (ref 98–111)
Creatinine, Ser: 0.63 mg/dL (ref 0.44–1.00)
GFR calc Af Amer: >60 mL/min (ref >60)
GFR calc non Af Amer: >60 mL/min (ref >60)
Glucose, Bld: 113 mg/dL — ABNORMAL HIGH (ref 70–99)
Potassium: 4 mmol/L (ref 3.5–5.1)
Sodium: 138 mmol/L (ref 135–145)
Total Bilirubin: 0.5 mg/dL (ref 0.3–1.2)
Total Protein: 7.3 g/dL (ref 6.5–8.1)

## 2020-01-28 LAB — I-STAT BETA HCG BLOOD, ED (MC, WL, AP ONLY): I-stat hCG, quantitative: 6.6 m[IU]/mL — ABNORMAL HIGH (ref <5)

## 2020-01-28 LAB — D-DIMER, QUANTITATIVE: D-Dimer, Quant: 0.75 ug/mL-FEU — ABNORMAL HIGH (ref 0.00–0.50)

## 2020-01-28 LAB — HCG, QUANTITATIVE, PREGNANCY: hCG, Beta Chain, Quant, S: <1 m[IU]/mL (ref <5)

## 2020-01-28 MED ORDER — PREDNISONE 20 MG PO TABS: 40.0000 mg | ORAL_TABLET | Freq: Every day | ORAL | 0 refills | Status: DC

## 2020-01-28 MED ORDER — IOHEXOL 350 MG/ML SOLN
100.0000 mL | Freq: Once | INTRAVENOUS | Status: AC | PRN
  Administered 2020-01-28: 100 mL via INTRAVENOUS

## 2020-01-28 MED ORDER — ALBUTEROL SULFATE (2.5 MG/3ML) 0.083% IN NEBU: 2.5000 mg | INHALATION_SOLUTION | Freq: Four times a day (QID) | RESPIRATORY_TRACT | 0 refills | Status: DC | PRN

## 2020-01-28 MED ORDER — ALBUTEROL SULFATE HFA 108 (90 BASE) MCG/ACT IN AERS: 2.0000 | INHALATION_SPRAY | Freq: Four times a day (QID) | RESPIRATORY_TRACT | 0 refills | Status: DC | PRN

## 2020-01-28 MED ORDER — ALBUTEROL SULFATE (2.5 MG/3ML) 0.083% IN NEBU
5.0000 mg | INHALATION_SOLUTION | Freq: Once | RESPIRATORY_TRACT | Status: AC
  Administered 2020-01-28: 5 mg via RESPIRATORY_TRACT
  Filled 2020-01-28: qty 6

## 2020-01-28 MED ORDER — IPRATROPIUM BROMIDE 0.02 % IN SOLN
0.5000 mg | Freq: Once | RESPIRATORY_TRACT | Status: AC
  Administered 2020-01-28: 0.5 mg via RESPIRATORY_TRACT
  Filled 2020-01-28: qty 2.5

## 2020-01-28 NOTE — Discharge Instructions (Signed)
1. Medications: albuterol, prednisone, usual home medications °2. Treatment: rest, drink plenty of fluids, begin OTC antihistamine (Zyrtec or Claritin)  °3. Follow Up: Please followup with your primary doctor in 2-3 days for discussion of your diagnoses and further evaluation after today's visit; if you do not have a primary care doctor use the resource guide provided to find one; Please return to the ER for difficulty breathing, high fevers or worsening symptoms. ° °

## 2020-01-28 NOTE — ED Notes (Signed)
Patient transported to CT 

## 2020-01-28 NOTE — ED Notes (Signed)
Patient ambulated to bathroom independently with steady gait

## 2020-01-28 NOTE — ED Provider Notes (Signed)
[facility] EMERGENCY DEPARTMENT Provider Note   CSN: [id number] Arrival date & time: 01/27/20  2055     History Chief Complaint  Patient presents with  . Asthma    Vanessa Foster is a 34 y.o. female with a hx of asthma presents to the Emergency Department complaining of gradual, persistent, progressively worsening shortness of breath onset earlier today.  Patient reports she had associated wheezing, coughing and dyspnea on exertion.  She reports this felt similar to her previous asthma attacks however she utilized her albuterol MDI without relief.  Patient reports no Covid-like symptoms.  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  Patient denies periods of immobilization, leg swelling.  She reports persistent coughing whenever she attempts to talk.  Denies fever, chills, headache, neck pain, chest pain, abdominal pain, weakness, dizziness, syncope.  The history is provided by the patient and medical records. No language interpreter was used.       Past Medical History:  Diagnosis Date  . Abnormal Pap smear of cervix    2011?  Marland Kitchen Allergy   . Anemia   . Anxiety   . Asthma    hospitalization 2008, last attack this month  . Chronic nausea   . Depression   . Dysmenorrhea   . Dyspareunia   . Gestational diabetes 07/13/2016   no meds  . Migraines   . PID (pelvic inflammatory disease)   . Wears glasses     Patient Active Problem List   Diagnosis Date Noted  . Yeast infection 01/06/2018  . S/P primary low transverse C-section 10/26/2016  . Normal postpartum course 10/26/2016  . Gestational diabetes mellitus 09/19/2016  . Anemia of mother in pregnancy, antepartum 07/29/2016  . Daytime somnolence 07/11/2015  . Sleep disorder 07/11/2015  . Cephalalgia 07/11/2015  . Asthma, mild intermittent 07/11/2015  . Well woman exam with routine gynecological exam 07/11/2015  . Depressed mood 07/11/2015  .  Chronic nausea 07/11/2015  . Changing skin lesion 07/11/2015  . Adjustment disorder with mixed anxiety and depressed mood 07/11/2015  . Hemorrhoids 04/30/2014    Past Surgical History:  Procedure Laterality Date  . CESAREAN SECTION    . CESAREAN SECTION N/A 09/19/2016   Procedure: CESAREAN SECTION;  Surgeon: Aletha Halim, MD;  Location: [facility];  Service: Obstetrics;  Laterality: N/A;  . implanon     inserted 2010  . INCISE AND DRAIN ABCESS  2019   under arms " boils"  . WISDOM TOOTH EXTRACTION       OB History    Gravida  2   Para  2   Term  2   Preterm      AB      Living  1     SAB      TAB      Ectopic      Multiple      Live Births  1           Family History  Problem Relation Age of Onset  . Hypertension Mother   . Heart disease Mother        early 14s  . Diabetes Mother   . Diabetes Paternal Grandmother   . Stroke Paternal Grandmother     Social History   Tobacco Use  . Smoking status: Former Smoker    Quit date: 03/20/2008    Years since quitting: 11.8  . Smokeless tobacco: Never Used  Substance Use Topics  . Alcohol use: No    Alcohol/week: 0.0 standard drinks  . Drug use: No    Home Medications Prior to Admission medications   Medication Sig Start Date End Date Taking? Authorizing Provider  fluticasone (FLONASE) 50 MCG/ACT nasal spray Place 2 sprays into both nostrils daily as needed for allergies or rhinitis.   Yes [provider]  albuterol (PROVENTIL) (2.5 MG/3ML) 0.083% nebulizer solution Take 3 mLs (2.5 mg total) by nebulization every 6 (six) hours as needed for wheezing or shortness of breath. 01/28/20   Pegi Milazzo, Jarrett Soho, PA-C  albuterol (VENTOLIN HFA) 108 (90 Base) MCG/ACT inhaler Inhale 2 puffs into the lungs every 6 (six) hours as needed for wheezing or shortness of breath. 01/28/20   Talicia Sui, Jarrett Soho, PA-C  benzonatate (TESSALON PERLES) 100 MG capsule Take 1 capsule (100 mg total) by mouth 3  (three) times daily as needed. Patient not taking: Reported on 01/28/2020 03/19/19   Evelina Dun A, FNP  chlorhexidine gluconate, MEDLINE KIT, (PERIDEX) 0.12 % solution Use as directed 15 mLs in the mouth or throat 2 (two) times daily. Patient not taking: Reported on 01/28/2020 05/19/19   Albrizze, Verline Lema E, PA-C  pantoprazole (PROTONIX) 40 MG tablet Take 1 tablet (40 mg total) by mouth daily for 14 days. Patient not taking: Reported on 01/28/2020 12/31/18 01/14/19  Lorin Glass, PA-C  predniSONE (DELTASONE) 20 MG tablet Take 2 tablets (40 mg total) by mouth daily. 01/28/20   Conrado Nance, Jarrett Soho, PA-C  sucralfate (CARAFATE) 1 g tablet Take 1 tablet (1 g total) by mouth 4 (four) times daily -  with meals and at bedtime for 14 days. Patient not taking: Reported on 01/28/2020 12/31/18 01/14/19  Lorin Glass, PA-C    Allergies    Patient has no known allergies.  Review of Systems   Review of Systems  Constitutional: Negative for appetite change, diaphoresis, fatigue, fever and unexpected weight change.  HENT: Negative for mouth sores.   Eyes: Negative for visual disturbance.  Respiratory: Positive for cough and shortness of breath. Negative for chest tightness and wheezing.   Cardiovascular: Negative for chest pain.  Gastrointestinal: Negative for abdominal pain, constipation, diarrhea, nausea and vomiting.  Endocrine: Negative for polydipsia, polyphagia and polyuria.  Genitourinary: Negative for dysuria, frequency, hematuria and urgency.  Musculoskeletal: Negative for back pain and neck stiffness.  Skin: Negative for rash.  Allergic/Immunologic: Negative for immunocompromised state.  Neurological: Negative for syncope, light-headedness and headaches.  Hematological: Does not bruise/bleed easily.  Psychiatric/Behavioral: Negative for sleep disturbance. The patient is not nervous/anxious.     Physical Exam Updated Vital Signs BP (!) 147/111 (BP Location: Left Arm)   Pulse (!) 117    Temp 98.8 F (37.1 C) (Oral)   Resp 20   Ht '5\' 2"'  (1.575 m)   Wt 99.8 kg   SpO2 94%   BMI 40.24 kg/m   Physical Exam Vitals and nursing note reviewed.  Constitutional:      General: She is not in acute distress.    Appearance: She is not diaphoretic.  HENT:     Head: Normocephalic.  Eyes:     General: No scleral icterus.    Conjunctiva/sclera: Conjunctivae normal.  Cardiovascular:     Rate and Rhythm: Regular rhythm. Tachycardia present.     Pulses: Normal pulses.          Radial pulses are 2+ on the right side and 2+ on the left side.  Pulmonary:     Effort: Tachypnea  and accessory muscle usage (mild) present. No prolonged expiration, respiratory distress or retractions.     Breath sounds: No stridor. Decreased breath sounds and wheezing ( faint) present.     Comments: Equal chest rise. No increased work of breathing. Abdominal:     General: There is no distension.     Palpations: Abdomen is soft.     Tenderness: There is no abdominal tenderness. There is no guarding or rebound.  Musculoskeletal:     Cervical back: Normal range of motion.     Comments: Moves all extremities equally and without difficulty.  Skin:    General: Skin is warm and dry.     Capillary Refill: Capillary refill takes less than 2 seconds.  Neurological:     Mental Status: She is alert.     GCS: GCS eye subscore is 4. GCS verbal subscore is 5. GCS motor subscore is 6.     Comments: Speech is clear and goal oriented.  Psychiatric:        Mood and Affect: Mood normal.     ED Results / Procedures / Treatments   Labs (all labs ordered are listed, but only abnormal results are displayed) Labs Reviewed  CBC WITH DIFFERENTIAL/PLATELET - Abnormal; Notable for the following components:      Result Value   WBC 11.4 (*)    MCH 25.2 (*)    Eosinophils Absolute 0.9 (*)    All other components within normal limits  COMPREHENSIVE METABOLIC PANEL - Abnormal; Notable for the following components:   CO2  20 (*)    Glucose, Bld 113 (*)    All other components within normal limits  D-DIMER, QUANTITATIVE (NOT AT [facility]) - Abnormal; Notable for the following components:   D-Dimer, Quant 0.75 (*)    All other components within normal limits  I-STAT BETA HCG BLOOD, ED ([facility], [facility], [facility] ONLY) - Abnormal; Notable for the following components:   I-stat hCG, quantitative 6.6 (*)    All other components within normal limits  RESPIRATORY PANEL BY RT PCR (FLU A&B, COVID)  HCG, QUANTITATIVE, PREGNANCY    Radiology CT Angio Chest PE W and/or Wo Contrast  Result Date: 01/28/2020 CLINICAL DATA:  Positive D-dimer, asthma EXAM: CT ANGIOGRAPHY CHEST WITH CONTRAST TECHNIQUE: Multidetector CT imaging of the chest was performed using the standard protocol during bolus administration of intravenous contrast. Multiplanar CT image reconstructions and MIPs were obtained to evaluate the vascular anatomy. CONTRAST:  185m OMNIPAQUE IOHEXOL 350 MG/ML SOLN COMPARISON:  12/28/2018 FINDINGS: Cardiovascular: This is a technically adequate evaluation of the pulmonary vasculature. No filling defects or pulmonary emboli. The heart is unremarkable without pericardial effusion. The thoracic aorta is normal in caliber with no dissection. Mediastinum/Nodes: No enlarged mediastinal, hilar, or axillary lymph nodes. Thyroid gland, trachea, and esophagus demonstrate no significant findings. Lungs/Pleura: No airspace disease, effusion, or pneumothorax. Central airways patent. Upper Abdomen: No acute abnormality. Musculoskeletal: No acute or destructive bony lesions. Reconstructed images demonstrate no additional findings. Review of the MIP images confirms the above findings. IMPRESSION: Normal examination. No evidence of pulmonary embolus. Electronically Signed   By: MRanda NgoM.D.   On: 01/28/2020 03:27    Procedures Procedures (including critical care time)  Medications Ordered in ED Medications  methylPREDNISolone sodium succinate  (SOLU-MEDROL) 125 mg/2 mL injection 125 mg (125 mg Intravenous Given 01/28/20 0038)  albuterol (PROVENTIL) (2.5 MG/3ML) 0.083% nebulizer solution 5 mg (5 mg Nebulization Given 01/28/20 0038)  ipratropium (ATROVENT) nebulizer solution 0.5 mg (0.5 mg Nebulization  Given 01/28/20 0037)  iohexol (OMNIPAQUE) 350 MG/ML injection 100 mL (100 mLs Intravenous Contrast Given 01/28/20 0304)  ipratropium (ATROVENT) nebulizer solution 0.5 mg (0.5 mg Nebulization Given 01/28/20 0424)  albuterol (PROVENTIL) (2.5 MG/3ML) 0.083% nebulizer solution 5 mg (5 mg Nebulization Given 01/28/20 0424)    ED Course  I have reviewed the triage vital signs and the nursing notes.  Pertinent labs & imaging results that were available during my care of the patient were reviewed by me and considered in my medical decision making (see chart for details).  Clinical Course as of Jan 27 622  Sun Jan 28, 2020  0307 Elevated.  Patient tachycardic on arrival.  Will obtain CT angiogram for pulmonary embolism.  D-Dimer, Quant(!): 0.75 [HM]  0307 Minimally elevated hCG quant negative.  I-stat hCG, quantitative(!): 6.6 [HM]  0307 Leukocytosis noted.  WBC(!): 11.4 [HM]    Clinical Course User Index [HM] Imo Cumbie, Gwenlyn Perking   MDM Rules/Calculators/A&P                       Presents with asthma exacerbation.  Given only faint wheezes, did also consider possibility of pulmonary embolus.  Patient is overall low risk.  D-dimer pending.  Will give albuterol, Atrovent and Solu-Medrol.  Dimer elevated.  Will obtain CT angiogram.  Patient has made mild improvement after the first albuterol.  Will give additional.  6:23 AM Reports she is feeling significantly better after her second albuterol treatment and steroids.  She reports she feels as if she can go home.  CT angiogram without evidence of pulmonary embolism.  Patient discharged with steroids, albuterol and prescription for home nebulizer.  Covid and influenza negative.  No Hypoxia here  in the emergency department.   Final Clinical Impression(s) / ED Diagnoses Final diagnoses:  Mild intermittent asthma with exacerbation    Rx / DC Orders ED Discharge Orders         Ordered    albuterol (VENTOLIN HFA) 108 (90 Base) MCG/ACT inhaler  Every 6 hours PRN     01/28/20 0620    For home use only DME Nebulizer machine     01/28/20 0620    predniSONE (DELTASONE) 20 MG tablet  Daily     01/28/20 0620    albuterol (PROVENTIL) (2.5 MG/3ML) 0.083% nebulizer solution  Every 6 hours PRN     01/28/20 0620           Candee Hoon, Jarrett Soho, PA-C 01/28/20 4970    Ezequiel Essex, MD 01/28/20 [phone]

## 2020-07-10 ENCOUNTER — Emergency Department (HOSPITAL_COMMUNITY)
Admission: EM | Admit: 2020-07-10 | Discharge: 2020-07-10 | Disposition: A | Discharge status: Home / Self Care | Payer: Self-pay | Attending: Emergency Medicine | Admitting: Emergency Medicine

## 2020-07-10 ENCOUNTER — Encounter (HOSPITAL_COMMUNITY): Payer: Self-pay

## 2020-07-10 ENCOUNTER — Other Ambulatory Visit: Payer: Self-pay

## 2020-07-10 DIAGNOSIS — M545 Low back pain, unspecified: Secondary | ICD-10-CM | POA: Insufficient documentation

## 2020-07-10 DIAGNOSIS — Z7951 Long term (current) use of inhaled steroids: Secondary | ICD-10-CM | POA: Insufficient documentation

## 2020-07-10 DIAGNOSIS — Z87891 Personal history of nicotine dependence: Secondary | ICD-10-CM | POA: Insufficient documentation

## 2020-07-10 DIAGNOSIS — J4520 Mild intermittent asthma, uncomplicated: Secondary | ICD-10-CM | POA: Insufficient documentation

## 2020-07-10 LAB — URINALYSIS, ROUTINE W REFLEX MICROSCOPIC
Bacteria, UA: NONE SEEN
Bilirubin Urine: NEGATIVE
Glucose, UA: NEGATIVE mg/dL
Hgb urine dipstick: NEGATIVE
Ketones, ur: NEGATIVE mg/dL
Leukocytes,Ua: NEGATIVE
Nitrite: NEGATIVE
Protein, ur: 30 mg/dL — AB
Specific Gravity, Urine: 1.02 (ref 1.005–1.030)
pH: 8 (ref 5.0–8.0)

## 2020-07-10 LAB — POC URINE PREG, ED: Preg Test, Ur: NEGATIVE

## 2020-07-10 MED ORDER — NAPROXEN 500 MG PO TABS: 500.0000 mg | ORAL_TABLET | Freq: Two times a day (BID) | ORAL | 0 refills | Status: DC | PRN

## 2020-07-10 MED ORDER — METHOCARBAMOL 500 MG PO TABS: 500.0000 mg | ORAL_TABLET | Freq: Three times a day (TID) | ORAL | 0 refills | Status: DC | PRN

## 2020-07-10 NOTE — ED Notes (Signed)
Patient verbalizes understanding of discharge instructions. Opportunity for questioning and answers were provided. Armband removed by staff, pt discharged from ED to home via POV  

## 2020-07-10 NOTE — ED Triage Notes (Addendum)
Pt arrives to ED w/ c/o lower back pain x 1 week. Pt reports 6/10 pain. Denies relief from NSAIDs. Pt denies urinary symptoms.

## 2020-07-10 NOTE — ED Provider Notes (Signed)
[facility] EMERGENCY DEPARTMENT Provider Note   CSN: [id number] Arrival date & time: 07/10/20  2033     History Chief Complaint  Patient presents with  . Back Pain    Vanessa Foster is a 34 y.o. female with a history of migraines, anxiety, depression, & asthma who presents to the ED with complaints of lower back pain x 1 week. Pain is constant, bilateral, worse with certain positions/movements. No alleviating factors. Tried OTC ibuprofen without relief. Patient thinks pain potentially related to heavy lifting recently. Denies numbness, tingling, weakness, saddle anesthesia, incontinence to bowel/bladder, fever, chills, IV drug use, dysuria, or hx of cancer. Patient has not had prior back surgeries. Denies recent direct trauma. Hx of similar pain which improved with physical therapy.   HPI     Past Medical History:  Diagnosis Date  . Abnormal Pap smear of cervix    2011?  Marland Kitchen Allergy   . Anemia   . Anxiety   . Asthma    hospitalization 2008, last attack this month  . Chronic nausea   . Depression   . Dysmenorrhea   . Dyspareunia   . Gestational diabetes 07/13/2016   no meds  . Migraines   . PID (pelvic inflammatory disease)   . Wears glasses     Patient Active Problem List   Diagnosis Date Noted  . Yeast infection 01/06/2018  . S/P primary low transverse C-section 10/26/2016  . Normal postpartum course 10/26/2016  . Gestational diabetes mellitus 09/19/2016  . Anemia of mother in pregnancy, antepartum 07/29/2016  . Daytime somnolence 07/11/2015  . Sleep disorder 07/11/2015  . Cephalalgia 07/11/2015  . Asthma, mild intermittent 07/11/2015  . Well woman exam with routine gynecological exam 07/11/2015  . Depressed mood 07/11/2015  . Chronic nausea 07/11/2015  . Changing skin lesion 07/11/2015  . Adjustment disorder with mixed anxiety and depressed mood 07/11/2015  . Hemorrhoids 04/30/2014    Past Surgical History:  Procedure Laterality  Date  . CESAREAN SECTION    . CESAREAN SECTION N/A 09/19/2016   Procedure: CESAREAN SECTION;  Surgeon: Aletha Halim, MD;  Location: [place];  Service: Obstetrics;  Laterality: N/A;  . implanon     inserted 2010  . INCISE AND DRAIN ABCESS  2019   under arms " boils"  . WISDOM TOOTH EXTRACTION       OB History    Gravida  2   Para  2   Term  2   Preterm      AB      Living  1     SAB      TAB      Ectopic      Multiple      Live Births  1           Family History  Problem Relation Age of Onset  . Hypertension Mother   . Heart disease Mother        early 54s  . Diabetes Mother   . Diabetes Paternal Grandmother   . Stroke Paternal Grandmother     Social History   Tobacco Use  . Smoking status: Former Smoker    Quit date: 03/20/2008    Years since quitting: 12.3  . Smokeless tobacco: Never Used  Vaping Use  . Vaping Use: Never used  Substance Use Topics  . Alcohol use: No    Alcohol/week: 0.0 standard drinks  . Drug use: No    Home Medications Prior to Admission  medications   Medication Sig Start Date End Date Taking? Authorizing Provider  albuterol (PROVENTIL) (2.5 MG/3ML) 0.083% nebulizer solution Take 3 mLs (2.5 mg total) by nebulization every 6 (six) hours as needed for wheezing or shortness of breath. 01/28/20   Muthersbaugh, Jarrett Soho, PA-C  albuterol (VENTOLIN HFA) 108 (90 Base) MCG/ACT inhaler Inhale 2 puffs into the lungs every 6 (six) hours as needed for wheezing or shortness of breath. 01/28/20   Muthersbaugh, Jarrett Soho, PA-C  benzonatate (TESSALON PERLES) 100 MG capsule Take 1 capsule (100 mg total) by mouth 3 (three) times daily as needed. Patient not taking: Reported on 01/28/2020 03/19/19   Evelina Dun A, FNP  chlorhexidine gluconate, MEDLINE KIT, (PERIDEX) 0.12 % solution Use as directed 15 mLs in the mouth or throat 2 (two) times daily. Patient not taking: Reported on 01/28/2020 05/19/19   Albrizze, Verline Lema E, PA-C  fluticasone  [facility]) 50 MCG/ACT nasal spray Place 2 sprays into both nostrils daily as needed for allergies or rhinitis.    [provider]  pantoprazole (PROTONIX) 40 MG tablet Take 1 tablet (40 mg total) by mouth daily for 14 days. Patient not taking: Reported on 01/28/2020 12/31/18 01/14/19  Lorin Glass, PA-C  predniSONE (DELTASONE) 20 MG tablet Take 2 tablets (40 mg total) by mouth daily. 01/28/20   Muthersbaugh, Jarrett Soho, PA-C  sucralfate (CARAFATE) 1 g tablet Take 1 tablet (1 g total) by mouth 4 (four) times daily -  with meals and at bedtime for 14 days. Patient not taking: Reported on 01/28/2020 12/31/18 01/14/19  Lorin Glass, PA-C    Allergies    Patient has no known allergies.  Review of Systems   Review of Systems  Constitutional: Negative for chills and fever.  Respiratory: Negative for shortness of breath.   Cardiovascular: Negative for chest pain.  Gastrointestinal: Negative for abdominal pain, nausea and vomiting.  Genitourinary: Negative for dysuria and hematuria.  Musculoskeletal: Positive for back pain.  Neurological: Negative for weakness and numbness.       Negative for incontinence or saddle anesthesia.     Physical Exam Updated Vital Signs BP 127/86   Pulse 80   Temp 98 F (36.7 C) (Oral)   Resp 16   SpO2 98%   Physical Exam Constitutional:      General: She is not in acute distress.    Appearance: She is well-developed. She is not toxic-appearing.  HENT:     Head: Normocephalic and atraumatic.  Cardiovascular:     Rate and Rhythm: Normal rate.  Pulmonary:     Effort: Pulmonary effort is normal.  Abdominal:     General: There is no distension.     Palpations: Abdomen is soft.     Tenderness: There is no abdominal tenderness. There is no guarding or rebound.  Musculoskeletal:     Cervical back: Normal range of motion and neck supple. No spinous process tenderness or muscular tenderness.     Comments: No obvious deformity, appreciable swelling,  erythema, ecchymosis, significant open wounds, or increased warmth.  Extremities: Normal ROM. Nontender.  Back: No point/focal vertebral tenderness, no palpable step off or crepitus. Tender to the right lumbar paraspinal muscles.   Skin:    General: Skin is warm and dry.     Findings: No rash.  Neurological:     Mental Status: She is alert.     Deep Tendon Reflexes:     Reflex Scores:      Patellar reflexes are 2+ on the right side and  2+ on the left side.    Comments: Sensation grossly intact to bilateral lower extremities. 5/5 symmetric strength with plantar/dorsiflexion bilaterally. Gait is intact without obvious foot drop.      ED Results / Procedures / Treatments   Labs (all labs ordered are listed, but only abnormal results are displayed) Labs Reviewed  URINALYSIS, ROUTINE W REFLEX MICROSCOPIC - Abnormal; Notable for the following components:      Result Value   APPearance HAZY (*)    Protein, ur 30 (*)    All other components within normal limits  POC URINE PREG, ED    EKG None  Radiology No results found.  Procedures Procedures (including critical care time)  Medications Ordered in ED Medications - No data to display  ED Course  I have reviewed the triage vital signs and the nursing notes.  Pertinent labs & imaging results that were available during my care of the patient were reviewed by me and considered in my medical decision making (see chart for details).    MDM Rules/Calculators/A&P                         Patient presents with complaint of back pain.  Patient is nontoxic appearing, vitals are WNL. Patient has normal neurologic exam, no point/focal midline tenderness to palpation. Ambulatory in the ED.  No back pain red flags. No urinary sxs.I reviewed and interpreted labs ordered per triage- negative preg test & UA w/o findings of UTI- proteinuria- PCP recheck. Most likely muscle strain versus spasm. Considered UTI/pyelonephritis, kidney stone, aortic  aneurysm/dissection, cauda equina or epidural abscess however these do not feel these diagnoses fit clinical picture at this time. Will treat with Naproxen and Robaxin, discussed with patient that they are not to drive or operate heavy machinery while taking Robaxin. Will place referral for physical therapy evaluation as well as this has helped patient's in the past. I discussed treatment plan, need for PCP follow-up, and return precautions with the patient. Provided opportunity for questions, patient confirmed understanding and is in agreement with plan.   Final Clinical Impression(s) / ED Diagnoses Final diagnoses:  Acute low back pain without sciatica, unspecified back pain laterality    Rx / DC Orders ED Discharge Orders         Ordered    naproxen (NAPROSYN) 500 MG tablet  2 times daily PRN        07/10/20 2302    methocarbamol (ROBAXIN) 500 MG tablet  Every 8 hours PRN        07/10/20 2302    Ambulatory referral to Physical Therapy        07/10/20 2304           Amaryllis Dyke, PA-C 07/10/20 2306    Palumbo, April, MD 07/11/20 0105

## 2020-07-10 NOTE — Discharge Instructions (Signed)
You were seen in the emergency department for back pain today.  At this time we suspect that your pain is related to a muscle strain/spasm.   I have prescribed you an anti-inflammatory medication and a muscle relaxer.  - Naproxen is a nonsteroidal anti-inflammatory medication that will help with pain and swelling. Be sure to take this medication as prescribed with food, 1 pill every 12 hours,  It should be taken with food, as it can cause stomach upset, and more seriously, stomach bleeding. Do not take other nonsteroidal anti-inflammatory medications with this such as Advil, Motrin, Aleve, Mobic, Goodie Powder, or Motrin.    - Robaxin is the muscle relaxer I have prescribed, this is meant to help with muscle tightness. Be aware that this medication may make you drowsy therefore the first time you take this it should be at a time you are in an environment where you can rest. Do not drive or operate heavy machinery when taking this medication. Do not drink alcohol or take other sedating medications with this medicine such as narcotics or benzodiazepines.   You make take Tylenol per over the counter dosing with these medications.   We have prescribed you new medication(s) today. Discuss the medications prescribed today with your pharmacist as they can have adverse effects and interactions with your other medicines including over the counter and prescribed medications. Seek medical evaluation if you start to experience new or abnormal symptoms after taking one of these medicines, seek care immediately if you start to experience difficulty breathing, feeling of your throat closing, facial swelling, or rash as these could be indications of a more serious allergic reaction   The application of heat can help soothe the pain.  Maintaining your daily activities, including walking, is encourged, as it will help you get better faster than just staying in bed.  We have placed an order for an ambulatory referral to  physical therapy as well.   Your urine pregnancy test was negative Your urine had protein in it- please have this rechecked by a primary care provider within 2 weeks.   Your pain should get better over the next 2 weeks.  You will need to follow up with  Your primary healthcare provider in 1-2 weeks for reassessment, if you do not have a primary care provider one is provided in your discharge instructions- you may see the [place] clinic or call the provided phone number. However return to the ER should you develop ne or worsening symptoms or any other concerns including but not limited to severe or worsening pain, low back pain with fever, numbness, weakness, loss of bowel or bladder control, or inability to walk or urinate, you should return to the ER immediately.

## 2020-10-31 ENCOUNTER — Emergency Department (HOSPITAL_COMMUNITY): Payer: Self-pay

## 2020-10-31 ENCOUNTER — Other Ambulatory Visit: Payer: Self-pay

## 2020-10-31 ENCOUNTER — Emergency Department (HOSPITAL_COMMUNITY)
Admission: EM | Admit: 2020-10-31 | Discharge: 2020-11-01 | Disposition: A | Discharge status: Home / Self Care | Payer: Self-pay | Attending: Emergency Medicine | Admitting: Emergency Medicine

## 2020-10-31 ENCOUNTER — Encounter (HOSPITAL_COMMUNITY): Payer: Self-pay

## 2020-10-31 DIAGNOSIS — N63 Unspecified lump in unspecified breast: Secondary | ICD-10-CM | POA: Insufficient documentation

## 2020-10-31 DIAGNOSIS — Z87891 Personal history of nicotine dependence: Secondary | ICD-10-CM | POA: Insufficient documentation

## 2020-10-31 DIAGNOSIS — J45909 Unspecified asthma, uncomplicated: Secondary | ICD-10-CM | POA: Insufficient documentation

## 2020-10-31 DIAGNOSIS — R0789 Other chest pain: Secondary | ICD-10-CM | POA: Insufficient documentation

## 2020-10-31 DIAGNOSIS — L02412 Cutaneous abscess of left axilla: Secondary | ICD-10-CM | POA: Insufficient documentation

## 2020-10-31 LAB — CBC
HCT: 34.9 % — ABNORMAL LOW (ref 36.0–46.0)
Hemoglobin: 11.1 g/dL — ABNORMAL LOW (ref 12.0–15.0)
MCH: 25.8 pg — ABNORMAL LOW (ref 26.0–34.0)
MCHC: 31.8 g/dL (ref 30.0–36.0)
MCV: 81.2 fL (ref 80.0–100.0)
Platelets: 250 10*3/uL (ref 150–400)
RBC: 4.3 MIL/uL (ref 3.87–5.11)
RDW: 14.6 % (ref 11.5–15.5)
WBC: 10.6 10*3/uL — ABNORMAL HIGH (ref 4.0–10.5)
nRBC: 0 % (ref 0.0–0.2)

## 2020-10-31 LAB — BASIC METABOLIC PANEL
Anion gap: 7 (ref 5–15)
BUN: 7 mg/dL (ref 6–20)
CO2: 26 mmol/L (ref 22–32)
Calcium: 9.1 mg/dL (ref 8.9–10.3)
Chloride: 105 mmol/L (ref 98–111)
Creatinine, Ser: 0.7 mg/dL (ref 0.44–1.00)
GFR, Estimated: >60 mL/min (ref >60)
Glucose, Bld: 124 mg/dL — ABNORMAL HIGH (ref 70–99)
Potassium: 3.4 mmol/L — ABNORMAL LOW (ref 3.5–5.1)
Sodium: 138 mmol/L (ref 135–145)

## 2020-10-31 LAB — I-STAT BETA HCG BLOOD, ED (NOT ORDERABLE): I-stat hCG, quantitative: <5 m[IU]/mL (ref <5)

## 2020-10-31 LAB — TROPONIN I (HIGH SENSITIVITY): Troponin I (High Sensitivity): <2 ng/L (ref <18)

## 2020-10-31 MED ORDER — IBUPROFEN 800 MG PO TABS: 800.0000 mg | ORAL_TABLET | Freq: Three times a day (TID) | ORAL | 0 refills | Status: DC

## 2020-10-31 MED ORDER — SULFAMETHOXAZOLE-TRIMETHOPRIM 800-160 MG PO TABS
1.0000 | ORAL_TABLET | Freq: Once | ORAL | Status: AC
  Administered 2020-11-01: 1 via ORAL
  Filled 2020-10-31: qty 1

## 2020-10-31 MED ORDER — IBUPROFEN 800 MG PO TABS
800.0000 mg | ORAL_TABLET | Freq: Once | ORAL | Status: AC
  Administered 2020-11-01: 800 mg via ORAL
  Filled 2020-10-31: qty 1

## 2020-10-31 MED ORDER — SULFAMETHOXAZOLE-TRIMETHOPRIM 800-160 MG PO TABS: 1.0000 | ORAL_TABLET | Freq: Two times a day (BID) | ORAL | 0 refills | Status: AC

## 2020-10-31 NOTE — ED Provider Notes (Signed)
[facility]-EMERGENCY DEPT Provider Note   CSN: [id number] Arrival date & time: 10/31/20  1900     History Chief Complaint  Patient presents with  . Chest Pain    Vanessa Foster is a 35 y.o. female presents to the Emergency Department complaining of gradual, persistent, progressively worsening painful breast mass onset 2 days ago that has progressed to chest pain.  Patient reports she noticed the pain from her breast radiating into her chest this morning around 10 AM.  She reports it has been constant since that time.  It is worse with movement and palpation of the breast.  No exertional chest pain, no shortness of breath no leg swelling.  Patient denies a history of breast cancer, oral contraceptive use, hormone use, periods of immobilization, DVT.  Patient reports she is a history of hidradenitis and regularly gets abscesses in the axilla.  She reports this morning she noticed pain and swelling in the left axilla adding to her pain.  She reports it feels like an early abscess.  No treatments prior to arrival.  The history is provided by the patient and medical records. No language interpreter was used.       Past Medical History:  Diagnosis Date  . Abnormal Pap smear of cervix    2011?  Marland Kitchen Allergy   . Anemia   . Anxiety   . Asthma    hospitalization 2008, last attack this month  . Chronic nausea   . Depression   . Dysmenorrhea   . Dyspareunia   . Gestational diabetes 07/13/2016   no meds  . Migraines   . PID (pelvic inflammatory disease)   . Wears glasses     Patient Active Problem List   Diagnosis Date Noted  . Yeast infection 01/06/2018  . S/P primary low transverse C-section 10/26/2016  . Normal postpartum course 10/26/2016  . Gestational diabetes mellitus 09/19/2016  . Anemia of mother in pregnancy, antepartum 07/29/2016  . Daytime somnolence 07/11/2015  . Sleep disorder 07/11/2015  . Cephalalgia 07/11/2015  . Asthma, mild  intermittent 07/11/2015  . Well woman exam with routine gynecological exam 07/11/2015  . Depressed mood 07/11/2015  . Chronic nausea 07/11/2015  . Changing skin lesion 07/11/2015  . Adjustment disorder with mixed anxiety and depressed mood 07/11/2015  . Hemorrhoids 04/30/2014    Past Surgical History:  Procedure Laterality Date  . CESAREAN SECTION    . CESAREAN SECTION N/A 09/19/2016   Procedure: CESAREAN SECTION;  Surgeon: [place] [facility], MD;  Location: [facility] BIRTHING SUITES;  Service: Obstetrics;  Laterality: N/A;  . implanon     inserted 2010  . INCISE AND DRAIN ABCESS  2019   under arms " boils"  . WISDOM TOOTH EXTRACTION       OB History    Gravida  2   Para  2   Term  2   Preterm      AB      Living  1     SAB      IAB      Ectopic      Multiple      Live Births  1           Family History  Problem Relation Age of Onset  . Hypertension Mother   . Heart disease Mother        early 14s  . Diabetes Mother   . Diabetes Paternal Grandmother   . Stroke Paternal Grandmother  Social History   Tobacco Use  . Smoking status: Former Smoker    Quit date: 03/20/2008    Years since quitting: 12.6  . Smokeless tobacco: Never Used  Vaping Use  . Vaping Use: Never used  Substance Use Topics  . Alcohol use: No    Alcohol/week: 0.0 standard drinks  . Drug use: No    Home Medications Prior to Admission medications   Medication Sig Start Date End Date Taking? Authorizing Provider  ibuprofen (ADVIL) 800 MG tablet Take 1 tablet (800 mg total) by mouth 3 (three) times daily. 10/31/20  Yes Anyelin Mogle, Dahlia Client, PA-C  sulfamethoxazole-trimethoprim (BACTRIM DS) 800-160 MG tablet Take 1 tablet by mouth 2 (two) times daily for 7 days. 10/31/20 11/07/20 Yes Ferris Fielden, Dahlia Client, PA-C  albuterol (PROVENTIL) (2.5 MG/3ML) 0.083% nebulizer solution Take 3 mLs (2.5 mg total) by nebulization every 6 (six) hours as needed for wheezing or shortness of breath. 01/28/20    Gayl Ivanoff, Dahlia Client, PA-C  albuterol (VENTOLIN HFA) 108 (90 Base) MCG/ACT inhaler Inhale 2 puffs into the lungs every 6 (six) hours as needed for wheezing or shortness of breath. 01/28/20   Sereen Schaff, Dahlia Client, PA-C  fluticasone (FLONASE) 50 MCG/ACT nasal spray Place 2 sprays into both nostrils daily as needed for allergies or rhinitis.    [provider]  methocarbamol (ROBAXIN) 500 MG tablet Take 1 tablet (500 mg total) by mouth every 8 (eight) hours as needed for muscle spasms. 07/10/20   Petrucelli, Samantha R, PA-C  naproxen (NAPROSYN) 500 MG tablet Take 1 tablet (500 mg total) by mouth 2 (two) times daily as needed for moderate pain. 07/10/20   Petrucelli, Samantha R, PA-C  predniSONE (DELTASONE) 20 MG tablet Take 2 tablets (40 mg total) by mouth daily. 01/28/20   Damoni Erker, Dahlia Client, PA-C  pantoprazole (PROTONIX) 40 MG tablet Take 1 tablet (40 mg total) by mouth daily for 14 days. Patient not taking: Reported on 01/28/2020 12/31/18 07/10/20  Cristina Gong, PA-C  sucralfate (CARAFATE) 1 g tablet Take 1 tablet (1 g total) by mouth 4 (four) times daily -  with meals and at bedtime for 14 days. Patient not taking: Reported on 01/28/2020 12/31/18 07/10/20  Cristina Gong, PA-C    Allergies    Patient has no known allergies.  Review of Systems   Review of Systems  Constitutional: Negative for appetite change, diaphoresis, fatigue, fever and unexpected weight change.  HENT: Negative for mouth sores.   Eyes: Negative for visual disturbance.  Respiratory: Negative for cough, chest tightness, shortness of breath and wheezing.   Cardiovascular: Positive for chest pain.  Gastrointestinal: Negative for abdominal pain, constipation, diarrhea, nausea and vomiting.  Endocrine: Negative for polydipsia, polyphagia and polyuria.  Genitourinary: Negative for dysuria, frequency, hematuria and urgency.       Breast pain  Musculoskeletal: Negative for back pain and neck stiffness.  Skin:  Positive for color change and wound. Negative for rash.  Allergic/Immunologic: Negative for immunocompromised state.  Neurological: Negative for syncope, light-headedness and headaches.  Hematological: Does not bruise/bleed easily.  Psychiatric/Behavioral: Negative for sleep disturbance. The patient is not nervous/anxious.     Physical Exam Updated Vital Signs BP 115/81 (BP Location: Right Arm)   Pulse 84   Temp 98.4 F (36.9 C) (Oral)   Resp 19   Ht 5\' 6"  (1.676 m)   Wt 100.2 kg   SpO2 97%   BMI 35.67 kg/m   Physical Exam Vitals and nursing note reviewed.  Constitutional:      General:  She is not in acute distress.    Appearance: She is not diaphoretic.  HENT:     Head: Normocephalic and atraumatic.  Eyes:     General: No scleral icterus.    Conjunctiva/sclera: Conjunctivae normal.  Cardiovascular:     Rate and Rhythm: Normal rate and regular rhythm.     Pulses: Normal pulses.          Radial pulses are 2+ on the right side and 2+ on the left side.     Heart sounds: Normal heart sounds. No murmur heard.   Pulmonary:     Effort: No tachypnea, accessory muscle usage, prolonged expiration, respiratory distress or retractions.     Breath sounds: Normal breath sounds. No stridor.     Comments: Equal chest rise. No increased work of breathing. Chest:  Breasts:     Right: Normal. No axillary adenopathy.     Left: Mass, tenderness and axillary adenopathy present. No swelling, bleeding, inverted nipple or nipple discharge.     Abdominal:     General: There is no distension.     Palpations: Abdomen is soft.     Tenderness: There is no abdominal tenderness. There is no guarding or rebound.  Musculoskeletal:     Cervical back: Normal range of motion.     Comments: Moves all extremities equally and without difficulty.  Lymphadenopathy:     Upper Body:     Right upper body: No axillary adenopathy.     Left upper body: Axillary adenopathy present.  Skin:    General:  Skin is warm and dry.     Capillary Refill: Capillary refill takes less than 2 seconds.  Neurological:     Mental Status: She is alert.     GCS: GCS eye subscore is 4. GCS verbal subscore is 5. GCS motor subscore is 6.     Comments: Speech is clear and goal oriented.  Psychiatric:        Mood and Affect: Mood normal.     ED Results / Procedures / Treatments   Labs (all labs ordered are listed, but only abnormal results are displayed) Labs Reviewed  BASIC METABOLIC PANEL - Abnormal; Notable for the following components:      Result Value   Potassium 3.4 (*)    Glucose, Bld 124 (*)    All other components within normal limits  CBC - Abnormal; Notable for the following components:   WBC 10.6 (*)    Hemoglobin 11.1 (*)    HCT 34.9 (*)    MCH 25.8 (*)    All other components within normal limits  I-STAT BETA HCG BLOOD, ED ([facility], [facility], [facility] ONLY)  I-STAT BETA HCG BLOOD, ED (NOT ORDERABLE)  TROPONIN I (HIGH SENSITIVITY)  TROPONIN I (HIGH SENSITIVITY)    EKG EKG Interpretation  Date/Time:  Thursday October 31 2020 19:12:39 EST Ventricular Rate:  88 PR Interval:    QRS Duration: 88 QT Interval:  366 QTC Calculation: 443 R Axis:   77 Text Interpretation: Sinus rhythm Probable left atrial enlargement No significant change was found Confirmed by Paula Libra ([id number]) on 10/31/2020 10:34:44 PM    Radiology DG Chest 2 View  Result Date: 10/31/2020 CLINICAL DATA:  Intermittent dull chest pain with pressure which woke from sleep EXAM: CHEST - 2 VIEW COMPARISON:  Radiograph 03/11/2010 FINDINGS: Accounting for low volumes and overlying breast tissue, the lungs are clear. No consolidation, features of edema, pneumothorax, or effusion. Pulmonary vascularity is normally distributed. The cardiomediastinal contours are  unremarkable. No acute osseous or soft tissue abnormality. IMPRESSION: No acute cardiopulmonary abnormality. Electronically Signed   By: Kreg Shropshire M.D.   On: 10/31/2020 19:49     Procedures Procedures   Medications Ordered in ED Medications  sulfamethoxazole-trimethoprim (BACTRIM DS) 800-160 MG per tablet 1 tablet (1 tablet Oral Given 11/01/20 0057)  ibuprofen (ADVIL) tablet 800 mg (800 mg Oral Given 11/01/20 0056)    ED Course  I have reviewed the triage vital signs and the nursing notes.  Pertinent labs & imaging results that were available during my care of the patient were reviewed by me and considered in my medical decision making (see chart for details).  Clinical Course as of 11/01/20 0345  Thu Oct 31, 2020  2306 WBC(!): 10.6 Mild leukocytosis noted [HM]  2307 Temp: 98.4 F (36.9 C) Afebrile without tachycardia or hypotension.  No evidence of sepsis. [HM]  2311 DG Chest 2 View No evidence of pneumothorax, pulmonary edema or widened mediastinum. [HM]    Clinical Course User Index [HM] Tra Wilemon, Boyd Kerbs   MDM Rules/Calculators/A&P                          Patient presents with complaints of chest pain without exertional symptoms, shortness of breath, nausea or vomiting.  On exam patient with tender breast mass which reproduces her chest pain with palpation.  Additionally she has a indurated abscess of the left axilla.  Actively draining.  No palpable fluctuance.  I do not think that I&D will improve her symptoms at this time.  Will give antibiotic and refer to [facility] and wellness for further evaluation of her breast lump.  Initial troponin negative after 12 hours of constant chest pain.  EKG without ischemia.  Highly doubt ACS.  No tachycardia, shortness of breath or pleuritic chest pain.  Doubt pulmonary embolism.  Chest x-ray without evidence of infection, pneumothorax or pulmonary edema.   Final Clinical Impression(s) / ED Diagnoses Final diagnoses:  Atypical chest pain  Breast mass in female  Abscess of left axilla    Rx / DC Orders ED Discharge Orders         Ordered    sulfamethoxazole-trimethoprim (BACTRIM DS) 800-160 MG  tablet  2 times daily        10/31/20 2317    ibuprofen (ADVIL) 800 MG tablet  3 times daily        10/31/20 2317           Bethenny Losee, Boyd Kerbs 11/01/20 0345    Molpus, Jonny Ruiz, MD 11/01/20 [phone]

## 2020-10-31 NOTE — Discharge Instructions (Addendum)
1. Medications: Bactrim, ibuprofen usual home medications 2. Treatment: rest, drink plenty of fluids, use warm compresses, flush abscess with warm water several times per day 3. Follow Up: Please followup with [facility] this week for further evaluation of your breast mass; Please return to the ER for fevers, chills, nausea, vomiting or other signs of infection

## 2020-10-31 NOTE — ED Triage Notes (Signed)
Patient states she had intermittent dull chest pain and today she had pressure that woke her up from sleep and has been constant. Patient states she has SOB, but denies N/v and diaphoresis.

## 2020-11-08 ENCOUNTER — Ambulatory Visit
Admission: RE | Admit: 2020-11-08 | Discharge: 2020-11-08 | Disposition: A | Discharge status: Home / Self Care | Payer: Self-pay | Source: Ambulatory Visit | Attending: Emergency Medicine | Admitting: Emergency Medicine

## 2020-11-08 ENCOUNTER — Other Ambulatory Visit: Payer: Self-pay

## 2020-11-08 VITALS — BP 140/103 | HR 87 | Temp 98.3°F | Resp 18

## 2020-11-08 DIAGNOSIS — L02412 Cutaneous abscess of left axilla: Secondary | ICD-10-CM

## 2020-11-08 DIAGNOSIS — N6002 Solitary cyst of left breast: Secondary | ICD-10-CM

## 2020-11-08 MED ORDER — ONDANSETRON 4 MG PO TBDP: 4.0000 mg | ORAL_TABLET | Freq: Three times a day (TID) | ORAL | 0 refills | Status: DC | PRN

## 2020-11-08 MED ORDER — DOXYCYCLINE HYCLATE 100 MG PO CAPS: 100.0000 mg | ORAL_CAPSULE | Freq: Two times a day (BID) | ORAL | 0 refills | Status: AC

## 2020-11-08 MED ORDER — IBUPROFEN 800 MG PO TABS: 800.0000 mg | ORAL_TABLET | Freq: Three times a day (TID) | ORAL | 0 refills | Status: DC

## 2020-11-08 NOTE — Discharge Instructions (Signed)
Please begin doxycycline for 10 days ° °Apply warm compresses/hot rags to area with massage to express further drainage especially the first 24-48 hours ° °Use anti-inflammatories for pain/swelling. You may take up to 800 mg Ibuprofen every 8 hours with food. You may supplement Ibuprofen with Tylenol 500-1000 mg every 8 hours.  ° °Return if symptoms returning or not improving ° °

## 2020-11-08 NOTE — ED Triage Notes (Signed)
Pt c/o abscess to lt breast for over a week. States seen in the ED a week ago and it was drained and given antibiotics. States its now hard, bigger, and more painful.

## 2020-11-09 NOTE — ED Provider Notes (Signed)
[facility]    CSN: [id number] Arrival date & time: 11/08/20  1716      History   Chief Complaint Chief Complaint  Patient presents with  . appt 5 - abscess     HPI Vanessa Foster is a 35 y.o. female presenting today for evaluation of left axillary abscess. Patient reports that she was to emergency room approximately 1 week ago and was started on Bactrim, abscess was believed to drain at the time, but reports over the past few days has developed increased swelling to the area and increased pain.  She also expresses concern of an area to her left breast, nontender, but has increased in size recently.  HPI  Past Medical History:  Diagnosis Date  . Abnormal Pap smear of cervix    2011?  Marland Kitchen Allergy   . Anemia   . Anxiety   . Asthma    hospitalization 2008, last attack this month  . Chronic nausea   . Depression   . Dysmenorrhea   . Dyspareunia   . Gestational diabetes 07/13/2016   no meds  . Migraines   . PID (pelvic inflammatory disease)   . Wears glasses     Patient Active Problem List   Diagnosis Date Noted  . Yeast infection 01/06/2018  . S/P primary low transverse C-section 10/26/2016  . Normal postpartum course 10/26/2016  . Gestational diabetes mellitus 09/19/2016  . Anemia of mother in pregnancy, antepartum 07/29/2016  . Daytime somnolence 07/11/2015  . Sleep disorder 07/11/2015  . Cephalalgia 07/11/2015  . Asthma, mild intermittent 07/11/2015  . Well woman exam with routine gynecological exam 07/11/2015  . Depressed mood 07/11/2015  . Chronic nausea 07/11/2015  . Changing skin lesion 07/11/2015  . Adjustment disorder with mixed anxiety and depressed mood 07/11/2015  . Hemorrhoids 04/30/2014    Past Surgical History:  Procedure Laterality Date  . CESAREAN SECTION    . CESAREAN SECTION N/A 09/19/2016   Procedure: CESAREAN SECTION;  Surgeon: [place]ka Bing, MD;  Location: [facility] BIRTHING SUITES;  Service: Obstetrics;  Laterality:  N/A;  . implanon     inserted 2010  . INCISE AND DRAIN ABCESS  2019   under arms " boils"  . WISDOM TOOTH EXTRACTION      OB History    Gravida  2   Para  2   Term  2   Preterm      AB      Living  1     SAB      IAB      Ectopic      Multiple      Live Births  1            Home Medications    Prior to Admission medications   Medication Sig Start Date End Date Taking? Authorizing Provider  doxycycline (VIBRAMYCIN) 100 MG capsule Take 1 capsule (100 mg total) by mouth 2 (two) times daily for 10 days. 11/08/20 11/18/20 Yes Hope Holst C, PA-C  ibuprofen (ADVIL) 800 MG tablet Take 1 tablet (800 mg total) by mouth 3 (three) times daily. 11/08/20  Yes Jaimee Corum C, PA-C  ondansetron (ZOFRAN ODT) 4 MG disintegrating tablet Take 1 tablet (4 mg total) by mouth every 8 (eight) hours as needed for nausea or vomiting. 11/08/20  Yes Randy Whitener C, PA-C  albuterol (PROVENTIL) (2.5 MG/3ML) 0.083% nebulizer solution Take 3 mLs (2.5 mg total) by nebulization every 6 (six) hours as needed for wheezing or shortness of  breath. 01/28/20   Muthersbaugh, Dahlia Client, PA-C  albuterol (VENTOLIN HFA) 108 (90 Base) MCG/ACT inhaler Inhale 2 puffs into the lungs every 6 (six) hours as needed for wheezing or shortness of breath. 01/28/20   Muthersbaugh, Dahlia Client, PA-C  pantoprazole (PROTONIX) 40 MG tablet Take 1 tablet (40 mg total) by mouth daily for 14 days. Patient not taking: Reported on 01/28/2020 12/31/18 07/10/20  Cristina Gong, PA-C  sucralfate (CARAFATE) 1 g tablet Take 1 tablet (1 g total) by mouth 4 (four) times daily -  with meals and at bedtime for 14 days. Patient not taking: Reported on 01/28/2020 12/31/18 07/10/20  Cristina Gong, PA-C    Family History Family History  Problem Relation Age of Onset  . Hypertension Mother   . Heart disease Mother        early 70s  . Diabetes Mother   . Diabetes Paternal Grandmother   . Stroke Paternal Grandmother     Social  History Social History   Tobacco Use  . Smoking status: Former Smoker    Quit date: 03/20/2008    Years since quitting: 12.6  . Smokeless tobacco: Never Used  Vaping Use  . Vaping Use: Never used  Substance Use Topics  . Alcohol use: No    Alcohol/week: 0.0 standard drinks  . Drug use: No     Allergies   Patient has no known allergies.   Review of Systems Review of Systems  Constitutional: Negative for fatigue and fever.  HENT: Negative for mouth sores.   Eyes: Negative for visual disturbance.  Respiratory: Negative for shortness of breath.   Cardiovascular: Negative for chest pain.  Gastrointestinal: Negative for abdominal pain, nausea and vomiting.  Genitourinary: Negative for genital sores.  Musculoskeletal: Negative for arthralgias and joint swelling.  Skin: Positive for color change and wound. Negative for rash.  Neurological: Negative for dizziness, weakness, light-headedness and headaches.     Physical Exam Triage Vital Signs ED Triage Vitals  Enc Vitals Group     BP 11/08/20 1822 (!) 140/103     Pulse Rate 11/08/20 1822 87     Resp 11/08/20 1822 18     Temp 11/08/20 1822 98.3 F (36.8 C)     Temp Source 11/08/20 1822 Oral     SpO2 11/08/20 1822 97 %     Weight --      Height --      Head Circumference --      Peak Flow --      Pain Score 11/08/20 1823 5     Pain Loc --      Pain Edu? --      Excl. in GC? --    No data found.  Updated Vital Signs BP (!) 140/103 (BP Location: Left Arm)   Pulse 87   Temp 98.3 F (36.8 C) (Oral)   Resp 18   SpO2 97%   Visual Acuity Right Eye Distance:   Left Eye Distance:   Bilateral Distance:    Right Eye Near:   Left Eye Near:    Bilateral Near:     Physical Exam Vitals and nursing note reviewed.  Constitutional:      Appearance: She is well-developed and well-nourished.     Comments: No acute distress  HENT:     Head: Normocephalic and atraumatic.     Nose: Nose normal.  Eyes:      Conjunctiva/sclera: Conjunctivae normal.  Cardiovascular:     Rate and Rhythm: Normal rate.  Pulmonary:  Effort: Pulmonary effort is normal. No respiratory distress.  Chest:       Comments: Left breast with superficial mobile 1 cm nodule Abdominal:     General: There is no distension.  Musculoskeletal:        General: Normal range of motion.     Cervical back: Neck supple.  Skin:    General: Skin is warm and dry.     Comments: Left axilla with area of induration in various areas with erythema and tenderness, small area with some skin thinning and slight fluctuance  Neurological:     Mental Status: She is alert and oriented to person, place, and time.  Psychiatric:        Mood and Affect: Mood and affect normal.      UC Treatments / Results  Labs (all labs ordered are listed, but only abnormal results are displayed) Labs Reviewed - No data to display  EKG   Radiology No results found.  Procedures Incision and Drainage  Date/Time: 11/08/2020 8:52 PM Performed by: Jatin Naumann, [place] C, PA-C Authorized by: Nykeria Mealing, Junius Creamer, PA-C   Consent:    Consent obtained:  Verbal   Consent given by:  Patient   Risks discussed:  Pain and incomplete drainage   Alternatives discussed:  No treatment Universal protocol:    Patient identity confirmed:  Verbally with patient Location:    Type:  Abscess   Size:  3 cm   Location: left axilla. Anesthesia:    Anesthesia method:  Local infiltration   Local anesthetic:  Lidocaine 2% w/o epi Procedure type:    Complexity:  Simple Procedure details:    Incision types:  Single straight   Incision depth:  Subcutaneous   Wound management:  Probed and deloculated   Drainage:  Bloody and purulent   Drainage amount:  Scant   Wound treatment:  Wound left open   Packing materials:  None Post-procedure details:    Procedure completion:  Tolerated well, no immediate complications   (including critical care time)  Medications Ordered in  UC Medications - No data to display  Initial Impression / Assessment and Plan / UC Course  I have reviewed the triage vital signs and the nursing notes.  Pertinent labs & imaging results that were available during my care of the patient were reviewed by me and considered in my medical decision making (see chart for details).     1. Left axilla abscess-I&D performed with small amount of drainage obtained, initiating on doxycycline, warm compresses with close monitoring  2. Left breast cyst versus adenoma-patient reported increase in size of recently, requesting referral to breast center. Referral placed.  Discussed strict return precautions. Patient verbalized understanding and is agreeable with plan.   Final Clinical Impressions(s) / UC Diagnoses   Final diagnoses:  Abscess of left axilla  Cyst of left breast     Discharge Instructions     Please begin doxycycline for 10 days  Apply warm compresses/hot rags to area with massage to express further drainage especially the first 24-48 hours  Use anti-inflammatories for pain/swelling. You may take up to 800 mg Ibuprofen every 8 hours with food. You may supplement Ibuprofen with Tylenol [phone] mg every 8 hours.    Return if symptoms returning or not improving   ED Prescriptions    Medication Sig Dispense Auth. Provider   doxycycline (VIBRAMYCIN) 100 MG capsule Take 1 capsule (100 mg total) by mouth 2 (two) times daily for 10 days. 20 capsule [place], [place]  C, PA-C   ibuprofen (ADVIL) 800 MG tablet Take 1 tablet (800 mg total) by mouth 3 (three) times daily. 21 tablet Niambi Smoak C, PA-C   ondansetron (ZOFRAN ODT) 4 MG disintegrating tablet Take 1 tablet (4 mg total) by mouth every 8 (eight) hours as needed for nausea or vomiting. 20 tablet [place], [place] C, PA-C     PDMP not reviewed this encounter.   Lew DawesWieters, Nejla Reasor C, [place] JerseyPA-C 11/09/20 [phone]

## 2020-11-11 ENCOUNTER — Other Ambulatory Visit: Payer: Self-pay | Admitting: *Deleted

## 2020-11-11 ENCOUNTER — Other Ambulatory Visit: Payer: Self-pay | Admitting: Emergency Medicine

## 2020-11-11 DIAGNOSIS — N6002 Solitary cyst of left breast: Secondary | ICD-10-CM

## 2020-12-12 LAB — HM PAP SMEAR: HM Pap smear: NEGATIVE

## 2021-01-08 ENCOUNTER — Other Ambulatory Visit: Payer: Self-pay

## 2021-01-08 ENCOUNTER — Emergency Department (HOSPITAL_COMMUNITY)
Admission: EM | Admit: 2021-01-08 | Discharge: 2021-01-08 | Disposition: A | Discharge status: Home / Self Care | Payer: No Typology Code available for payment source | Attending: Emergency Medicine | Admitting: Emergency Medicine

## 2021-01-08 ENCOUNTER — Encounter (HOSPITAL_COMMUNITY): Payer: Self-pay | Admitting: Emergency Medicine

## 2021-01-08 DIAGNOSIS — R531 Weakness: Secondary | ICD-10-CM | POA: Insufficient documentation

## 2021-01-08 DIAGNOSIS — J4520 Mild intermittent asthma, uncomplicated: Secondary | ICD-10-CM | POA: Insufficient documentation

## 2021-01-08 DIAGNOSIS — R197 Diarrhea, unspecified: Secondary | ICD-10-CM | POA: Insufficient documentation

## 2021-01-08 DIAGNOSIS — Z87891 Personal history of nicotine dependence: Secondary | ICD-10-CM | POA: Diagnosis not present

## 2021-01-08 LAB — CBC WITH DIFFERENTIAL/PLATELET
Abs Immature Granulocytes: 0.02 10*3/uL (ref 0.00–0.07)
Basophils Absolute: 0 10*3/uL (ref 0.0–0.1)
Basophils Relative: 0 %
Eosinophils Absolute: 0.5 10*3/uL (ref 0.0–0.5)
Eosinophils Relative: 8 %
HCT: 35.8 % — ABNORMAL LOW (ref 36.0–46.0)
Hemoglobin: 11.3 g/dL — ABNORMAL LOW (ref 12.0–15.0)
Immature Granulocytes: 0 %
Lymphocytes Relative: 29 %
Lymphs Abs: 1.8 10*3/uL (ref 0.7–4.0)
MCH: 25.5 pg — ABNORMAL LOW (ref 26.0–34.0)
MCHC: 31.6 g/dL (ref 30.0–36.0)
MCV: 80.8 fL (ref 80.0–100.0)
Monocytes Absolute: 0.8 10*3/uL (ref 0.1–1.0)
Monocytes Relative: 13 %
Neutro Abs: 3 10*3/uL (ref 1.7–7.7)
Neutrophils Relative %: 50 %
Platelets: 245 10*3/uL (ref 150–400)
RBC: 4.43 MIL/uL (ref 3.87–5.11)
RDW: 14.9 % (ref 11.5–15.5)
WBC: 6.1 10*3/uL (ref 4.0–10.5)
nRBC: 0 % (ref 0.0–0.2)

## 2021-01-08 LAB — URINALYSIS, ROUTINE W REFLEX MICROSCOPIC
Bacteria, UA: NONE SEEN
Bilirubin Urine: NEGATIVE
Glucose, UA: NEGATIVE mg/dL
Ketones, ur: NEGATIVE mg/dL
Leukocytes,Ua: NEGATIVE
Nitrite: NEGATIVE
Protein, ur: 30 mg/dL — AB
Specific Gravity, Urine: 1.016 (ref 1.005–1.030)
pH: 6 (ref 5.0–8.0)

## 2021-01-08 LAB — COMPREHENSIVE METABOLIC PANEL
ALT: 19 U/L (ref 0–44)
AST: 18 U/L (ref 15–41)
Albumin: 3.8 g/dL (ref 3.5–5.0)
Alkaline Phosphatase: 49 U/L (ref 38–126)
Anion gap: 6 (ref 5–15)
BUN: 9 mg/dL (ref 6–20)
CO2: 24 mmol/L (ref 22–32)
Calcium: 9 mg/dL (ref 8.9–10.3)
Chloride: 107 mmol/L (ref 98–111)
Creatinine, Ser: 0.7 mg/dL (ref 0.44–1.00)
GFR, Estimated: >60 mL/min (ref >60)
Glucose, Bld: 100 mg/dL — ABNORMAL HIGH (ref 70–99)
Potassium: 3.8 mmol/L (ref 3.5–5.1)
Sodium: 137 mmol/L (ref 135–145)
Total Bilirubin: 0.5 mg/dL (ref 0.3–1.2)
Total Protein: 7.6 g/dL (ref 6.5–8.1)

## 2021-01-08 LAB — PREGNANCY, URINE: Preg Test, Ur: NEGATIVE

## 2021-01-08 MED ORDER — LOPERAMIDE HCL 2 MG PO CAPS: 2.0000 mg | ORAL_CAPSULE | Freq: Four times a day (QID) | ORAL | 0 refills | Status: DC | PRN

## 2021-01-08 MED ORDER — LOPERAMIDE HCL 2 MG PO CAPS
4.0000 mg | ORAL_CAPSULE | Freq: Once | ORAL | Status: AC
  Administered 2021-01-08: 4 mg via ORAL
  Filled 2021-01-08: qty 2

## 2021-01-08 MED ORDER — SODIUM CHLORIDE 0.9 % IV BOLUS
1000.0000 mL | Freq: Once | INTRAVENOUS | Status: AC
  Administered 2021-01-08: 1000 mL via INTRAVENOUS

## 2021-01-08 NOTE — ED Triage Notes (Signed)
Patient endorses 'sour stomach since Sunday, states she has had diarrhea, nausea, fatigue and headache.

## 2021-01-08 NOTE — Discharge Instructions (Signed)
Call your primary care doctor or specialist as discussed in the next 2-3 days.   Return immediately back to the ER if:  Your symptoms worsen within the next 12-24 hours. You develop new symptoms such as new fevers, persistent vomiting, new pain, shortness of breath, or new weakness or numbness, or if you have any other concerns.  

## 2021-01-08 NOTE — ED Notes (Addendum)
Pt is unable to provide a urine sample at this time.

## 2021-01-08 NOTE — ED Provider Notes (Signed)
[facility]-EMERGENCY DEPT Provider Note   CSN: [id number] Arrival date & time: 01/08/21  4656     History No chief complaint on file.   Vanessa Foster is a 35 y.o. female.  Patient presents chief complaint of diarrhea, ongoing for the past 4 days.  Describes it as more than 5-10 episodes a day, nonbloody.  Denies vomiting.  Denies fevers or cough.  Denies any recent travel or antibiotic use.  Has not tried any medication for her diarrhea at home.  Also complaining of diffuse abdominal pain intermittently described as crampy, however no abdominal pain at this time.        Past Medical History:  Diagnosis Date  . Abnormal Pap smear of cervix    2011?  Marland Kitchen Allergy   . Anemia   . Anxiety   . Asthma    hospitalization 2008, last attack this month  . Chronic nausea   . Depression   . Dysmenorrhea   . Dyspareunia   . Gestational diabetes 07/13/2016   no meds  . Migraines   . PID (pelvic inflammatory disease)   . Wears glasses     Patient Active Problem List   Diagnosis Date Noted  . Yeast infection 01/06/2018  . S/P primary low transverse C-section 10/26/2016  . Normal postpartum course 10/26/2016  . Gestational diabetes mellitus 09/19/2016  . Anemia of mother in pregnancy, antepartum 07/29/2016  . Daytime somnolence 07/11/2015  . Sleep disorder 07/11/2015  . Cephalalgia 07/11/2015  . Asthma, mild intermittent 07/11/2015  . Well woman exam with routine gynecological exam 07/11/2015  . Depressed mood 07/11/2015  . Chronic nausea 07/11/2015  . Changing skin lesion 07/11/2015  . Adjustment disorder with mixed anxiety and depressed mood 07/11/2015  . Hemorrhoids 04/30/2014    Past Surgical History:  Procedure Laterality Date  . CESAREAN SECTION    . CESAREAN SECTION N/A 09/19/2016   Procedure: CESAREAN SECTION;  Surgeon: [place] Bing, MD;  Location: [facility] BIRTHING SUITES;  Service: Obstetrics;  Laterality: N/A;  . implanon     inserted  2010  . INCISE AND DRAIN ABCESS  2019   under arms " boils"  . WISDOM TOOTH EXTRACTION       OB History    Gravida  2   Para  2   Term  2   Preterm      AB      Living  1     SAB      IAB      Ectopic      Multiple      Live Births  1           Family History  Problem Relation Age of Onset  . Hypertension Mother   . Heart disease Mother        early 47s  . Diabetes Mother   . Diabetes Paternal Grandmother   . Stroke Paternal Grandmother     Social History   Tobacco Use  . Smoking status: Former Smoker    Quit date: 03/20/2008    Years since quitting: 12.8  . Smokeless tobacco: Never Used  Vaping Use  . Vaping Use: Never used  Substance Use Topics  . Alcohol use: No    Alcohol/week: 0.0 standard drinks  . Drug use: No    Home Medications Prior to Admission medications   Medication Sig Start Date End Date Taking? Authorizing Provider  acetaminophen (TYLENOL) 500 MG tablet Take 1,000 mg by mouth every 6 (  six) hours as needed for mild pain.   Yes [provider]  guaiFENesin (MUCINEX) 600 MG 12 hr tablet Take 600 mg by mouth 2 (two) times daily as needed for to loosen phlegm.   Yes [provider]  ibuprofen (ADVIL) 800 MG tablet Take 1 tablet (800 mg total) by mouth 3 (three) times daily. Patient taking differently: Take 800 mg by mouth every 8 (eight) hours as needed for headache or mild pain. 11/08/20  Yes Wieters, Hallie C, PA-C  loperamide (IMODIUM) 2 MG capsule Take 1 capsule (2 mg total) by mouth 4 (four) times daily as needed for up to 8 doses for diarrhea or loose stools. 01/08/21  Yes Cheryll Cockayne, MD  norethindrone (MICRONOR) 0.35 MG tablet Take 1 tablet by mouth daily. 12/12/20  Yes [provider]  ondansetron (ZOFRAN ODT) 4 MG disintegrating tablet Take 1 tablet (4 mg total) by mouth every 8 (eight) hours as needed for nausea or vomiting. 11/08/20  Yes Wieters, Hallie C, PA-C  polyethylene glycol (MIRALAX /  GLYCOLAX) 17 g packet Take 17 g by mouth daily as needed for mild constipation.   Yes [provider]  simethicone (MYLICON) 80 MG chewable tablet Chew 80 mg by mouth every 6 (six) hours as needed for flatulence.   Yes [provider]  albuterol (PROVENTIL) (2.5 MG/3ML) 0.083% nebulizer solution Take 3 mLs (2.5 mg total) by nebulization every 6 (six) hours as needed for wheezing or shortness of breath. Patient not taking: Reported on 01/08/2021 01/28/20   Muthersbaugh, Dahlia Client, PA-C  albuterol (VENTOLIN HFA) 108 (90 Base) MCG/ACT inhaler Inhale 2 puffs into the lungs every 6 (six) hours as needed for wheezing or shortness of breath. 01/28/20   Muthersbaugh, Dahlia Client, PA-C  pantoprazole (PROTONIX) 40 MG tablet Take 1 tablet (40 mg total) by mouth daily for 14 days. Patient not taking: Reported on 01/28/2020 12/31/18 07/10/20  Cristina Gong, PA-C  sucralfate (CARAFATE) 1 g tablet Take 1 tablet (1 g total) by mouth 4 (four) times daily -  with meals and at bedtime for 14 days. Patient not taking: Reported on 01/28/2020 12/31/18 07/10/20  Cristina Gong, PA-C    Allergies    Patient has no known allergies.  Review of Systems   Review of Systems  Constitutional: Negative for fever.  HENT: Negative for ear pain.   Eyes: Negative for pain.  Respiratory: Negative for cough.   Cardiovascular: Negative for chest pain.  Gastrointestinal: Positive for diarrhea.  Genitourinary: Negative for flank pain.  Musculoskeletal: Negative for back pain.  Skin: Negative for rash.  Neurological: Negative for headaches.    Physical Exam Updated Vital Signs BP (!) 163/113 (BP Location: Left Arm)   Pulse 83   Temp 98 F (36.7 C) (Oral)   Resp 18   Ht 5\' 2"  (1.575 m)   Wt 104.3 kg   LMP 01/02/2021   SpO2 96%   BMI 42.07 kg/m   Physical Exam Constitutional:      General: She is not in acute distress.    Appearance: Normal appearance.  HENT:     Head: Normocephalic.     Nose: Nose  normal.  Eyes:     Extraocular Movements: Extraocular movements intact.  Cardiovascular:     Rate and Rhythm: Normal rate.  Pulmonary:     Effort: Pulmonary effort is normal.  Abdominal:     General: Abdomen is flat. There is no distension.     Tenderness: There is no abdominal  tenderness.  Musculoskeletal:        General: Normal range of motion.     Cervical back: Normal range of motion.  Neurological:     General: No focal deficit present.     Mental Status: She is alert. Mental status is at baseline.     ED Results / Procedures / Treatments   Labs (all labs ordered are listed, but only abnormal results are displayed) Labs Reviewed  CBC WITH DIFFERENTIAL/PLATELET - Abnormal; Notable for the following components:      Result Value   Hemoglobin 11.3 (*)    HCT 35.8 (*)    MCH 25.5 (*)    All other components within normal limits  COMPREHENSIVE METABOLIC PANEL - Abnormal; Notable for the following components:   Glucose, Bld 100 (*)    All other components within normal limits  URINALYSIS, ROUTINE W REFLEX MICROSCOPIC - Abnormal; Notable for the following components:   Hgb urine dipstick MODERATE (*)    Protein, ur 30 (*)    All other components within normal limits  PREGNANCY, URINE    EKG None  Radiology No results found.  Procedures Procedures   Medications Ordered in ED Medications  loperamide (IMODIUM) capsule 4 mg (4 mg Oral Given 01/08/21 0957)  sodium chloride 0.9 % bolus 1,000 mL (1,000 mLs Intravenous New Bag/Given 01/08/21 3903)    ED Course  I have reviewed the triage vital signs and the nursing notes.  Pertinent labs & imaging results that were available during my care of the patient were reviewed by me and considered in my medical decision making (see chart for details).    MDM Rules/Calculators/A&P                          Abdominal exam is benign here today.  No guarding or rebound tenderness noted.  Vital signs within normal  limits.  Patient has generalized weakness on exam no focal deficit noted.  Abdominal exam is benign.  Labs returned unremarkable.  Patient given IV fluid hydration here is unremarkable.  Will recommend continued oral hydration at home and follow-up with her doctor in 3 to 4 days.  Advised immediate return if she has worsening symptoms fevers persistent pain or any additional concerns.   Final Clinical Impression(s) / ED Diagnoses Final diagnoses:  Diarrhea, unspecified type    Rx / DC Orders ED Discharge Orders         Ordered    loperamide (IMODIUM) 2 MG capsule  4 times daily PRN        01/08/21 1252           Cheryll Cockayne, MD 01/08/21 1253

## 2021-01-09 ENCOUNTER — Ambulatory Visit (INDEPENDENT_AMBULATORY_CARE_PROVIDER_SITE_OTHER): Payer: No Typology Code available for payment source | Admitting: Family Medicine

## 2021-01-09 ENCOUNTER — Encounter: Payer: Self-pay | Admitting: Family Medicine

## 2021-01-09 VITALS — BP 140/82 | HR 85 | Wt 229.6 lb

## 2021-01-09 DIAGNOSIS — R519 Headache, unspecified: Secondary | ICD-10-CM

## 2021-01-09 DIAGNOSIS — Z833 Family history of diabetes mellitus: Secondary | ICD-10-CM

## 2021-01-09 DIAGNOSIS — J4520 Mild intermittent asthma, uncomplicated: Secondary | ICD-10-CM | POA: Diagnosis not present

## 2021-01-09 DIAGNOSIS — Z8632 Personal history of gestational diabetes: Secondary | ICD-10-CM | POA: Diagnosis not present

## 2021-01-09 DIAGNOSIS — Z23 Encounter for immunization: Secondary | ICD-10-CM

## 2021-01-09 MED ORDER — ALBUTEROL SULFATE HFA 108 (90 BASE) MCG/ACT IN AERS: 2.0000 | INHALATION_SPRAY | Freq: Four times a day (QID) | RESPIRATORY_TRACT | 0 refills | Status: DC | PRN

## 2021-01-09 NOTE — Progress Notes (Signed)
   Subjective:    Patient ID: Vanessa Foster, female    DOB: 1985/10/03, 35 y.o.   MRN: [id number]  HPI Chief Complaint  Patient presents with  . new pt get established    New pt get established. Had stomach virus over the weekend, feeling better   She is new to the practice and here to establish care.  Previous medical care:  Other providers: OB/GYN  Eyes- [employer] contact lenses   Lump on left breast and is scheduled for a mammogram next week.   Asthma- diagnosed in 6 th grade. Allergies and takes Zyrtec as needed States she has been using her child's albuterol and does not have an inhaler.  She has occasional flareups.  States she has 4-5 headaches per week. Pain is posterior. Sometimes she wakes up with it.  Takes ibuprofen but it does not help.  Marijuana helps.  Headache specialist 5 years ago.    Hx of gestational diabetes.  Family history of diabetes in grandparents.  States she thinks she has had diabetes outside of being pregnant but cannot recall when.  She would like to be checked for diabetes today.  History of Covid in June 2021    Social history: Lives with spouse, 2 kids ages 32 and 66, works at [employer] at [employer], sport and exercise on urology floor    Immunizations: requests [employer]   Reviewed allergies, medications, past medical, surgical, family, and social history.    Review of Systems Pertinent positives and negatives in the history of present illness.     Objective:   Physical Exam BP 140/82   Pulse 85   Wt 229 lb 9.6 oz (104.1 kg)   LMP 01/02/2021   SpO2 97%   BMI 41.99 kg/m   Alert and in no distress. Cardiac exam shows a regular sinus rhythm without murmurs or gallops. Lungs are clear to auscultation.  Extremities without edema.  Skin is warm and dry.  Normal speech, mood and memory.       Assessment & Plan:  Mild intermittent asthma without complication - Plan: albuterol (VENTOLIN HFA) 108 (90 Base) MCG/ACT  inhaler -Albuterol inhaler prescribed.  Counseling on controlling allergies and asthma flareups.  Discussed if she is needing albuterol more than twice weekly that we will need to treat her with a daily maintenance inhaler.  Intermittent headache -She will keep a headache diary and follow-up.  Discussed importance of regular sleep and hydration.  Recommend avoiding fluctuations in caffeine and eating.  History of gestational diabetes - Plan: CBC with Differential/Platelet, Comprehensive metabolic panel, TSH, Hemoglobin A1c -Recommend low sugar, low-carb diet.  Follow-up pending lab results.  Morbid obesity (HCC) - Plan: CBC with Differential/Platelet, Comprehensive metabolic panel, TSH, Hemoglobin A1c -Recommend healthy, well-balanced diet and increasing physical activity for weight loss.  Family history of diabetes mellitus - Plan: Hemoglobin A1c -Discussed that she is at increased risk of diabetes due to family history.

## 2021-01-10 LAB — CBC WITH DIFFERENTIAL/PLATELET
Basophils Absolute: 0 10*3/uL (ref 0.0–0.2)
Basos: 1 %
EOS (ABSOLUTE): 0.4 10*3/uL (ref 0.0–0.4)
Eos: 7 %
Hematocrit: 38.9 % (ref 34.0–46.6)
Hemoglobin: 12 g/dL (ref 11.1–15.9)
Immature Grans (Abs): 0 10*3/uL (ref 0.0–0.1)
Immature Granulocytes: 1 %
Lymphocytes Absolute: 2.3 10*3/uL (ref 0.7–3.1)
Lymphs: 39 %
MCH: 24.2 pg — ABNORMAL LOW (ref 26.6–33.0)
MCHC: 30.8 g/dL — ABNORMAL LOW (ref 31.5–35.7)
MCV: 78 fL — ABNORMAL LOW (ref 79–97)
Monocytes Absolute: 0.6 10*3/uL (ref 0.1–0.9)
Monocytes: 10 %
Neutrophils Absolute: 2.5 10*3/uL (ref 1.4–7.0)
Neutrophils: 42 %
Platelets: 288 10*3/uL (ref 150–450)
RBC: 4.96 x10E6/uL (ref 3.77–5.28)
RDW: 14.5 % (ref 11.7–15.4)
WBC: 5.8 10*3/uL (ref 3.4–10.8)

## 2021-01-10 LAB — COMPREHENSIVE METABOLIC PANEL
ALT: 16 IU/L (ref 0–32)
AST: 14 IU/L (ref 0–40)
Albumin/Globulin Ratio: 1.3 (ref 1.2–2.2)
Albumin: 4.3 g/dL (ref 3.8–4.8)
Alkaline Phosphatase: 63 IU/L (ref 44–121)
BUN/Creatinine Ratio: 13 (ref 9–23)
BUN: 9 mg/dL (ref 6–20)
Bilirubin Total: 0.3 mg/dL (ref 0.0–1.2)
CO2: 21 mmol/L (ref 20–29)
Calcium: 9.1 mg/dL (ref 8.7–10.2)
Chloride: 103 mmol/L (ref 96–106)
Creatinine, Ser: 0.71 mg/dL (ref 0.57–1.00)
Globulin, Total: 3.2 g/dL (ref 1.5–4.5)
Glucose: 95 mg/dL (ref 65–99)
Potassium: 4.2 mmol/L (ref 3.5–5.2)
Sodium: 138 mmol/L (ref 134–144)
Total Protein: 7.5 g/dL (ref 6.0–8.5)
eGFR: 116 mL/min/{1.73_m2} (ref >59)

## 2021-01-10 LAB — HEMOGLOBIN A1C
Est. average glucose Bld gHb Est-mCnc: 126 mg/dL
Hgb A1c MFr Bld: 6 % — ABNORMAL HIGH (ref 4.8–5.6)

## 2021-01-10 LAB — TSH: TSH: 1.43 u[IU]/mL (ref 0.450–4.500)

## 2021-01-17 ENCOUNTER — Other Ambulatory Visit: Payer: Self-pay

## 2021-02-21 ENCOUNTER — Other Ambulatory Visit: Payer: Self-pay

## 2021-02-21 ENCOUNTER — Encounter: Payer: Self-pay | Admitting: Family Medicine

## 2021-02-21 ENCOUNTER — Ambulatory Visit (INDEPENDENT_AMBULATORY_CARE_PROVIDER_SITE_OTHER): Payer: No Typology Code available for payment source | Admitting: Family Medicine

## 2021-02-21 VITALS — BP 124/88 | HR 80 | Ht 63.5 in | Wt 230.6 lb

## 2021-02-21 DIAGNOSIS — E049 Nontoxic goiter, unspecified: Secondary | ICD-10-CM

## 2021-02-21 DIAGNOSIS — Z7185 Encounter for immunization safety counseling: Secondary | ICD-10-CM

## 2021-02-21 DIAGNOSIS — J4520 Mild intermittent asthma, uncomplicated: Secondary | ICD-10-CM

## 2021-02-21 DIAGNOSIS — L02214 Cutaneous abscess of groin: Secondary | ICD-10-CM | POA: Insufficient documentation

## 2021-02-21 DIAGNOSIS — Z114 Encounter for screening for human immunodeficiency virus [HIV]: Secondary | ICD-10-CM

## 2021-02-21 DIAGNOSIS — R809 Proteinuria, unspecified: Secondary | ICD-10-CM

## 2021-02-21 DIAGNOSIS — Z1159 Encounter for screening for other viral diseases: Secondary | ICD-10-CM

## 2021-02-21 DIAGNOSIS — R7303 Prediabetes: Secondary | ICD-10-CM | POA: Diagnosis not present

## 2021-02-21 DIAGNOSIS — Z0000 Encounter for general adult medical examination without abnormal findings: Secondary | ICD-10-CM

## 2021-02-21 DIAGNOSIS — L732 Hidradenitis suppurativa: Secondary | ICD-10-CM

## 2021-02-21 DIAGNOSIS — K5900 Constipation, unspecified: Secondary | ICD-10-CM

## 2021-02-21 DIAGNOSIS — L0293 Carbuncle, unspecified: Secondary | ICD-10-CM

## 2021-02-21 DIAGNOSIS — L304 Erythema intertrigo: Secondary | ICD-10-CM | POA: Insufficient documentation

## 2021-02-21 HISTORY — DX: Hidradenitis suppurativa: L73.2

## 2021-02-21 LAB — POCT URINALYSIS DIP (PROADVANTAGE DEVICE)
Bilirubin, UA: NEGATIVE
Glucose, UA: NEGATIVE mg/dL
Ketones, POC UA: NEGATIVE mg/dL
Leukocytes, UA: NEGATIVE
Nitrite, UA: NEGATIVE
Specific Gravity, Urine: 1.02
Urobilinogen, Ur: NEGATIVE
pH, UA: 6 (ref 5.0–8.0)

## 2021-02-21 NOTE — Patient Instructions (Addendum)
Try over the counter Lamsil twice daily and keep the area under your breasts as dry as possible.   Switch your soap to Dial or Lever 2000 Use warm compresses to the areas under your arms and in your groin. Keep them dry  Call and schedule with a dermatologist.   Let me know if you develop fever, chills, or signs of infection.   Cut back on sugar and carbohydrates and try to get at least 150 minutes of physical activity each week.   Dermatology offices  [facility]: Phone #: [phone] Address: [address], [place], [place] [address]  [facility]: Phone: [phone]  Address: [address] [place], [place] [address]  [facility]: [phone] Address: [address] [place], [place] [address]  [facility] Address: [place], [place], [place] [address] Phone: [phone]    Preventive Care 40-35 Years Old, Female Preventive care refers to lifestyle choices and visits with your health care provider that can promote health and wellness. This includes:  A yearly physical exam. This is also called an annual wellness visit.  Regular dental and eye exams.  Immunizations.  Screening for certain conditions.  Healthy lifestyle choices, such as: ? Eating a healthy diet. ? Getting regular exercise. ? Not using drugs or products that contain nicotine and tobacco. ? Limiting alcohol use. What can I expect for my preventive care visit? Physical exam Your health care provider may check your:  Height and weight. These may be used to calculate your BMI (body mass index). BMI is a measurement that tells if you are at a healthy weight.  Heart rate and blood pressure.  Body temperature.  Skin for abnormal spots. Counseling Your health care provider may ask you questions about your:  Past medical problems.  Family's medical history.  Alcohol, tobacco, and drug use.  Emotional well-being.  Home life and  relationship well-being.  Sexual activity.  Diet, exercise, and sleep habits.  Work and work Statistician.  Access to firearms.  Method of birth control.  Menstrual cycle.  Pregnancy history. What immunizations do I need? Vaccines are usually given at various ages, according to a schedule. Your health care provider will recommend vaccines for you based on your age, medical history, and lifestyle or other factors, such as travel or where you work.   What tests do I need? Blood tests  Lipid and cholesterol levels. These may be checked every 5 years starting at age 47.  Hepatitis C test.  Hepatitis B test. Screening  Diabetes screening. This is done by checking your blood sugar (glucose) after you have not eaten for a while (fasting).  STD (sexually transmitted disease) testing, if you are at risk.  BRCA-related cancer screening. This may be done if you have a family history of breast, ovarian, tubal, or peritoneal cancers.  Pelvic exam and Pap test. This may be done every 3 years starting at age 35. Starting at age 20, this may be done every 5 years if you have a Pap test in combination with an HPV test. Talk with your health care provider about your test results, treatment options, and if necessary, the need for more tests.   Follow these instructions at home: Eating and drinking  Eat a healthy diet that includes fresh fruits and vegetables, whole grains, lean protein, and low-fat dairy products.  Take vitamin and mineral supplements as recommended by your health care provider.  Do not drink alcohol if: ? Your health care provider tells  you not to drink. ? You are pregnant, may be pregnant, or are planning to become pregnant.  If you drink alcohol: ? Limit how much you have to 0-1 drink a day. ? Be aware of how much alcohol is in your drink. In the [place] one drink equals one 12 oz bottle of beer (355 mL), one 5 oz glass of wine (148 mL), or one 1 oz glass of hard liquor  (44 mL).   Lifestyle  Take daily care of your teeth and gums. Brush your teeth every morning and night with fluoride toothpaste. Floss one time each day.  Stay active. Exercise for at least 30 minutes 5 or more days each week.  Do not use any products that contain nicotine or tobacco, such as cigarettes, e-cigarettes, and chewing tobacco. If you need help quitting, ask your health care provider.  Do not use drugs.  If you are sexually active, practice safe sex. Use a condom or other form of protection to prevent STIs (sexually transmitted infections).  If you do not wish to become pregnant, use a form of birth control. If you plan to become pregnant, see your health care provider for a prepregnancy visit.  Find healthy ways to cope with stress, such as: ? Meditation, yoga, or listening to music. ? Journaling. ? Talking to a trusted person. ? Spending time with friends and family. Safety  Always wear your seat belt while driving or riding in a vehicle.  Do not drive: ? If you have been drinking alcohol. Do not ride with someone who has been drinking. ? When you are tired or distracted. ? While texting.  Wear a helmet and other protective equipment during sports activities.  If you have firearms in your house, make sure you follow all gun safety procedures.  Seek help if you have been physically or sexually abused. What's next?  Go to your health care provider once a year for an annual wellness visit.  Ask your health care provider how often you should have your eyes and teeth checked.  Stay up to date on all vaccines. This information is not intended to replace advice given to you by your health care provider. Make sure you discuss any questions you have with your health care provider. Document Revised: 05/05/2020 Document Reviewed: 05/19/2018 Elsevier Patient Education  2021 [employer].

## 2021-02-21 NOTE — Progress Notes (Signed)
Subjective:    Patient ID: Vanessa Foster, female    DOB: Jul 17, 1986, 35 y.o.   MRN: [id number]  HPI Chief Complaint  Patient presents with  . fasting cpe     Fasting cpe, yeast under and above breasts when sweating and itches. Boils under arms and private parts.    She is here for a complete physical exam and with  Last CPE: years ago   Other providers: OB/GYN  Eyes- [employer] contact lenses   Asthma- diagnosed in 6 th grade. Allergies and takes Zyrtec as needed States she has been using her child's albuterol and does not have an inhaler.  She has occasional flareups.  Prediabetes with hx of gestations diabetes.   Recurrent boils under arms and in groin. States she has issues every month.    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   Reviewed allergies, medications, past medical, surgical, family, and social history.   Review of Systems Review of Systems Constitutional: -fever, -chills, -sweats, -unexpected weight change,-fatigue ENT: -runny nose, -ear pain, -sore throat Cardiology:  -chest pain, -palpitations, -edema Respiratory: -cough, -shortness of breath, -wheezing Gastroenterology: -abdominal pain, -nausea, -vomiting, -diarrhea, -constipation  Hematology: -bleeding or bruising problems Musculoskeletal: -arthralgias, -myalgias, -joint swelling, -back pain Ophthalmology: -vision changes Urology: -dysuria, -difficulty urinating, -hematuria, -urinary frequency, -urgency Neurology: -headache, -weakness, -tingling, -numbness        Objective:   Physical Exam BP 124/88   Pulse 80   Ht 5' 3.5" (1.613 m)   Wt 230 lb 9.6 oz (104.6 kg)   LMP 02/07/2021   BMI 40.21 kg/m   General Appearance:    Alert, cooperative, no distress, appears stated age  Head:    Normocephalic, without obvious abnormality, atraumatic  Eyes:    PERRL, conjunctiva/corneas clear, EOM's intact  Ears:    Normal TM's and external ear canals  Nose:   Mask on  Throat:   Mask on   Neck:   Supple, no lymphadenopathy;  Thyroid enlarged without any palpable nodule.   Back:    Spine nontender, no curvature, ROM normal, no CVA     tenderness  Lungs:     Clear to auscultation bilaterally without wheezes, rales or     ronchi; respirations unlabored  Chest Wall:    No tenderness or deformity   Heart:    Regular rate and rhythm, S1 and S2 normal, no murmur, rub   or gallop  Breast Exam:   bilateral firm areas without fluctuance. No drainage or surrounding erythema   Abdomen:     Soft, non-tender, nondistended, normoactive bowel sounds,    no masses, no hepatosplenomegaly  Genitalia:    Not done. Right groin with a small, pea sized firm area without induration or fluctuance.   Rectal:    Not performed due to age<40 and no related complaints  Extremities:   No clubbing, cyanosis or edema  Pulses:   2+ and symmetric all extremities  Skin:   Skin color, texture, turgor normal. Mildly erythematous between breasts and in folds beneath breasts  Lymph nodes:   Cervical, supraclavicular, and axillary nodes normal  Neurologic:   CNII-XII intact, normal strength, sensation and gait          Psych:   Normal mood, affect, hygiene and grooming.         Assessment & Plan:  Routine general medical examination at a health care facility - Plan: CBC with Differential/Platelet, Comprehensive metabolic panel, Lipid panel, POCT Urinalysis DIP (Proadvantage Device) -Preventive health care reviewed.  She sees OB/GYN.  Counseling on healthy lifestyle including diet and  exercise.  Recommend regular dental and eye exams.  Immunizations reviewed.  Discussed safety and health promotion.  Mild intermittent asthma without complication -Controlled  Morbid obesity (HCC) - Plan: TSH, T4, free, T3, Lipid panel -Recommend healthy diet and specifically cutting back on carbohydrates, calories and eating before bed.  Increase physical activity.  Enlarged thyroid gland - Plan: TSH, T4, free, T3 -No palpable masses.  Asymptomatic.  Check thyroid panel and follow-up.  Discussed the possibility of ultrasound in the future.  Prediabetes - Plan: Comprehensive metabolic panel, TSH, T4, free, T3 -Recommend low sugar, low carbohydrate diet and increasing physical activity to prevent this from worsening.  Recurrent boils - Plan: CBC with Differential/Platelet -No need for antibiotic at this time.  Discussed switching from her current Oil of Olay soap to Dial or Lever 2000.  She will call and schedule with a dermatologist.  Hidradenitis suppurativa of left axilla - Plan: CBC with Differential/Platelet -Recommend warm compresses and switching from her current soap which is orally to Dial or Lever 2000.  No antibiotic needed currently.  She will call and schedule with her dermatologist and is aware that she may need to see a general surgeon.  Pruritic intertrigo -she will use OTC antifungal such as Lamisil bid x 4-6 weeks. Discussed keeping the area as dry as possible and avoid wearing a bra as often as possible.   Abscess of right groin - Plan: CBC with Differential/Platelet -use warm compresses and antibacterial soap going forward. Keep area clean and dry. Follow up if any signs of infection   Screening for HIV without presence of risk factors - Plan: HIV Antibody (routine testing w rflx) -done per guidelines   Need for hepatitis C screening test - Plan: Hepatitis C antibody -done per guidelines   Immunization counseling  Constipation, unspecified constipation  type  Proteinuria, unspecified type -she is aware of the need to follow up for repeat urinalysis in 4 weeks

## 2021-02-22 LAB — CBC WITH DIFFERENTIAL/PLATELET
Basophils Absolute: 0.1 10*3/uL (ref 0.0–0.2)
Basos: 1 %
EOS (ABSOLUTE): 0.5 10*3/uL — ABNORMAL HIGH (ref 0.0–0.4)
Eos: 6 %
Hematocrit: 37 % (ref 34.0–46.6)
Hemoglobin: 11.8 g/dL (ref 11.1–15.9)
Immature Grans (Abs): 0 10*3/uL (ref 0.0–0.1)
Immature Granulocytes: 0 %
Lymphocytes Absolute: 2.7 10*3/uL (ref 0.7–3.1)
Lymphs: 34 %
MCH: 24.8 pg — ABNORMAL LOW (ref 26.6–33.0)
MCHC: 31.9 g/dL (ref 31.5–35.7)
MCV: 78 fL — ABNORMAL LOW (ref 79–97)
Monocytes Absolute: 0.6 10*3/uL (ref 0.1–0.9)
Monocytes: 8 %
Neutrophils Absolute: 4.2 10*3/uL (ref 1.4–7.0)
Neutrophils: 51 %
Platelets: 275 10*3/uL (ref 150–450)
RBC: 4.75 x10E6/uL (ref 3.77–5.28)
RDW: 14.5 % (ref 11.7–15.4)
WBC: 8 10*3/uL (ref 3.4–10.8)

## 2021-02-22 LAB — COMPREHENSIVE METABOLIC PANEL
ALT: 15 IU/L (ref 0–32)
AST: 13 IU/L (ref 0–40)
Albumin/Globulin Ratio: 1.4 (ref 1.2–2.2)
Albumin: 4.3 g/dL (ref 3.8–4.8)
Alkaline Phosphatase: 60 IU/L (ref 44–121)
BUN/Creatinine Ratio: 9 (ref 9–23)
BUN: 6 mg/dL (ref 6–20)
Bilirubin Total: 0.3 mg/dL (ref 0.0–1.2)
CO2: 19 mmol/L — ABNORMAL LOW (ref 20–29)
Calcium: 9 mg/dL (ref 8.7–10.2)
Chloride: 105 mmol/L (ref 96–106)
Creatinine, Ser: 0.64 mg/dL (ref 0.57–1.00)
Globulin, Total: 3.1 g/dL (ref 1.5–4.5)
Glucose: 98 mg/dL (ref 65–99)
Potassium: 4.1 mmol/L (ref 3.5–5.2)
Sodium: 139 mmol/L (ref 134–144)
Total Protein: 7.4 g/dL (ref 6.0–8.5)
eGFR: 120 mL/min/{1.73_m2} (ref >59)

## 2021-02-22 LAB — LIPID PANEL
Chol/HDL Ratio: 4.6 ratio — ABNORMAL HIGH (ref 0.0–4.4)
Cholesterol, Total: 160 mg/dL (ref 100–199)
HDL: 35 mg/dL — ABNORMAL LOW (ref >39)
LDL Chol Calc (NIH): 108 mg/dL — ABNORMAL HIGH (ref 0–99)
Triglycerides: 93 mg/dL (ref 0–149)
VLDL Cholesterol Cal: 17 mg/dL (ref 5–40)

## 2021-02-22 LAB — HIV ANTIBODY (ROUTINE TESTING W REFLEX): HIV Screen 4th Generation wRfx: NONREACTIVE

## 2021-02-22 LAB — TSH: TSH: 1.88 u[IU]/mL (ref 0.450–4.500)

## 2021-02-22 LAB — T4, FREE: Free T4: 1.18 ng/dL (ref 0.82–1.77)

## 2021-02-22 LAB — HEPATITIS C ANTIBODY: Hep C Virus Ab: <0.1 s/co ratio (ref 0.0–0.9)

## 2021-02-22 LAB — T3: T3, Total: 100 ng/dL (ref 71–180)

## 2021-02-23 ENCOUNTER — Encounter: Payer: Self-pay | Admitting: Family Medicine

## 2021-02-23 DIAGNOSIS — E782 Mixed hyperlipidemia: Secondary | ICD-10-CM | POA: Insufficient documentation

## 2021-02-23 HISTORY — DX: Mixed hyperlipidemia: E78.2

## 2021-02-28 NOTE — Progress Notes (Signed)
Please make sure she received her results and my recommendation

## 2021-03-10 ENCOUNTER — Ambulatory Visit (INDEPENDENT_AMBULATORY_CARE_PROVIDER_SITE_OTHER): Payer: No Typology Code available for payment source | Admitting: Family Medicine

## 2021-03-10 ENCOUNTER — Other Ambulatory Visit: Payer: Self-pay

## 2021-03-10 ENCOUNTER — Encounter: Payer: Self-pay | Admitting: Family Medicine

## 2021-03-10 VITALS — BP 128/82 | HR 85 | Temp 98.2°F | Wt 229.6 lb

## 2021-03-10 DIAGNOSIS — G44201 Tension-type headache, unspecified, intractable: Secondary | ICD-10-CM

## 2021-03-10 DIAGNOSIS — R519 Headache, unspecified: Secondary | ICD-10-CM | POA: Diagnosis not present

## 2021-03-10 MED ORDER — IBUPROFEN 800 MG PO TABS
800.0000 mg | ORAL_TABLET | Freq: Three times a day (TID) | ORAL | 0 refills | Status: DC
Start: 2021-03-10 — End: 2022-02-15

## 2021-03-10 MED ORDER — KETOROLAC TROMETHAMINE 60 MG/2ML IM SOLN
60.0000 mg | Freq: Once | INTRAMUSCULAR | Status: AC
  Administered 2021-03-10: 60 mg via INTRAMUSCULAR

## 2021-03-10 NOTE — Progress Notes (Signed)
   Subjective:    Patient ID: Vanessa Foster, female    DOB: Aug 18, 1986, 35 y.o.   MRN: [id number]  HPI Chief Complaint  Patient presents with   other    Ongoing issues with headaches lasted three days,    Complains of a bilateral frontal headache that feels like pressure. States this headache started 3 days ago on the top of her head.  Reports having approximately 2 headaches per week and has been having frequent headaches for years. Denies aura.  No vision changes, dizziness, URI symptoms, nausea, vomiting or diarrhea.  States she has only been taking Tylenol   Works nights.  She has been working nights for years and reports a normal sleep-wake cycle for her. Reports being well hydrated.  Denies any recent changes in medication, caffeine or alcohol intake.  Denies skipping meals.  States she used to see headache specialist but has not been there in 4 to 5 years.   Denies fever, chills, chest pain, palpitations, shortness of breath, abdominal pain, urinary symptoms, LE edema.    She is taking OCPs    Review of Systems Pertinent positives and negatives in the history of present illness.     Objective:   Physical Exam Constitutional:      General: She is not in acute distress.    Appearance: Normal appearance. She is not ill-appearing.  Cardiovascular:     Rate and Rhythm: Normal rate and regular rhythm.     Pulses: Normal pulses.  Pulmonary:     Effort: Pulmonary effort is normal.     Breath sounds: Normal breath sounds.  Musculoskeletal:        General: Normal range of motion.     Cervical back: Normal range of motion and neck supple. No rigidity.  Lymphadenopathy:     Cervical: No cervical adenopathy.  Skin:    General: Skin is warm and dry.  Neurological:     General: No focal deficit present.     Mental Status: She is alert and oriented to person, place, and time.     Cranial Nerves: No cranial nerve deficit.     Sensory: No sensory deficit.     Motor:  No weakness.     Coordination: Coordination normal.     Gait: Gait normal.     Deep Tendon Reflexes: Reflexes normal.  Psychiatric:        Mood and Affect: Mood normal.        Behavior: Behavior normal.        Thought Content: Thought content normal.   Pulse 85   Temp 98.2 F (36.8 C)   Wt 229 lb 9.6 oz (104.1 kg)   LMP 02/22/2021   BMI 40.03 kg/m        Assessment & Plan:  Frequent headaches - Plan: ketorolac (TORADOL) injection 60 mg, ibuprofen (ADVIL) 800 MG tablet, Ambulatory referral to Neurology  Acute intractable tension-type headache - Plan: ketorolac (TORADOL) injection 60 mg, Ambulatory referral to Neurology  No red flag symptoms.  She has been having chronic headaches for years.  Has not seen a headache specialist for more than 4 years.  She appears to be staying well-hydrated, on a regular sleep schedule for her, avoiding fluctuations in caffeine, alcohol and meals. Toradol given today per patient request.  Ibuprofen refilled and she is aware that she should not start this until tonight due to the Toradol injection.

## 2021-03-11 ENCOUNTER — Encounter: Payer: Self-pay | Admitting: Family Medicine

## 2021-03-19 ENCOUNTER — Encounter: Payer: Self-pay | Admitting: Internal Medicine

## 2021-04-03 ENCOUNTER — Ambulatory Visit (INDEPENDENT_AMBULATORY_CARE_PROVIDER_SITE_OTHER): Payer: No Typology Code available for payment source | Admitting: Family Medicine

## 2021-04-03 ENCOUNTER — Other Ambulatory Visit: Payer: Self-pay

## 2021-04-03 ENCOUNTER — Encounter: Payer: Self-pay | Admitting: Family Medicine

## 2021-04-03 VITALS — BP 128/86 | HR 84 | Ht 63.5 in | Wt 229.4 lb

## 2021-04-03 DIAGNOSIS — M79605 Pain in left leg: Secondary | ICD-10-CM | POA: Diagnosis not present

## 2021-04-03 DIAGNOSIS — M79604 Pain in right leg: Secondary | ICD-10-CM

## 2021-04-03 NOTE — Patient Instructions (Addendum)
  We are going to check some labs to evaluate for things that can contribute to restless legs. Be sure to drink lots of water (goal is for urine to be very pale or clear) We are checking a magnesium level.  Sometimes a supplement can help prevent leg cramps (and migraines)--I want to make sure that your level isn't already on the high side before taking extra.   Please wear compression socks/stockings daily. Put them on when you wake up.  You don't need to wear them when sleeping. During your shift, try and walk and do some calf raises frequently. Limit the sodium in your diet (salt)  Feel free to take Tylenol Arthritis or extra strength tylenol at bedtime. You can also try topical SalonPas with lidocaine to see if that helps.

## 2021-04-03 NOTE — Progress Notes (Signed)
Chief Complaint  Patient presents with   Leg Pain    Leg and foot pain. Has some swelling. Says when she lays down at night it feels like her legs are being shocked. Husband tries to massage her legs and sometimes hurts to the touch. Biofreeze and 800mg  ibuprofen do not work.     "I feel like I have restless leg syndrome"  When she tries to lay down in the evening, legs ache,sometimes feels shockwaves, the whole lower leg hurts to touch. Doesn't think they are swollen, but they feel tight.  Sometimes at night, when asleep, she is moving her legs without knowing it.  When she wakes up, legs are bent a certain way, to find a way to be comfortable. Pain wakes her up. She reports that her husband says she has frequent leg movements at night.  This is now occurring more frequently, not just in the evenings  When watching TV, if legs hurt, she has to move them to relieve pain. Has pain with legs moving. Feels like when she is sitting down for too long, legs are tingling like asleep.  She reports these symptoms starting getting worse in the last 2.5 weeks. No change in shoes, activity. Works nights, on her feet a lot.  Labs in June Hg 11.8, MCV 7 Urine SG 1.020 Rest normal  Regular menses, not particularly heavy. H/o IDA in past  PMH, PSH, SH reviewed  Outpatient Encounter Medications as of 04/03/2021  Medication Sig   ibuprofen (ADVIL) 800 MG tablet Take 1 tablet (800 mg total) by mouth 3 (three) times daily.   norethindrone (MICRONOR) 0.35 MG tablet Take 1 tablet by mouth daily.   polyethylene glycol (MIRALAX / GLYCOLAX) 17 g packet Take 17 g by mouth daily as needed for mild constipation.   albuterol (VENTOLIN HFA) 108 (90 Base) MCG/ACT inhaler Inhale 2 puffs into the lungs every 6 (six) hours as needed for wheezing or shortness of breath. (Patient not taking: Reported on 04/03/2021)   ondansetron (ZOFRAN ODT) 4 MG disintegrating tablet Take 1 tablet (4 mg total) by mouth every 8 (eight)  hours as needed for nausea or vomiting. (Patient not taking: Reported on 04/03/2021)   [DISCONTINUED] pantoprazole (PROTONIX) 40 MG tablet Take 1 tablet (40 mg total) by mouth daily for 14 days. (Patient not taking: Reported on 01/28/2020)   [DISCONTINUED] sucralfate (CARAFATE) 1 g tablet Take 1 tablet (1 g total) by mouth 4 (four) times daily -  with meals and at bedtime for 14 days. (Patient not taking: Reported on 01/28/2020)   No facility-administered encounter medications on file as of 04/03/2021.   No Known Allergies  ROS: no fever, chills, URI symptoms, headaches, dizziness, chest pain.  Leg pain per HPI.  No visible swelling, no bleeding, rashes.  No GI complaints. See HPI.    PHYSICAL EXAM:  BP 128/86   Pulse 84   Ht 5' 3.5" (1.613 m)   Wt 229 lb 6.4 oz (104.1 kg)   LMP 03/27/2021 (Exact Date)   BMI 40.00 kg/m   Well-appearing, obese female in no distress Heart: regular rate and rhythm Lungs: clear Abdomen: soft, nontender Extremities:No pitting edema, 2+pulses No visible varicosities, doesn't appear to have any focal tenderness. Skin: normal turgor, no rashes, lesions  ASSESSMENT/PLAN:  Pain in both lower extremities - Ddx reviewed. Poss may have component of PLMD/RLS, but many other symptoms that aren't consistent.  - Plan: CBC with Differential/Platelet, Ferritin, Iron, Magnesium, Basic metabolic panel   We are  going to check some labs to evaluate for things that can contribute to restless legs. Be sure to drink lots of water (goal is for urine to be very pale or clear) We are checking a magnesium level.  Sometimes a supplement can help prevent leg cramps (and migraines)--I want to make sure that your level isn't already on the high side before taking extra.  Please wear compression socks/stockings daily. Put them on when you wake up.  You don't need to wear them when sleeping. During your shift, try and walk and do some calf raises frequently. Limit the sodium in your  diet (salt)   Feel free to take Tylenol Arthritis or extra strength tylenol at bedtime. You can also try topical SalonPas with lidocaine to see if that helps.

## 2021-04-04 LAB — BASIC METABOLIC PANEL
BUN/Creatinine Ratio: 16 (ref 9–23)
BUN: 12 mg/dL (ref 6–20)
CO2: 21 mmol/L (ref 20–29)
Calcium: 9.6 mg/dL (ref 8.7–10.2)
Chloride: 104 mmol/L (ref 96–106)
Creatinine, Ser: 0.73 mg/dL (ref 0.57–1.00)
Glucose: 101 mg/dL — ABNORMAL HIGH (ref 65–99)
Potassium: 4.4 mmol/L (ref 3.5–5.2)
Sodium: 140 mmol/L (ref 134–144)
eGFR: 112 mL/min/{1.73_m2} (ref >59)

## 2021-04-04 LAB — CBC WITH DIFFERENTIAL/PLATELET
Basophils Absolute: 0 10*3/uL (ref 0.0–0.2)
Basos: 1 %
EOS (ABSOLUTE): 0.3 10*3/uL (ref 0.0–0.4)
Eos: 4 %
Hematocrit: 36.8 % (ref 34.0–46.6)
Hemoglobin: 11.5 g/dL (ref 11.1–15.9)
Immature Grans (Abs): 0 10*3/uL (ref 0.0–0.1)
Immature Granulocytes: 0 %
Lymphocytes Absolute: 3.4 10*3/uL — ABNORMAL HIGH (ref 0.7–3.1)
Lymphs: 42 %
MCH: 24.9 pg — ABNORMAL LOW (ref 26.6–33.0)
MCHC: 31.3 g/dL — ABNORMAL LOW (ref 31.5–35.7)
MCV: 80 fL (ref 79–97)
Monocytes Absolute: 0.6 10*3/uL (ref 0.1–0.9)
Monocytes: 8 %
Neutrophils Absolute: 3.7 10*3/uL (ref 1.4–7.0)
Neutrophils: 45 %
Platelets: 272 10*3/uL (ref 150–450)
RBC: 4.62 x10E6/uL (ref 3.77–5.28)
RDW: 14.9 % (ref 11.7–15.4)
WBC: 8.1 10*3/uL (ref 3.4–10.8)

## 2021-04-04 LAB — IRON: Iron: 38 ug/dL (ref 27–159)

## 2021-04-04 LAB — MAGNESIUM: Magnesium: 2 mg/dL (ref 1.6–2.3)

## 2021-04-04 LAB — FERRITIN: Ferritin: 39 ng/mL (ref 15–150)

## 2021-04-16 ENCOUNTER — Ambulatory Visit (INDEPENDENT_AMBULATORY_CARE_PROVIDER_SITE_OTHER): Payer: No Typology Code available for payment source | Admitting: Medical

## 2021-04-16 ENCOUNTER — Encounter: Payer: Self-pay | Admitting: Medical

## 2021-04-16 ENCOUNTER — Other Ambulatory Visit: Payer: Self-pay

## 2021-04-16 VITALS — BP 130/82 | HR 66 | Temp 98.0°F | Wt 229.8 lb

## 2021-04-16 DIAGNOSIS — L732 Hidradenitis suppurativa: Secondary | ICD-10-CM | POA: Diagnosis not present

## 2021-04-16 MED ORDER — HYDROCODONE-ACETAMINOPHEN 5-325 MG PO TABS: 1.0000 | ORAL_TABLET | Freq: Four times a day (QID) | ORAL | 0 refills | Status: DC | PRN

## 2021-04-16 MED ORDER — DOXYCYCLINE HYCLATE 100 MG PO TABS: 100.0000 mg | ORAL_TABLET | Freq: Two times a day (BID) | ORAL | 0 refills | Status: DC

## 2021-04-16 NOTE — Progress Notes (Signed)
Subjective:  Vanessa Foster is a 35 y.o. female who presents for Chief Complaint  Patient presents with   Recurrent Skin Infections    Right under arm     Here for boil in right armpit.  She notes having this problem since 2010 ongoing.  She gets these infected pus draining boils in her armpits fairly regularly.  Current one started about a week ago swollen and painful in the right armpit.  It spontaneously started draining in our lobby.  She denies fever, body aches or chills.  She has been on various antibiotics in the past many, has tried different creams.  She tries to reduce having sweaty armpits.  No prior consult with dermatology or plastic surgery.  No other aggravating or relieving factors.    No other c/o.  The following portions of the patient's history were reviewed and updated as appropriate: allergies, current medications, past family history, past medical history, past social history, past surgical history and problem list.  ROS Otherwise as in subjective above  Objective: BP 130/82   Pulse 66   Temp 98 F (36.7 C)   Wt 229 lb 12.8 oz (104.2 kg)   LMP 03/27/2021 (Exact Date)   SpO2 99%   BMI 40.07 kg/m   General appearance: alert, no distress, well developed, well nourished Left axilla with scars from prior hidradenitis.  Right armpit today with approximately 5 cm in diameter area of induration, quite tender to touch, there is a raised bullous fluctuant area that is approximately 1.5 cm x 3 cm    Assessment: Encounter Diagnosis  Name Primary?   Hidradenitis suppurativa Yes     Plan: We discussed the long-term chronic nature of this problem that she has had.  She is prediabetic per labs from June.  Negative for HIV.  We discussed other factors such as obesity and sweating.  Begin medication as below.  We discussed incision and drainage today.  She agreed to procedure.  Cleaned and prepped area in usual sterile fashion.  Use ethyl acetate spray for local  anesthesia.  Use scalpel to incise the area with spontaneous copious thick purulent discharge approximately 5 cc worth.  We will make a referral to plastic surgery for consult on other options for therapy.  Consider dermatology consult if plastic surgery does not feel they can help  Vanessa Foster was seen today for recurrent skin infections.  Diagnoses and all orders for this visit:  Hidradenitis suppurativa -     Ambulatory referral to Plastic Surgery  Other orders -     doxycycline (VIBRA-TABS) 100 MG tablet; Take 1 tablet (100 mg total) by mouth 2 (two) times daily. -     HYDROcodone-acetaminophen (NORCO) 5-325 MG tablet; Take 1 tablet by mouth every 6 (six) hours as needed.    Follow up: pending referral

## 2021-04-23 ENCOUNTER — Ambulatory Visit: Payer: No Typology Code available for payment source | Admitting: Family Medicine

## 2021-05-14 ENCOUNTER — Ambulatory Visit: Payer: No Typology Code available for payment source | Admitting: Neurology

## 2021-05-14 ENCOUNTER — Encounter: Payer: Self-pay | Admitting: Neurology

## 2021-08-16 ENCOUNTER — Encounter (HOSPITAL_COMMUNITY): Payer: Self-pay | Admitting: Emergency Medicine

## 2021-08-16 ENCOUNTER — Emergency Department (HOSPITAL_COMMUNITY)
Admission: EM | Admit: 2021-08-16 | Discharge: 2021-08-16 | Disposition: A | Discharge status: Home / Self Care | Payer: Self-pay | Attending: Emergency Medicine | Admitting: Emergency Medicine

## 2021-08-16 DIAGNOSIS — J4520 Mild intermittent asthma, uncomplicated: Secondary | ICD-10-CM | POA: Insufficient documentation

## 2021-08-16 DIAGNOSIS — Z87891 Personal history of nicotine dependence: Secondary | ICD-10-CM | POA: Insufficient documentation

## 2021-08-16 DIAGNOSIS — K029 Dental caries, unspecified: Secondary | ICD-10-CM | POA: Insufficient documentation

## 2021-08-16 DIAGNOSIS — K0889 Other specified disorders of teeth and supporting structures: Secondary | ICD-10-CM

## 2021-08-16 DIAGNOSIS — N390 Urinary tract infection, site not specified: Secondary | ICD-10-CM | POA: Insufficient documentation

## 2021-08-16 LAB — URINALYSIS, ROUTINE W REFLEX MICROSCOPIC
Bilirubin Urine: NEGATIVE
Glucose, UA: NEGATIVE mg/dL
Ketones, ur: NEGATIVE mg/dL
Nitrite: NEGATIVE
Protein, ur: 100 mg/dL — AB
Specific Gravity, Urine: 1.02 (ref 1.005–1.030)
Trans Epithel, UA: 2
WBC, UA: >50 WBC/hpf — ABNORMAL HIGH (ref 0–5)
pH: 5 (ref 5.0–8.0)

## 2021-08-16 LAB — PREGNANCY, URINE: Preg Test, Ur: NEGATIVE

## 2021-08-16 MED ORDER — LIDOCAINE VISCOUS HCL 2 % MT SOLN: 10.0000 mL | OROMUCOSAL | 0 refills | Status: DC | PRN

## 2021-08-16 MED ORDER — AMOXICILLIN-POT CLAVULANATE 875-125 MG PO TABS
1.0000 | ORAL_TABLET | Freq: Once | ORAL | Status: AC
  Administered 2021-08-16: 1 via ORAL
  Filled 2021-08-16: qty 1

## 2021-08-16 MED ORDER — HYDROCODONE-ACETAMINOPHEN 5-325 MG PO TABS: 1.0000 | ORAL_TABLET | ORAL | 0 refills | Status: DC | PRN

## 2021-08-16 MED ORDER — HYDROCODONE-ACETAMINOPHEN 5-325 MG PO TABS
1.0000 | ORAL_TABLET | Freq: Once | ORAL | Status: AC
Start: 2021-08-16 — End: 2021-08-16
  Administered 2021-08-16: 1 via ORAL
  Filled 2021-08-16: qty 1

## 2021-08-16 MED ORDER — PENICILLIN V POTASSIUM 500 MG PO TABS: 500.0000 mg | ORAL_TABLET | Freq: Once | ORAL | Status: DC

## 2021-08-16 MED ORDER — AMOXICILLIN-POT CLAVULANATE 875-125 MG PO TABS: 1.0000 | ORAL_TABLET | Freq: Two times a day (BID) | ORAL | 0 refills | Status: DC

## 2021-08-16 MED ORDER — IBUPROFEN 600 MG PO TABS: 600.0000 mg | ORAL_TABLET | Freq: Four times a day (QID) | ORAL | 0 refills | Status: DC | PRN

## 2021-08-16 MED ORDER — LIDOCAINE VISCOUS HCL 2 % MT SOLN
15.0000 mL | Freq: Once | OROMUCOSAL | Status: AC
  Administered 2021-08-16: 15 mL via OROMUCOSAL
  Filled 2021-08-16: qty 15

## 2021-08-16 NOTE — ED Triage Notes (Signed)
Patient reports dental pain x4 days. Rated 6/10. Right side, top and bottom.

## 2021-08-16 NOTE — ED Provider Notes (Signed)
Vanessa Foster Provider Note   CSN: [id number] Arrival date & time: 08/16/21  0848     History Chief Complaint  Patient presents with   Dental Pain    Vanessa Foster is a 35 y.o. female.  This is a 35 y.o. female with significant medical history as below, including asthma, mixed hyperlipidemia who presents to the ED with complaint of dental pain.  Patient ports onset dental pain approximately 4 days ago.  Localized to the right upper portion of her mandible.  She has been able to tolerate oral intake without difficulty.  No fevers, chills, nausea or vomiting.  No difficulty speaking, no difficulty breathing.  She reports she last saw dentist approximately 5 to 6 months ago.  Has not tried to see dentist over the past week.  Pain described as sharp, stabbing.  Nonradiating.  No medications prior to arrival.  Pain worsened with direct palpation or chewing. She also has some mild associated pain to right lower teeth.  The history is provided by the patient. No language interpreter was used.  Dental Pain Location:  Upper Associated symptoms: no facial swelling, no fever and no headaches       Past Medical History:  Diagnosis Date   Abnormal Pap smear of cervix    2011?   Allergy    Anemia    Anxiety    Asthma    hospitalization 2008, last attack this month   Chronic nausea    Depression    Dysmenorrhea    Dyspareunia    Gestational diabetes 07/13/2016   no meds   Migraines    Mixed hyperlipidemia 02/23/2021   PID (pelvic inflammatory disease)    Wears glasses     Patient Active Problem List   Diagnosis Date Noted   Mixed hyperlipidemia 02/23/2021   Morbid obesity ([place]) 02/21/2021   Prediabetes 02/21/2021   Recurrent boils 02/21/2021   Hidradenitis suppurativa of left axilla 02/21/2021   Pruritic intertrigo 02/21/2021   Abscess of right groin 02/21/2021   Yeast infection 01/06/2018   S/P primary low transverse C-section  10/26/2016   Normal postpartum course 10/26/2016   Gestational diabetes mellitus 09/19/2016   Anemia of mother in pregnancy, antepartum 07/29/2016   Daytime somnolence 07/11/2015   Sleep disorder 07/11/2015   Cephalalgia 07/11/2015   Mild intermittent asthma without complication [id number]   Well woman exam with routine gynecological exam 07/11/2015   Depressed mood 07/11/2015   Chronic nausea 07/11/2015   Changing skin lesion 07/11/2015   Adjustment disorder with mixed anxiety and depressed mood 07/11/2015   Hemorrhoids 04/30/2014    Past Surgical History:  Procedure Laterality Date   CESAREAN SECTION     CESAREAN SECTION N/A 09/19/2016   Procedure: CESAREAN SECTION;  Surgeon: Aletha Halim, MD;  Location: [facility];  Service: Obstetrics;  Laterality: N/A;   INCISE AND DRAIN ABCESS  2019   under arms " boils"   WISDOM TOOTH EXTRACTION       OB History     Gravida  2   Para  2   Term  2   Preterm      AB      Living  1      SAB      IAB      Ectopic      Multiple      Live Births  1           Family History  Problem Relation Age of  Onset   Hypertension Mother    Heart disease Mother        early 24s   Diabetes Mother    Diabetes Paternal Grandmother    Stroke Paternal Grandmother     Social History   Tobacco Use   Smoking status: Former    Types: Cigarettes    Quit date: 03/20/2008    Years since quitting: 13.4   Smokeless tobacco: Never  Vaping Use   Vaping Use: Never used  Substance Use Topics   Alcohol use: No    Alcohol/week: 0.0 standard drinks   Drug use: No    Home Medications Prior to Admission medications   Medication Sig Start Date End Date Taking? Authorizing Provider  amoxicillin-clavulanate (AUGMENTIN) 875-125 MG tablet Take 1 tablet by mouth every 12 (twelve) hours. 08/16/21  Yes Tanda Rockers A, DO  HYDROcodone-acetaminophen (NORCO/VICODIN) 5-325 MG tablet Take 1 tablet by mouth every 4 (four) hours as  needed. 08/16/21  Yes Tanda Rockers A, DO  ibuprofen (ADVIL) 600 MG tablet Take 1 tablet (600 mg total) by mouth every 6 (six) hours as needed. 08/16/21  Yes Tanda Rockers A, DO  lidocaine (XYLOCAINE) 2 % solution Use as directed 10 mLs in the mouth or throat as needed for mouth pain. 08/16/21  Yes Tanda Rockers A, DO  albuterol (VENTOLIN HFA) 108 (90 Base) MCG/ACT inhaler Inhale 2 puffs into the lungs every 6 (six) hours as needed for wheezing or shortness of breath. Patient not taking: No sig reported 01/09/21   Hetty Blend L, PA-C  doxycycline (VIBRA-TABS) 100 MG tablet Take 1 tablet (100 mg total) by mouth 2 (two) times daily. 04/16/21   Tysinger, Kermit Balo, PA-C  HYDROcodone-acetaminophen (NORCO) 5-325 MG tablet Take 1 tablet by mouth every 6 (six) hours as needed. 04/16/21   Tysinger, Kermit Balo, PA-C  ibuprofen (ADVIL) 800 MG tablet Take 1 tablet (800 mg total) by mouth 3 (three) times daily. 03/10/21   Henson, Vickie L, PA-C  norethindrone (MICRONOR) 0.35 MG tablet Take 1 tablet by mouth daily. 12/12/20   [provider]  ondansetron (ZOFRAN ODT) 4 MG disintegrating tablet Take 1 tablet (4 mg total) by mouth every 8 (eight) hours as needed for nausea or vomiting. Patient not taking: No sig reported 11/08/20   Wieters, Hallie C, PA-C  polyethylene glycol (MIRALAX / GLYCOLAX) 17 g packet Take 17 g by mouth daily as needed for mild constipation.    [provider]  pantoprazole (PROTONIX) 40 MG tablet Take 1 tablet (40 mg total) by mouth daily for 14 days. Patient not taking: Reported on 01/28/2020 12/31/18 07/10/20  Cristina Gong, PA-C  sucralfate (CARAFATE) 1 g tablet Take 1 tablet (1 g total) by mouth 4 (four) times daily -  with meals and at bedtime for 14 days. Patient not taking: Reported on 01/28/2020 12/31/18 07/10/20  Cristina Gong, PA-C    Allergies    Patient has no known allergies.  Review of Systems   Review of Systems  Constitutional:  Negative for chills and  fever.  HENT:  Positive for dental problem. Negative for facial swelling and trouble swallowing.   Eyes:  Negative for photophobia and visual disturbance.  Respiratory:  Negative for cough and shortness of breath.   Cardiovascular:  Negative for chest pain and palpitations.  Gastrointestinal:  Negative for abdominal pain, nausea and vomiting.  Endocrine: Negative for polydipsia and polyuria.  Genitourinary:  Negative for difficulty urinating and hematuria.  Musculoskeletal:  Negative for  gait problem and joint swelling.  Skin:  Negative for pallor and rash.  Neurological:  Negative for syncope and headaches.  Psychiatric/Behavioral:  Negative for agitation and confusion.    Physical Exam Updated Vital Signs BP (!) 170/119 (BP Location: Left Arm)   Pulse 80   Temp 98.8 F (37.1 C) (Oral)   Resp 18   Ht 5\' 1"  (1.549 m)   Wt 104.3 kg   LMP 08/16/2021   SpO2 100%   BMI 43.46 kg/m   Physical Exam Vitals and nursing note reviewed.  Constitutional:      General: She is not in acute distress.    Appearance: Normal appearance. She is obese.  HENT:     Head: Normocephalic and atraumatic.     Right Ear: External ear normal.     Left Ear: External ear normal.     Nose: Nose normal.     Mouth/Throat:     Mouth: Mucous membranes are moist.     Dentition: Abnormal dentition. Dental tenderness and dental caries present.      Comments: Poor dentition, multiple broken teeth.  No observable discrete abscess, uvula is midline, no tonsillar swelling or exudate.  No subglottic swelling.  No trismus or drooling, no stridor Eyes:     General: No scleral icterus.       Right eye: No discharge.        Left eye: No discharge.  Cardiovascular:     Rate and Rhythm: Normal rate and regular rhythm.     Pulses: Normal pulses.     Heart sounds: Normal heart sounds.  Pulmonary:     Effort: Pulmonary effort is normal. No respiratory distress.     Breath sounds: Normal breath sounds.  Abdominal:      General: Abdomen is flat.     Tenderness: There is no abdominal tenderness.  Musculoskeletal:        General: Normal range of motion.     Cervical back: Normal range of motion.     Right lower leg: No edema.     Left lower leg: No edema.  Skin:    General: Skin is warm and dry.     Capillary Refill: Capillary refill takes less than 2 seconds.  Neurological:     Mental Status: She is alert.  Psychiatric:        Mood and Affect: Mood normal.        Behavior: Behavior normal.    ED Results / Procedures / Treatments   Labs (all labs ordered are listed, but only abnormal results are displayed) Labs Reviewed  URINALYSIS, ROUTINE W REFLEX MICROSCOPIC - Abnormal; Notable for the following components:      Result Value   APPearance HAZY (*)    Hgb urine dipstick LARGE (*)    Protein, ur 100 (*)    Leukocytes,Ua SMALL (*)    WBC, UA >50 (*)    Bacteria, UA RARE (*)    All other components within normal limits  URINE CULTURE  PREGNANCY, URINE    EKG None  Radiology No results found.  Procedures Procedures   Medications Ordered in ED Medications  HYDROcodone-acetaminophen (NORCO/VICODIN) 5-325 MG per tablet 1 tablet (has no administration in time range)  lidocaine (XYLOCAINE) 2 % viscous mouth solution 15 mL (has no administration in time range)  amoxicillin-clavulanate (AUGMENTIN) 875-125 MG per tablet 1 tablet (has no administration in time range)    ED Course  I have reviewed the triage vital signs and the  nursing notes.  Pertinent labs & imaging results that were available during my care of the patient were reviewed by me and considered in my medical decision making (see chart for details).    MDM Rules/Calculators/A&P                           CC: Dental pain  This patient complains of above; this involves an extensive number of treatment options and is a complaint that carries with it a high risk of complications and morbidity. Vital signs were reviewed.  Serious etiologies considered.  Record review:   Previous records obtained and reviewed   Work up as above, notable for:  Lab  results that were available during my care of the patient were reviewed by me and considered in my medical decision making.    Management: Obtain urine pregnancy test, patient with significant pain to her teeth, there is no discrete abscess but will treat empirically with Veetid.  Oral analgesics.  Rec patient follow-up with dentist at her earliest convenience  UA consistent with UTI. Urine culture sent, no n/v, low suspicion for pyelonephritis. Will give augmentin to treat both dental and GU abnormality.   The patient improved significantly and was discharged in stable condition. Detailed discussions were had with the patient regarding current findings, and need for close f/u with PCP or on call doctor. The patient has been instructed to return immediately if the symptoms worsen in any way for re-evaluation. Patient verbalized understanding and is in agreement with current care plan. All questions answered prior to discharge.          This chart was dictated using voice recognition software.  Despite best efforts to proofread,  errors can occur which can change the documentation meaning.  Final Clinical Impression(s) / ED Diagnoses Final diagnoses:  Tooth pain  Urinary tract infection with hematuria, site unspecified    Rx / DC Orders ED Discharge Orders          Ordered    HYDROcodone-acetaminophen (NORCO/VICODIN) 5-325 MG tablet  Every 4 hours PRN        08/16/21 1013    amoxicillin-clavulanate (AUGMENTIN) 875-125 MG tablet  Every 12 hours        08/16/21 1013    lidocaine (XYLOCAINE) 2 % solution  As needed        08/16/21 1013    ibuprofen (ADVIL) 600 MG tablet  Every 6 hours PRN        08/16/21 1013             Wynona Dove A, DO 08/16/21 1019

## 2021-08-16 NOTE — Discharge Instructions (Signed)
  Please follow up with dentist as soon as possible.  

## 2021-08-17 LAB — URINE CULTURE: Culture: <10000 — AB

## 2021-08-30 ENCOUNTER — Emergency Department (HOSPITAL_COMMUNITY)
Admission: EM | Admit: 2021-08-30 | Discharge: 2021-08-30 | Disposition: A | Discharge status: Home / Self Care | Payer: Self-pay | Attending: Emergency Medicine | Admitting: Emergency Medicine

## 2021-08-30 ENCOUNTER — Encounter (HOSPITAL_COMMUNITY): Payer: Self-pay

## 2021-08-30 ENCOUNTER — Other Ambulatory Visit: Payer: Self-pay

## 2021-08-30 DIAGNOSIS — Z87891 Personal history of nicotine dependence: Secondary | ICD-10-CM | POA: Insufficient documentation

## 2021-08-30 DIAGNOSIS — J069 Acute upper respiratory infection, unspecified: Secondary | ICD-10-CM | POA: Insufficient documentation

## 2021-08-30 DIAGNOSIS — J45909 Unspecified asthma, uncomplicated: Secondary | ICD-10-CM | POA: Insufficient documentation

## 2021-08-30 DIAGNOSIS — N739 Female pelvic inflammatory disease, unspecified: Secondary | ICD-10-CM | POA: Insufficient documentation

## 2021-08-30 DIAGNOSIS — Z20822 Contact with and (suspected) exposure to covid-19: Secondary | ICD-10-CM | POA: Insufficient documentation

## 2021-08-30 LAB — URINALYSIS, ROUTINE W REFLEX MICROSCOPIC
Bacteria, UA: NONE SEEN
Bilirubin Urine: NEGATIVE
Glucose, UA: NEGATIVE mg/dL
Hgb urine dipstick: NEGATIVE
Ketones, ur: NEGATIVE mg/dL
Nitrite: NEGATIVE
Protein, ur: 30 mg/dL — AB
Specific Gravity, Urine: 1.019 (ref 1.005–1.030)
pH: 7 (ref 5.0–8.0)

## 2021-08-30 LAB — RESP PANEL BY RT-PCR (FLU A&B, COVID) ARPGX2
Influenza A by PCR: NEGATIVE
Influenza B by PCR: NEGATIVE
SARS Coronavirus 2 by RT PCR: NEGATIVE

## 2021-08-30 MED ORDER — ACETAMINOPHEN 325 MG PO TABS
650.0000 mg | ORAL_TABLET | Freq: Once | ORAL | Status: AC | PRN
  Administered 2021-08-30: 650 mg via ORAL
  Filled 2021-08-30: qty 2

## 2021-08-30 MED ORDER — ACETAMINOPHEN 325 MG PO TABS: 650.0000 mg | ORAL_TABLET | Freq: Four times a day (QID) | ORAL | Status: DC | PRN

## 2021-08-30 NOTE — ED Triage Notes (Signed)
Pt c/o chills, sore throat, cough, and body aches since last night. Reports possible exposure to covid.

## 2021-08-30 NOTE — ED Provider Notes (Signed)
[facility]-EMERGENCY DEPT Provider Note   CSN: [id number] Arrival date & time: 08/30/21  1806     History Chief Complaint  Patient presents with   Chills   Generalized Body Aches   Cough   Sore Throat    Vanessa Foster is a 35 y.o. female.  The history is provided by the patient. No language interpreter was used.  Cough Cough characteristics:  Non-productive Sputum characteristics:  Nondescript Severity:  Moderate Onset quality:  Gradual Timing:  Constant Progression:  Worsening Chronicity:  New Context: upper respiratory infection   Relieved by:  Nothing Worsened by:  Nothing Ineffective treatments:  None tried Sore Throat      Past Medical History:  Diagnosis Date   Abnormal Pap smear of cervix    2011?   Allergy    Anemia    Anxiety    Asthma    hospitalization 2008, last attack this month   Chronic nausea    Depression    Dysmenorrhea    Dyspareunia    Gestational diabetes 07/13/2016   no meds   Migraines    Mixed hyperlipidemia 02/23/2021   PID (pelvic inflammatory disease)    Wears glasses     Patient Active Problem List   Diagnosis Date Noted   Mixed hyperlipidemia 02/23/2021   Morbid obesity ([facility]) 02/21/2021   Prediabetes 02/21/2021   Recurrent boils 02/21/2021   Hidradenitis suppurativa of left axilla 02/21/2021   Pruritic intertrigo 02/21/2021   Abscess of right groin 02/21/2021   Yeast infection 01/06/2018   S/P primary low transverse C-section 10/26/2016   Normal postpartum course 10/26/2016   Gestational diabetes mellitus 09/19/2016   Anemia of mother in pregnancy, antepartum 07/29/2016   Daytime somnolence 07/11/2015   Sleep disorder 07/11/2015   Cephalalgia 07/11/2015   Mild intermittent asthma without complication 07/11/2015   Well woman exam with routine gynecological exam 07/11/2015   Depressed mood 07/11/2015   Chronic nausea 07/11/2015   Changing skin lesion 07/11/2015   Adjustment disorder  with mixed anxiety and depressed mood 07/11/2015   Hemorrhoids 04/30/2014    Past Surgical History:  Procedure Laterality Date   CESAREAN SECTION     CESAREAN SECTION N/A 09/19/2016   Procedure: CESAREAN SECTION;  Surgeon: Tustin Bing, MD;  Location: [facility];  Service: Obstetrics;  Laterality: N/A;   INCISE AND DRAIN ABCESS  2019   under arms " boils"   WISDOM TOOTH EXTRACTION       OB History     Gravida  2   Para  2   Term  2   Preterm      AB      Living  1      SAB      IAB      Ectopic      Multiple      Live Births  1           Family History  Problem Relation Age of Onset   Hypertension Mother    Heart disease Mother        early 32s   Diabetes Mother    Diabetes Paternal Grandmother    Stroke Paternal Grandmother     Social History   Tobacco Use   Smoking status: Former    Types: Cigarettes    Quit date: 03/20/2008    Years since quitting: 13.4   Smokeless tobacco: Never  Vaping Use   Vaping Use: Never used  Substance Use Topics  Alcohol use: No    Alcohol/week: 0.0 standard drinks   Drug use: Yes    Types: Marijuana    Home Medications Prior to Admission medications   Medication Sig Start Date End Date Taking? Authorizing Provider  albuterol (VENTOLIN HFA) 108 (90 Base) MCG/ACT inhaler Inhale 2 puffs into the lungs every 6 (six) hours as needed for wheezing or shortness of breath. Patient not taking: No sig reported 01/09/21   Hetty Blend L, PA-C  amoxicillin-clavulanate (AUGMENTIN) 875-125 MG tablet Take 1 tablet by mouth every 12 (twelve) hours. 08/16/21   Sloan Leiter, DO  doxycycline (VIBRA-TABS) 100 MG tablet Take 1 tablet (100 mg total) by mouth 2 (two) times daily. 04/16/21   Tysinger, Kermit Balo, PA-C  HYDROcodone-acetaminophen (NORCO) 5-325 MG tablet Take 1 tablet by mouth every 6 (six) hours as needed. 04/16/21   Tysinger, Kermit Balo, PA-C  HYDROcodone-acetaminophen (NORCO/VICODIN) 5-325 MG tablet Take 1  tablet by mouth every 4 (four) hours as needed. 08/16/21   Sloan Leiter, DO  ibuprofen (ADVIL) 600 MG tablet Take 1 tablet (600 mg total) by mouth every 6 (six) hours as needed. 08/16/21   Sloan Leiter, DO  ibuprofen (ADVIL) 800 MG tablet Take 1 tablet (800 mg total) by mouth 3 (three) times daily. 03/10/21   Henson, Vickie L, PA-C  lidocaine (XYLOCAINE) 2 % solution Use as directed 10 mLs in the mouth or throat as needed for mouth pain. 08/16/21   Sloan Leiter, DO  norethindrone (MICRONOR) 0.35 MG tablet Take 1 tablet by mouth daily. 12/12/20   [provider]  ondansetron (ZOFRAN ODT) 4 MG disintegrating tablet Take 1 tablet (4 mg total) by mouth every 8 (eight) hours as needed for nausea or vomiting. Patient not taking: No sig reported 11/08/20   Wieters, Hallie C, PA-C  polyethylene glycol (MIRALAX / GLYCOLAX) 17 g packet Take 17 g by mouth daily as needed for mild constipation.    [provider]  pantoprazole (PROTONIX) 40 MG tablet Take 1 tablet (40 mg total) by mouth daily for 14 days. Patient not taking: Reported on 01/28/2020 12/31/18 07/10/20  Cristina Gong, PA-C  sucralfate (CARAFATE) 1 g tablet Take 1 tablet (1 g total) by mouth 4 (four) times daily -  with meals and at bedtime for 14 days. Patient not taking: Reported on 01/28/2020 12/31/18 07/10/20  Cristina Gong, PA-C    Allergies    Patient has no known allergies.  Review of Systems   Review of Systems  Respiratory:  Positive for cough.   All other systems reviewed and are negative.  Physical Exam Updated Vital Signs BP (!) 162/118 (BP Location: Right Arm)   Pulse 100   Temp (!) 100.5 F (38.1 C) (Oral)   Resp 16   LMP 08/16/2021   SpO2 95%   Physical Exam Vitals reviewed.  Constitutional:      Appearance: She is well-developed.  Cardiovascular:     Rate and Rhythm: Normal rate.  Abdominal:     Palpations: Abdomen is soft.  Musculoskeletal:     Cervical back: Normal range of  motion.  Skin:    General: Skin is warm.  Neurological:     General: No focal deficit present.     Mental Status: She is alert.  Psychiatric:        Mood and Affect: Mood normal.    ED Results / Procedures / Treatments   Labs (all labs ordered are listed, but only abnormal results  are displayed) Labs Reviewed  RESP PANEL BY RT-PCR (FLU A&B, COVID) ARPGX2    EKG None  Radiology No results found.  Procedures Procedures   Medications Ordered in ED Medications  acetaminophen (TYLENOL) tablet 650 mg (has no administration in time range)  acetaminophen (TYLENOL) tablet 650 mg (650 mg Oral Given 08/30/21 1830)    ED Course  I have reviewed the triage vital signs and the nursing notes.  Pertinent labs & imaging results that were available during my care of the patient were reviewed by me and considered in my medical decision making (see chart for details).    MDM Rules/Calculators/A&P                            Final Clinical Impression(s) / ED Diagnoses Final diagnoses:  Viral URI with cough    Rx / DC Orders ED Discharge Orders     None     An After Visit Summary was printed and given to the patient.    Osie Cheeks 08/30/21 2202    Wynetta Fines, MD 08/30/21 [phone]

## 2021-08-30 NOTE — Discharge Instructions (Signed)
Tylenol every 4 hours  

## 2021-08-31 ENCOUNTER — Encounter (HOSPITAL_COMMUNITY): Payer: Self-pay

## 2021-08-31 ENCOUNTER — Emergency Department (HOSPITAL_COMMUNITY)
Admission: EM | Admit: 2021-08-31 | Discharge: 2021-08-31 | Disposition: A | Discharge status: Home / Self Care | Payer: Self-pay | Attending: Emergency Medicine | Admitting: Emergency Medicine

## 2021-08-31 ENCOUNTER — Other Ambulatory Visit: Payer: Self-pay

## 2021-08-31 DIAGNOSIS — J4520 Mild intermittent asthma, uncomplicated: Secondary | ICD-10-CM | POA: Insufficient documentation

## 2021-08-31 DIAGNOSIS — Z20822 Contact with and (suspected) exposure to covid-19: Secondary | ICD-10-CM | POA: Insufficient documentation

## 2021-08-31 DIAGNOSIS — M791 Myalgia, unspecified site: Secondary | ICD-10-CM | POA: Insufficient documentation

## 2021-08-31 DIAGNOSIS — R6889 Other general symptoms and signs: Secondary | ICD-10-CM

## 2021-08-31 DIAGNOSIS — R509 Fever, unspecified: Secondary | ICD-10-CM | POA: Insufficient documentation

## 2021-08-31 DIAGNOSIS — Z87891 Personal history of nicotine dependence: Secondary | ICD-10-CM | POA: Insufficient documentation

## 2021-08-31 DIAGNOSIS — R0981 Nasal congestion: Secondary | ICD-10-CM | POA: Insufficient documentation

## 2021-08-31 MED ORDER — KETOROLAC TROMETHAMINE 15 MG/ML IJ SOLN
15.0000 mg | Freq: Once | INTRAMUSCULAR | Status: AC
  Administered 2021-08-31: 15 mg via INTRAMUSCULAR
  Filled 2021-08-31: qty 1

## 2021-08-31 NOTE — Discharge Instructions (Addendum)
Continue taking 650mg  tylenol every 6 hours for for fever, headaches, body aches. You can alternate this with 600mg  ibuprofen every 6 hours for pain as needed. Drink plenty of fluids to prevent dehydration. You may continue to use other over-the-counter remedies for symptom control, if desired. Return for new or concerning symptoms such as worsening shortness of breath, coughing up blood, persistent vomiting, loss of consciousness.

## 2021-08-31 NOTE — ED Triage Notes (Signed)
Pt reports with fever, congestion, and generalized body aches since Friday.

## 2021-08-31 NOTE — ED Provider Notes (Signed)
[facility]-EMERGENCY DEPT Provider Note   CSN: [id number] Arrival date & time: 08/31/21  2130     History Chief Complaint  Patient presents with   Fever   Nasal Congestion   Generalized Body Aches    Vanessa Foster is a 35 y.o. female.  35 year old female presents to the emergency department for persistent flulike illness.  Reports onset of symptoms on Friday including tactile fever, nasal congestion, body aches.  States that her back is aching presently.  She has been using Tylenol for symptoms with only temporary improvement.  She was evaluated for these complaints in the emergency department yesterday with a negative flu and COVID swab.  Also reassuring urinalysis noted.  Denies any acute worsening of her symptoms since her prior evaluation.  States that she came back because she is still "feeling bad".  The history is provided by the patient. No language interpreter was used.  Fever     Past Medical History:  Diagnosis Date   Abnormal Pap smear of cervix    2011?   Allergy    Anemia    Anxiety    Asthma    hospitalization 2008, last attack this month   Chronic nausea    Depression    Dysmenorrhea    Dyspareunia    Gestational diabetes 07/13/2016   no meds   Migraines    Mixed hyperlipidemia 02/23/2021   PID (pelvic inflammatory disease)    Wears glasses     Patient Active Problem List   Diagnosis Date Noted   Mixed hyperlipidemia 02/23/2021   Morbid obesity (HCC) 02/21/2021   Prediabetes 02/21/2021   Recurrent boils 02/21/2021   Hidradenitis suppurativa of left axilla 02/21/2021   Pruritic intertrigo 02/21/2021   Abscess of right groin 02/21/2021   Yeast infection 01/06/2018   S/P primary low transverse C-section 10/26/2016   Normal postpartum course 10/26/2016   Gestational diabetes mellitus 09/19/2016   Anemia of mother in pregnancy, antepartum 07/29/2016   Daytime somnolence 07/11/2015   Sleep disorder 07/11/2015    Cephalalgia 07/11/2015   Mild intermittent asthma without complication 07/11/2015   Well woman exam with routine gynecological exam 07/11/2015   Depressed mood 07/11/2015   Chronic nausea 07/11/2015   Changing skin lesion 07/11/2015   Adjustment disorder with mixed anxiety and depressed mood 07/11/2015   Hemorrhoids 04/30/2014    Past Surgical History:  Procedure Laterality Date   CESAREAN SECTION     CESAREAN SECTION N/A 09/19/2016   Procedure: CESAREAN SECTION;  Surgeon: [place] Bing, MD;  Location: [facility];  Service: Obstetrics;  Laterality: N/A;   INCISE AND DRAIN ABCESS  2019   under arms " boils"   WISDOM TOOTH EXTRACTION       OB History     Gravida  2   Para  2   Term  2   Preterm      AB      Living  1      SAB      IAB      Ectopic      Multiple      Live Births  1           Family History  Problem Relation Age of Onset   Hypertension Mother    Heart disease Mother        early 36s   Diabetes Mother    Diabetes Paternal Grandmother    Stroke Paternal Grandmother     Social History  Tobacco Use   Smoking status: Former    Types: Cigarettes    Quit date: 03/20/2008    Years since quitting: 13.4   Smokeless tobacco: Never  Vaping Use   Vaping Use: Never used  Substance Use Topics   Alcohol use: No    Alcohol/week: 0.0 standard drinks   Drug use: Yes    Types: Marijuana    Home Medications Prior to Admission medications   Medication Sig Start Date End Date Taking? Authorizing Provider  albuterol (VENTOLIN HFA) 108 (90 Base) MCG/ACT inhaler Inhale 2 puffs into the lungs every 6 (six) hours as needed for wheezing or shortness of breath. Patient not taking: No sig reported 01/09/21   Hetty Blend L, PA-C  amoxicillin-clavulanate (AUGMENTIN) 875-125 MG tablet Take 1 tablet by mouth every 12 (twelve) hours. 08/16/21   Sloan Leiter, DO  doxycycline (VIBRA-TABS) 100 MG tablet Take 1 tablet (100 mg total) by mouth 2  (two) times daily. 04/16/21   Tysinger, Kermit Balo, PA-C  HYDROcodone-acetaminophen (NORCO) 5-325 MG tablet Take 1 tablet by mouth every 6 (six) hours as needed. 04/16/21   Tysinger, Kermit Balo, PA-C  HYDROcodone-acetaminophen (NORCO/VICODIN) 5-325 MG tablet Take 1 tablet by mouth every 4 (four) hours as needed. 08/16/21   Sloan Leiter, DO  ibuprofen (ADVIL) 600 MG tablet Take 1 tablet (600 mg total) by mouth every 6 (six) hours as needed. 08/16/21   Sloan Leiter, DO  ibuprofen (ADVIL) 800 MG tablet Take 1 tablet (800 mg total) by mouth 3 (three) times daily. 03/10/21   Henson, Vickie L, PA-C  lidocaine (XYLOCAINE) 2 % solution Use as directed 10 mLs in the mouth or throat as needed for mouth pain. 08/16/21   Sloan Leiter, DO  norethindrone (MICRONOR) 0.35 MG tablet Take 1 tablet by mouth daily. 12/12/20   [provider]  ondansetron (ZOFRAN ODT) 4 MG disintegrating tablet Take 1 tablet (4 mg total) by mouth every 8 (eight) hours as needed for nausea or vomiting. Patient not taking: No sig reported 11/08/20   Wieters, Hallie C, PA-C  polyethylene glycol (MIRALAX / GLYCOLAX) 17 g packet Take 17 g by mouth daily as needed for mild constipation.    [provider]  pantoprazole (PROTONIX) 40 MG tablet Take 1 tablet (40 mg total) by mouth daily for 14 days. Patient not taking: Reported on 01/28/2020 12/31/18 07/10/20  Cristina Gong, PA-C  sucralfate (CARAFATE) 1 g tablet Take 1 tablet (1 g total) by mouth 4 (four) times daily -  with meals and at bedtime for 14 days. Patient not taking: Reported on 01/28/2020 12/31/18 07/10/20  Cristina Gong, PA-C    Allergies    Patient has no known allergies.  Review of Systems   Review of Systems  Constitutional:  Positive for fever.  Ten systems reviewed and are negative for acute change, except as noted in the HPI.    Physical Exam Updated Vital Signs BP (!) 138/100 (BP Location: Left Arm)   Pulse 97   Temp 98.1 F (36.7 C) (Oral)    Resp 14   Ht 5\' 1"  (1.549 m)   Wt 101.6 kg   LMP 08/16/2021   SpO2 95%   BMI 42.32 kg/m   Physical Exam Vitals and nursing note reviewed.  Constitutional:      General: She is not in acute distress.    Appearance: She is well-developed. She is not diaphoretic.     Comments: Nontoxic appearing and in NAD  HENT:     Head: Normocephalic and atraumatic.  Eyes:     General: No scleral icterus.    Conjunctiva/sclera: Conjunctivae normal.  Cardiovascular:     Rate and Rhythm: Normal rate and regular rhythm.     Pulses: Normal pulses.  Pulmonary:     Effort: Pulmonary effort is normal. No respiratory distress.     Breath sounds: No stridor. No wheezing or rales.     Comments: Lungs CTAB. Respirations even and unlabored Musculoskeletal:        General: Normal range of motion.     Cervical back: Normal range of motion.  Skin:    General: Skin is warm and dry.     Coloration: Skin is not pale.     Findings: No erythema or rash.  Neurological:     Mental Status: She is alert and oriented to person, place, and time.     Coordination: Coordination normal.  Psychiatric:        Behavior: Behavior normal.    ED Results / Procedures / Treatments   Labs (all labs ordered are listed, but only abnormal results are displayed) Labs Reviewed - No data to display  EKG None  Radiology No results found.  Procedures Procedures   Medications Ordered in ED Medications  ketorolac (TORADOL) 15 MG/ML injection 15 mg (15 mg Intramuscular Given 08/31/21 2223)    ED Course  I have reviewed the triage vital signs and the nursing notes.  Pertinent labs & imaging results that were available during my care of the patient were reviewed by me and considered in my medical decision making (see chart for details).    MDM Rules/Calculators/A&P                           Patient with symptoms consistent with viral illness.  Vitals are stable, afebrile in the ED.  Lungs are clear.  No  tachypnea, dyspnea, hypoxia.  Abdomen soft, nontender.  Had negative Flu/COVID swab yesterday, reassuring UA.  Patient discharged with instructions to orally hydrate, rest, and use over-the-counter medications such as anti-inflammatories ibuprofen and Aleve for muscle aches and Tylenol for fever.  Encouraged PCP follow up.  Return precautions discussed and provided. Patient discharged in stable condition with no unaddressed concerns.   Final Clinical Impression(s) / ED Diagnoses Final diagnoses:  Flu-like symptoms    Rx / DC Orders ED Discharge Orders     None        Antony Madura, Cordelia Poche 08/31/21 2339    Zadie Rhine, MD 09/01/21 [phone]

## 2021-09-01 LAB — URINE CULTURE: Culture: <10000 — AB

## 2022-02-15 ENCOUNTER — Other Ambulatory Visit: Payer: Self-pay

## 2022-02-15 ENCOUNTER — Inpatient Hospital Stay (HOSPITAL_COMMUNITY): Payer: Medicaid - Out of State

## 2022-02-15 ENCOUNTER — Inpatient Hospital Stay (HOSPITAL_COMMUNITY)
Admission: AD | Admit: 2022-02-15 | Discharge: 2022-02-15 | Disposition: A | Discharge status: Home / Self Care | Payer: Medicaid - Out of State | Attending: Family Medicine | Admitting: Family Medicine

## 2022-02-15 ENCOUNTER — Encounter (HOSPITAL_COMMUNITY): Payer: Self-pay | Admitting: Family Medicine

## 2022-02-15 DIAGNOSIS — Z79899 Other long term (current) drug therapy: Secondary | ICD-10-CM | POA: Diagnosis not present

## 2022-02-15 DIAGNOSIS — N939 Abnormal uterine and vaginal bleeding, unspecified: Secondary | ICD-10-CM

## 2022-02-15 DIAGNOSIS — O208 Other hemorrhage in early pregnancy: Secondary | ICD-10-CM | POA: Diagnosis not present

## 2022-02-15 DIAGNOSIS — Z3A10 10 weeks gestation of pregnancy: Secondary | ICD-10-CM | POA: Diagnosis not present

## 2022-02-15 DIAGNOSIS — O10919 Unspecified pre-existing hypertension complicating pregnancy, unspecified trimester: Secondary | ICD-10-CM

## 2022-02-15 DIAGNOSIS — B9689 Other specified bacterial agents as the cause of diseases classified elsewhere: Secondary | ICD-10-CM

## 2022-02-15 DIAGNOSIS — O209 Hemorrhage in early pregnancy, unspecified: Secondary | ICD-10-CM | POA: Insufficient documentation

## 2022-02-15 DIAGNOSIS — O418X10 Other specified disorders of amniotic fluid and membranes, first trimester, not applicable or unspecified: Secondary | ICD-10-CM

## 2022-02-15 DIAGNOSIS — O468X1 Other antepartum hemorrhage, first trimester: Secondary | ICD-10-CM

## 2022-02-15 DIAGNOSIS — Z3A01 Less than 8 weeks gestation of pregnancy: Secondary | ICD-10-CM

## 2022-02-15 LAB — URINALYSIS, ROUTINE W REFLEX MICROSCOPIC
Bilirubin Urine: NEGATIVE
Glucose, UA: NEGATIVE mg/dL
Ketones, ur: 20 mg/dL — AB
Leukocytes,Ua: NEGATIVE
Nitrite: NEGATIVE
Protein, ur: 100 mg/dL — AB
RBC / HPF: >50 RBC/hpf — ABNORMAL HIGH (ref 0–5)
Specific Gravity, Urine: 1.038 — ABNORMAL HIGH (ref 1.005–1.030)
WBC, UA: >50 WBC/hpf — ABNORMAL HIGH (ref 0–5)
pH: 5 (ref 5.0–8.0)

## 2022-02-15 LAB — CBC
HCT: 35.3 % — ABNORMAL LOW (ref 36.0–46.0)
Hemoglobin: 11.4 g/dL — ABNORMAL LOW (ref 12.0–15.0)
MCH: 26.1 pg (ref 26.0–34.0)
MCHC: 32.3 g/dL (ref 30.0–36.0)
MCV: 81 fL (ref 80.0–100.0)
Platelets: 248 10*3/uL (ref 150–400)
RBC: 4.36 MIL/uL (ref 3.87–5.11)
RDW: 14.1 % (ref 11.5–15.5)
WBC: 8.3 10*3/uL (ref 4.0–10.5)
nRBC: 0 % (ref 0.0–0.2)

## 2022-02-15 LAB — WET PREP, GENITAL
Sperm: NONE SEEN
Trich, Wet Prep: NONE SEEN
WBC, Wet Prep HPF POC: <10 (ref <10)
Yeast Wet Prep HPF POC: NONE SEEN

## 2022-02-15 LAB — HCG, QUANTITATIVE, PREGNANCY: hCG, Beta Chain, Quant, S: 7976 m[IU]/mL — ABNORMAL HIGH (ref <5)

## 2022-02-15 LAB — ABO/RH: ABO/RH(D): O POS

## 2022-02-15 MED ORDER — ACETAMINOPHEN 500 MG PO TABS
1000.0000 mg | ORAL_TABLET | Freq: Once | ORAL | Status: AC
  Administered 2022-02-15: 1000 mg via ORAL
  Filled 2022-02-15: qty 2

## 2022-02-15 MED ORDER — METRONIDAZOLE 0.75 % VA GEL: 1.0000 | Freq: Two times a day (BID) | VAGINAL | 0 refills | Status: DC

## 2022-02-15 MED ORDER — ACETAMINOPHEN 325 MG PO TABS: 650.0000 mg | ORAL_TABLET | Freq: Four times a day (QID) | ORAL | 0 refills | Status: AC | PRN

## 2022-02-15 MED ORDER — LABETALOL HCL 100 MG PO TABS
100.0000 mg | ORAL_TABLET | Freq: Once | ORAL | Status: AC
  Administered 2022-02-15: 100 mg via ORAL
  Filled 2022-02-15: qty 1

## 2022-02-15 NOTE — Discharge Instructions (Signed)
You came to MAU because you were having vaginal bleeding. We did swabs which showed you have a bacterial infection and we sent you treatment to your pharmacy.  We also did an ultrasound that showed that by measurement you are about [redacted] weeks pregnant. There was a small area of bleeding under your placenta which could explain the bleeding you are having. Your pregnancy hormone level was similar to what it was a few days ago in [place] but since its two different labs we should get another hormone level in two days to see if the hormone level is going up appropriately. If it is not you will likely need another ultrasound in a week or two to reassess status of the pregnancy.  We have made you an appointment in our office to get your repeat blood work.   Please seek medical care sooner if your bleeding gets heavier, you are having episodes of dizziness or lightheadedness that are constant.

## 2022-02-15 NOTE — MAU Note (Signed)
Vanessa Foster is a 36 y.o. at [redacted]w[redacted]d here in MAU reporting: started having vaginal bleeding on Wednesday, states it was light. Bleeding has gotten heavier and reports she is changing a pad every hour and is now having abdominal pain.   LMP: 12/02/2021  Onset of complaint: ongoing  Pain score: 7/10  Vitals:   02/15/22 0807  BP: (!) 151/90  Pulse: 97  Resp: 16  Temp: 98.4 F (36.9 C)  SpO2: 100%     Lab orders placed from triage: UA

## 2022-02-15 NOTE — MAU Provider Note (Addendum)
Chief Complaint: Vaginal Bleeding and Abdominal Pain   Event Date/Time   First Provider Initiated Contact with Patient 02/15/22 0836        SUBJECTIVE HPI: Vanessa Foster is a 36 y.o. G3P2001 at [redacted]w[redacted]d by LMP who presents to maternity admissions reporting vaginal bleeding   HPI Started Wednesday Initially changing pads every 4-5 hours  Now changing twice every 2-3 hours Pain in lower abdomen Had [place] in [place] - this Tuesday (just moved here today) Two prior CS Had HCG done in [place] on 5/25 which was 8362   #Elevated BP Takes BP meds -  Labetalol 100mg  BID. Only in pregnancy. Started in earlier this month   She denies vaginal itching/burning, urinary symptoms, h/a, dizziness, n/v, or fever/chills.     Past Medical History:  Diagnosis Date   Abnormal Pap smear of cervix    2011?   Allergy    Anemia    Anxiety    Asthma    hospitalization 2008, last attack this month   Chronic nausea    Depression    Dysmenorrhea    Dyspareunia    Gestational diabetes 07/13/2016   no meds   Migraines    Mixed hyperlipidemia 02/23/2021   PID (pelvic inflammatory disease)    Wears glasses    Past Surgical History:  Procedure Laterality Date   CESAREAN SECTION     CESAREAN SECTION N/A 09/19/2016   Procedure: CESAREAN SECTION;  Surgeon: Aletha Halim, MD;  Location: [facility];  Service: Obstetrics;  Laterality: N/A;   INCISE AND DRAIN ABCESS  2019   under arms " boils"   WISDOM TOOTH EXTRACTION     Social History   Socioeconomic History   Marital status: Married    Spouse name: Not on file   Number of children: Not on file   Years of education: Not on file   Highest education level: Not on file  Occupational History   Not on file  Tobacco Use   Smoking status: Former    Types: Cigarettes    Quit date: 03/20/2008    Years since quitting: 13.9   Smokeless tobacco: Never  Vaping Use   Vaping Use: Never used  Substance and Sexual Activity   Alcohol  use: No    Alcohol/week: 0.0 standard drinks   Drug use: Yes    Types: Marijuana   Sexual activity: Yes    Partners: Male    Birth control/protection: Pill  Other Topics Concern   Not on file  Social History Narrative   Lives with fiance, has son, works as Quarry manager at [employer], exercise some with walking.  Husband was shot 2016 in chest. Brother-in-law was shot & killed later in 2015 & pending concern for murder's release in 2018.  As of 06/2015 her sister and her kids live with her and her son and husband.       Updated 03/2021 Married, 2 sons, no pets.   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   Social Determinants of Health   Financial Resource Strain: Not on file  Food Insecurity: Not on file  Transportation Needs: Not on file  Physical Activity: Not on file  Stress: Not on file  Social Connections: Not on file  Intimate Partner Violence: Not on file   No current facility-administered medications on file prior to encounter.   Current Outpatient Medications on File Prior to Encounter  Medication Sig Dispense Refill   labetalol (NORMODYNE) 100 MG tablet Take 100 mg by mouth  2 (two) times daily.     Prenatal Vit-Fe Fumarate-FA (MULTIVITAMIN-PRENATAL) 27-0.8 MG TABS tablet Take 1 tablet by mouth daily at 12 noon.     albuterol (VENTOLIN HFA) 108 (90 Base) MCG/ACT inhaler Inhale 2 puffs into the lungs every 6 (six) hours as needed for wheezing or shortness of breath. (Patient not taking: No sig reported) 8 g 0   polyethylene glycol (MIRALAX / GLYCOLAX) 17 g packet Take 17 g by mouth daily as needed for mild constipation.     [DISCONTINUED] pantoprazole (PROTONIX) 40 MG tablet Take 1 tablet (40 mg total) by mouth daily for 14 days. (Patient not taking: Reported on 01/28/2020) 14 tablet 0   [DISCONTINUED] sucralfate (CARAFATE) 1 g tablet Take 1 tablet (1 g total) by mouth 4 (four) times daily -  with meals and at bedtime for 14 days. (Patient not taking: Reported on 01/28/2020) 56 tablet 0    No Known Allergies  I have reviewed patient's Past Medical Hx, Surgical Hx, Family Hx, Social Hx, medications and allergies.   ROS:  Review of Systems  Constitutional:  Negative for fever.  Respiratory:  Negative for shortness of breath.   Cardiovascular:  Negative for chest pain.  Gastrointestinal:  Negative for abdominal pain.  Endocrine: Negative for polyuria.  Genitourinary:  Negative for dysuria.  Musculoskeletal:  Negative for back pain.  All other systems reviewed and are negative. Review of Systems  Other systems negative   Physical Exam  Physical Exam Vitals and nursing note reviewed.  Abdominal:     General: Bowel sounds are normal.     Palpations: Abdomen is soft.     Tenderness: There is no right CVA tenderness or left CVA tenderness.  Genitourinary:    Comments: Pelvic exam done with speculum, blood in vault and some clots. Cervix appears mildly open on visualization- active bleeding Skin:    Capillary Refill: Capillary refill takes less than 2 seconds.  Neurological:     General: No focal deficit present.     Mental Status: She is alert and oriented to person, place, and time.   Patient Vitals for the past 24 hrs:  BP Temp Temp src Pulse Resp SpO2 Height Weight  02/15/22 1131 134/75 -- -- 95 14 -- -- --  02/15/22 1130 -- -- -- -- -- 99 % -- --  02/15/22 1125 -- -- -- -- -- 98 % -- --  02/15/22 1121 128/80 -- -- 91 -- -- -- --  02/15/22 0925 -- -- -- -- -- 100 % -- --  02/15/22 0920 -- -- -- -- -- 99 % -- --  02/15/22 0915 -- -- -- -- -- 99 % -- --  02/15/22 0910 -- -- -- -- -- 99 % -- --  02/15/22 0905 -- -- -- -- -- 99 % -- --  02/15/22 0900 -- -- -- -- -- 99 % -- --  02/15/22 0855 -- -- -- -- -- 100 % -- --  02/15/22 0850 -- -- -- -- -- 100 % -- --  02/15/22 0845 -- -- -- -- -- 99 % -- --  02/15/22 0840 -- -- -- -- -- 99 % -- --  02/15/22 0835 -- -- -- -- -- 100 % -- --  02/15/22 0830 -- -- -- -- -- 99 % -- --  02/15/22 0825 -- -- -- -- -- 100 %  -- --  02/15/22 0820 -- -- -- -- -- 99 % -- --  02/15/22 0819 (!) 141/97 -- -- 89 -- -- -- --  02/15/22 0807 (!) 151/90 98.4 F (36.9 C) Oral 97 16 100 % -- --  02/15/22 0803 -- -- -- -- -- -- 5\' 2"  (1.575 m) 226 lb 9.6 oz (102.8 kg)    LAB RESULTS Results for orders placed or performed during the hospital encounter of 02/15/22 (from the past 24 hour(s))  Wet prep, genital     Status: Abnormal   Collection Time: 02/15/22  9:06 AM   Specimen: PATH Cytology Cervicovaginal Ancillary Only  Result Value Ref Range   Yeast Wet Prep HPF POC NONE SEEN NONE SEEN   Trich, Wet Prep NONE SEEN NONE SEEN   Clue Cells Wet Prep HPF POC PRESENT (A) NONE SEEN   WBC, Wet Prep HPF POC <10 <10   Sperm NONE SEEN   CBC     Status: Abnormal   Collection Time: 02/15/22  9:10 AM  Result Value Ref Range   WBC 8.3 4.0 - 10.5 K/uL   RBC 4.36 3.87 - 5.11 MIL/uL   Hemoglobin 11.4 (L) 12.0 - 15.0 g/dL   HCT 35.3 (L) 36.0 - 46.0 %   MCV 81.0 80.0 - 100.0 fL   MCH 26.1 26.0 - 34.0 pg   MCHC 32.3 30.0 - 36.0 g/dL   RDW 14.1 11.5 - 15.5 %   Platelets 248 150 - 400 K/uL   nRBC 0.0 0.0 - 0.2 %  hCG, quantitative, pregnancy     Status: Abnormal   Collection Time: 02/15/22  9:10 AM  Result Value Ref Range   hCG, Beta Chain, Quant, S 7,976 (H) <5 mIU/mL  ABO/Rh     Status: None   Collection Time: 02/15/22  9:10 AM  Result Value Ref Range   ABO/RH(D)      O POS Performed at [facility], [address] [place], [place] [address]   Urinalysis, Routine w reflex microscopic Urine, Clean Catch     Status: Abnormal   Collection Time: 02/15/22 12:03 PM  Result Value Ref Range   Color, Urine AMBER (A) YELLOW   APPearance CLOUDY (A) CLEAR   Specific Gravity, Urine 1.038 (H) 1.005 - 1.030   pH 5.0 5.0 - 8.0   Glucose, UA NEGATIVE NEGATIVE mg/dL   Hgb urine dipstick LARGE (A) NEGATIVE   Bilirubin Urine NEGATIVE NEGATIVE   Ketones, ur 20 (A) NEGATIVE mg/dL   Protein, ur 100 (A) NEGATIVE mg/dL   Nitrite  NEGATIVE NEGATIVE   Leukocytes,Ua NEGATIVE NEGATIVE   RBC / HPF >50 (H) 0 - 5 RBC/hpf   WBC, UA >50 (H) 0 - 5 WBC/hpf   Bacteria, UA RARE (A) NONE SEEN   Squamous Epithelial / LPF 11-20 0 - 5   Mucus PRESENT     --/--/O POS Performed at [facility], [place] [address], [place], [place] [address]  [id number]/28 0910)  IMAGING US OB LESS THAN 14 WEEKS WITH OB TRANSVAGINAL  Result Date: 02/15/2022 CLINICAL DATA:  Vaginal bleeding and pain. EXAM: OBSTETRIC <14 WK [place] AND TRANSVAGINAL OB US TECHNIQUE: Both transabdominal and transvaginal ultrasound examinations were performed for complete evaluation of the gestation as well as the maternal uterus, adnexal regions, and pelvic cul-de-sac. Transvaginal technique was performed to assess early pregnancy. COMPARISON:  None Available. FINDINGS: Intrauterine gestational sac: Single Yolk sac:  Visualized. Embryo:  Not Visualized. MSD: 16.6 mm   6 w   3 d Subchorionic hemorrhage:  Small subchorionic hemorrhage. Maternal uterus/adnexae: Normal IMPRESSION: 1. Intrauterine gestational sac with a possible yolk sac. No fetal  pole identified, which may be secondary to early gestation. Follow-up with serial beta HCG and sonogram as clinically warranted. 2. Bilateral adnexa are unremarkable. Electronically Signed   By: Keane Police D.O.   On: 02/15/2022 10:04    MAU Management/MDM: I have reviewed the triage vital signs and the nursing notes.   Pertinent labs & imaging results that were available during my care of the patient were reviewed by me and considered in my medical decision making (see chart for details).   I have reviewed her medical records including past results, notes and treatments.   Ordered usual first trimester r/o ectopic labs.   Pelvic exam and cultures done Will check baseline Ultrasound to rule out ectopic.   This bleeding/pain can represent a normal pregnancy with bleeding, spontaneous abortion or even an ectopic which can be life-threatening.   The process as listed above helps to determine which of these is present.    ASSESSMENT/Plan 1. Vaginal bleeding 2. Subchorionic hemorrhage IUP with subchorionic hemorrhage and HCG that is steady for last HCG done 3 days ago in [place] (where patient just relocated from). Trend 8362(5/25)-->7976(today-5/28). No cardiac activity and given HCG did not uptrend appropriately possible threatened miscarriage. Patient hemodynamically stable.  -Discussed repeat hcg in 48 hours in office (scheduled at [facility] and provided information to patient) - depending on results could be diagnostic for failed pregnancy and gave patient this information - Discussed regarding trajectory and expectations of subchorionic hemorrhage   Discharge home Plan to repeat HCG level in 48 hours in clinic per 11:00 am schedule Will repeat  Ultrasound in about 7-10 days if HCG levels double appropriately  Ectopic precautions  3. cHTN On Labetalol 100mg  BID. No hypertensive symptoms today. Initial BP 140s-150s, improved with dose of home labetalol (had not taken dose today)  Pt stable at time of discharge. Encouraged to return here if she develops worsening of symptoms, increase in pain, fever, or other concerning symptoms.   4. BV Metrogel sent to pharmacy of choice   Renard Matter, MD, MPH OB Fellow, Faculty Practice

## 2022-02-17 ENCOUNTER — Inpatient Hospital Stay (HOSPITAL_COMMUNITY)
Admission: AD | Admit: 2022-02-17 | Discharge: 2022-02-17 | Disposition: A | Discharge status: Home / Self Care | Payer: Medicaid - Out of State | Attending: Obstetrics and Gynecology | Admitting: Obstetrics and Gynecology

## 2022-02-17 ENCOUNTER — Inpatient Hospital Stay (HOSPITAL_COMMUNITY): Payer: Medicaid - Out of State

## 2022-02-17 ENCOUNTER — Other Ambulatory Visit: Payer: Medicaid - Out of State

## 2022-02-17 ENCOUNTER — Encounter (HOSPITAL_COMMUNITY): Payer: Self-pay | Admitting: Obstetrics and Gynecology

## 2022-02-17 DIAGNOSIS — O039 Complete or unspecified spontaneous abortion without complication: Secondary | ICD-10-CM | POA: Diagnosis present

## 2022-02-17 LAB — GC/CHLAMYDIA PROBE AMP (~~LOC~~) NOT AT ARMC
Chlamydia: NEGATIVE
Comment: NEGATIVE
Comment: NORMAL
Neisseria Gonorrhea: NEGATIVE

## 2022-02-17 LAB — CBC
HCT: 35.2 % — ABNORMAL LOW (ref 36.0–46.0)
Hemoglobin: 11.5 g/dL — ABNORMAL LOW (ref 12.0–15.0)
MCH: 26.4 pg (ref 26.0–34.0)
MCHC: 32.7 g/dL (ref 30.0–36.0)
MCV: 80.9 fL (ref 80.0–100.0)
Platelets: 269 10*3/uL (ref 150–400)
RBC: 4.35 MIL/uL (ref 3.87–5.11)
RDW: 14.1 % (ref 11.5–15.5)
WBC: 9.9 10*3/uL (ref 4.0–10.5)
nRBC: 0 % (ref 0.0–0.2)

## 2022-02-17 LAB — HCG, QUANTITATIVE, PREGNANCY: hCG, Beta Chain, Quant, S: 4283 m[IU]/mL — ABNORMAL HIGH (ref <5)

## 2022-02-17 MED ORDER — OXYCODONE-ACETAMINOPHEN 5-325 MG PO TABS
2.0000 | ORAL_TABLET | Freq: Once | ORAL | Status: AC
  Administered 2022-02-17: 2 via ORAL
  Filled 2022-02-17: qty 2

## 2022-02-17 NOTE — MAU Note (Addendum)
.  Vanessa Foster is a 36 y.o. at [redacted]w[redacted]d here in MAU reporting: bright red heavy VB for the last 7 days and ABD cramping started today and clotting that started two days ago. Pt wearing pad and has changed 4 today. Pt stated she went to her OB office two days ago for a lab check and they were rising however was told to come back for recheck tomorrow. Pt reports when she went to the bathroom around 0330 today she saw clot size of small orange. Pt starts she has been leaking fluid since she found out she was pregnant. Pt denies intercourse and abnormal discharge.  Pt lives in [place] where she started her care, however, moved to [place] due to housing issues in [place] to stay with family.  Onset of complaint: 0330 Pain score: 10/10 Vitals:   02/17/22 0522  BP: 113/76  Pulse: 96  Resp: 20  Temp: 98.3 F (36.8 C)  SpO2: 100%      Lab orders placed from triage:

## 2022-02-17 NOTE — MAU Provider Note (Signed)
History     CSN: [id number]  Arrival date and time: 02/17/22 0507   Event Date/Time   First Provider Initiated Contact with Patient 02/17/22 [phone]      Chief Complaint  Patient presents with   Vaginal Bleeding   Abdominal Pain   Vanessa Foster is a 36 y.o. G3P2002 at [redacted]w[redacted]d by LMP.  She presents today for Vaginal Bleeding and Abdominal Pain.  She states she awoke, around 0300, in pain and attempted to use the bathroom when she noticed increased bleeding.  She states the pain is located in "the bottom part of my stomach."  She describes the pain "like a contraction" and reports it "comes and goes and gets worser."  She reports using 3 pads today and endorses passing clots about the size of a kiwi.  She endorses that the pain has been present for about 7 days.     OB History     Gravida  3   Para  2   Term  2   Preterm      AB      Living  1      SAB      IAB      Ectopic      Multiple      Live Births  1           Past Medical History:  Diagnosis Date   Abnormal Pap smear of cervix    2011?   Allergy    Anemia    Anxiety    Asthma    hospitalization 2008, last attack this month   Chronic nausea    Depression    Dysmenorrhea    Dyspareunia    Gestational diabetes 07/13/2016   no meds   Migraines    Mixed hyperlipidemia 02/23/2021   PID (pelvic inflammatory disease)    Wears glasses     Past Surgical History:  Procedure Laterality Date   CESAREAN SECTION     CESAREAN SECTION N/A 09/19/2016   Procedure: CESAREAN SECTION;  Surgeon: Rockmart Bing, MD;  [facility];  Service: Obstetrics;  Laterality: N/A;   INCISE AND DRAIN ABCESS  2019   under arms " boils"   WISDOM TOOTH EXTRACTION      Family History  Problem Relation Age of Onset   Hypertension Mother    Heart disease Mother        early 35s   Diabetes Mother    Diabetes Paternal Grandmother    Stroke Paternal Grandmother     Social History   Tobacco Use    Smoking status: Former    Types: Cigarettes    Quit date: 03/20/2008    Years since quitting: 13.9   Smokeless tobacco: Never  Vaping Use   Vaping Use: Never used  Substance Use Topics   Alcohol use: No    Alcohol/week: 0.0 standard drinks   Drug use: Yes    Types: Marijuana    Allergies: No Known Allergies  Medications Prior to Admission  Medication Sig Dispense Refill Last Dose   acetaminophen (TYLENOL) 325 MG tablet Take 2 tablets (650 mg total) by mouth every 6 (six) hours as needed. 30 tablet 0    albuterol (VENTOLIN HFA) 108 (90 Base) MCG/ACT inhaler Inhale 2 puffs into the lungs every 6 (six) hours as needed for wheezing or shortness of breath. (Patient not taking: No sig reported) 8 g 0    labetalol (NORMODYNE) 100 MG tablet Take 100 mg by  mouth 2 (two) times daily.      metroNIDAZOLE (METROGEL VAGINAL) 0.75 % vaginal gel Place 1 Applicatorful vaginally 2 (two) times daily. 70 g 0    polyethylene glycol (MIRALAX / GLYCOLAX) 17 g packet Take 17 g by mouth daily as needed for mild constipation.      Prenatal Vit-Fe Fumarate-FA (MULTIVITAMIN-PRENATAL) 27-0.8 MG TABS tablet Take 1 tablet by mouth daily at 12 noon.       Review of Systems  Gastrointestinal:  Positive for abdominal pain and nausea. Negative for vomiting.  Genitourinary:  Positive for vaginal bleeding. Negative for difficulty urinating, dysuria and vaginal discharge.  Neurological:  Positive for dizziness and headaches. Negative for light-headedness.  Physical Exam   Blood pressure 113/76, pulse 96, temperature 98.3 F (36.8 C), temperature source Oral, resp. rate 20, last menstrual period 12/02/2021, SpO2 100 %.  Physical Exam Vitals reviewed.  Constitutional:      General: She is in acute distress.     Appearance: She is well-developed. She is obese. She is not toxic-appearing.  HENT:     Head: Normocephalic and atraumatic.  Eyes:     Conjunctiva/sclera: Conjunctivae normal.  Cardiovascular:     Rate  and Rhythm: Normal rate and regular rhythm.  Pulmonary:     Effort: Pulmonary effort is normal. No respiratory distress.  Genitourinary:    Comments: Small amt blood noted on peri-pad. Mandarin sized clot passed and collected.  Musculoskeletal:        General: Normal range of motion.     Cervical back: Normal range of motion.  Skin:    General: Skin is warm and dry.  Neurological:     Mental Status: She is alert and oriented to person, place, and time.  Psychiatric:        Mood and Affect: Mood normal.        Behavior: Behavior normal.    MAU Course  Procedures Results for orders placed or performed during the hospital encounter of 02/17/22 (from the past 24 hour(s))  CBC     Status: Abnormal   Collection Time: 02/17/22  5:42 AM  Result Value Ref Range   WBC 9.9 4.0 - 10.5 K/uL   RBC 4.35 3.87 - 5.11 MIL/uL   Hemoglobin 11.5 (L) 12.0 - 15.0 g/dL   HCT 04.535.2 (L) 40.936.0 - 81.146.0 %   MCV 80.9 80.0 - 100.0 fL   MCH 26.4 26.0 - 34.0 pg   MCHC 32.7 30.0 - 36.0 g/dL   RDW 91.414.1 78.211.5 - 95.615.5 %   Platelets 269 150 - 400 K/uL   nRBC 0.0 0.0 - 0.2 %   US OB Transvaginal  Result Date: 02/17/2022 CLINICAL DATA:  Vaginal bleeding and pain EXAM: TRANSVAGINAL OB ULTRASOUND TECHNIQUE: Transvaginal ultrasound was performed for complete evaluation of the gestation as well as the maternal uterus, adnexal regions, and pelvic cul-de-sac. COMPARISON:  Limited Ob ultrasound 02/15/2022, showing a 6 weeks 3 days gestational sac with yolk sac without visible fetal pole. FINDINGS: Intrauterine gestational sac: No longer visible. Yolk sac:  Not seen. Embryo:  Not seen. Cardiac Activity: N/a Heart Rate: N/a Subchorionic hemorrhage:  None visualized. Maternal uterus/adnexae: Anteverted uterus is again noted. No wall mass is seen. The endometrial echo complex is now again visible measuring 7.1 mm diameter. There is a heterogeneous avascular mass distending the endocervical canal up to 3.1 cm in diameter with mass  measuring 2.7 x 4.5 by 2.9 cm, consistent with a blood clot or products of nonviable conception.  Both ovaries are sonographically unremarkable by grayscale but were not evaluated for Doppler flow. No free fluid or adnexal mass is seen. IMPRESSION: Heterogeneous mass with no visible color flow measuring 2.7 x 4.5 x 2.9 cm distending the cervix, findings consistent with abortion in progress. No evidence of a living pregnancy. Electronically Signed   By: Almira Bar M.D.   On: 02/17/2022 06:29     MDM Pelvic Exam Labs: CBC, hCG Ultrasound Pain Medication Assessment and Plan  36 year old G3P2002 at 6.5 weeks Suspected Miscarriage  -Reviewed POC with patient. -Patient passed, mandarian sized clot, c/w GS during discussion of POC. Collected and sent to pathology.  -Will give pain medication. Patient agreeable. -Percocet 2 tablets ordered. -Labs ordered -US to assess uterine contents.   Cherre Robins 02/17/2022, 5:26 AM   Reassessment (6:19 AM) -[place] results pending. -Review of images shows no GS which is c/w miscarriage. -Provider to bedside to inform patient of initial findings. -Condolences given. -Informed that will await formal report which will provide information on endometrial thickness which would determine necessity for Cytotec dosing. -Reviewed Cytotec usage and potential effects. -Bleeding precautions given. -Discussed follow up in 2 weeks with repeat hCG. -Message sent to [facility] for cancellation of lab appt today and for scheduling of follow up.   Reassessment (6:38 AM) -Formal US confirms no viable pregnancy -Questionable blood clot vs POC in endometrial canal. -Nurse instructed to reiterate bleeding precautions and encourage patient to call primary office or return to MAU if symptoms worsen or with the onset of new symptoms. -Discharged to home in stable condition.  Cherre Robins MSN, CNM Advanced Practice Provider, [facility] [employer]

## 2022-02-19 LAB — SURGICAL PATHOLOGY

## 2022-03-03 ENCOUNTER — Encounter: Payer: Self-pay | Admitting: Family Medicine

## 2022-03-03 ENCOUNTER — Ambulatory Visit (INDEPENDENT_AMBULATORY_CARE_PROVIDER_SITE_OTHER): Payer: Self-pay | Admitting: Family Medicine

## 2022-03-03 VITALS — BP 159/111 | HR 95 | Wt 222.0 lb

## 2022-03-03 DIAGNOSIS — O039 Complete or unspecified spontaneous abortion without complication: Secondary | ICD-10-CM

## 2022-03-03 HISTORY — DX: Complete or unspecified spontaneous abortion without complication: O03.9

## 2022-03-03 NOTE — Progress Notes (Signed)
Patient here to follow up from recent miscarriage. She informed me that she is no longer bleeding but has mild pelvic cramps. She also informed me that she has not been taking Labetalol for the past few days   Bruinsma was visibly upset and referral to Behavior Health was offered to patient and she accepted.     Kathleene Bergemann, CMA

## 2022-03-03 NOTE — Assessment & Plan Note (Signed)
Reassured patient that miscarriage is common with ~1/4 of women experiencing it in their lifetime. Reassured patient that there is nothing she did or did not do to cause this. Reviewed most common reason is presumed to be genetic abnormalities that allow a pregnancy to start but not continue past an early stage, but realistically we do not know the cause in most cases. Patient not planning on further pregnancies. Blood type --/--/O POS Performed at [facility], [address], [place], [place] [address]  [phone]), rhogam was not indicated. Patient was interested in contraception, leaning towards LARC but will take some time to decide.   Patient appropriately upset during visit, accepts referral to [facility].   After visit noted that patient was due for a pap, will schedule patient recall message to front desk for one month from now so that she has time to grieve, can also discuss contraception at that time as well.

## 2022-03-03 NOTE — Progress Notes (Signed)
GYNECOLOGY OFFICE VISIT NOTE  History:   Vanessa Foster is a 36 y.o. G3P2001 here today for SAB follow up.  Seen for vaginal bleeding in MAU on 02/15/2022, US showed IUP with only yolk sac visible Subsequently seen on again on 02/17/2022, confirmed SAB on [place] at that time. Tissue sent to pathology did not show any chorionic villi  Today reports she has minimal cramping and no bleeding Feeling very sad about her miscarriage Not planning to get pregnant again anytime soon Not ready to start birth control but does want to know about options  Health Maintenance Due  Topic Date Due   URINE MICROALBUMIN  Never done   COVID-19 Vaccine (3 - Moderna risk series) 02/06/2021    Past Medical History:  Diagnosis Date   Abnormal Pap smear of cervix    2011?   Allergy    Anemia    Anxiety    Asthma    hospitalization 2008, last attack this month   Chronic nausea    Depression    Dysmenorrhea    Dyspareunia    Gestational diabetes 07/13/2016   no meds   Migraines    Mixed hyperlipidemia 02/23/2021   PID (pelvic inflammatory disease)    Wears glasses     Past Surgical History:  Procedure Laterality Date   CESAREAN SECTION     CESAREAN SECTION N/A 09/19/2016   Procedure: CESAREAN SECTION;  Surgeon: Optima Bing, MD;  [facility];  Service: Obstetrics;  Laterality: N/A;   INCISE AND DRAIN ABCESS  2019   under arms " boils"   WISDOM TOOTH EXTRACTION      The following portions of the patient's history were reviewed and updated as appropriate: allergies, current medications, past family history, past medical history, past social history, past surgical history and problem list.   Health Maintenance:   Last pap: Lab Results  Component Value Date   DIAGPAP  01/06/2018    NEGATIVE FOR INTRAEPITHELIAL LESIONS OR MALIGNANCY.   DIAGPAP  01/06/2018    FUNGAL ORGANISMS PRESENT CONSISTENT WITH CANDIDA SPP.    Last mammogram:  N/a    Review of Systems:   Pertinent items noted in HPI and remainder of comprehensive ROS otherwise negative.  Physical Exam:  BP (!) 159/111   Pulse 95   Wt 222 lb (100.7 kg)   LMP 12/02/2021   BMI 40.60 kg/m  CONSTITUTIONAL: Well-developed, well-nourished female in no acute distress.  HEENT:  Normocephalic, atraumatic. External right and left ear normal. No scleral icterus.  NECK: Normal range of motion, supple, no masses noted on observation SKIN: No rash noted. Not diaphoretic. No erythema. No pallor. MUSCULOSKELETAL: Normal range of motion. No edema noted. NEUROLOGIC: Alert and oriented to person, place, and time. Normal muscle tone coordination.  PSYCHIATRIC: Normal mood and affect. Normal behavior. Normal judgment and thought content. RESPIRATORY: Effort normal, no problems with respiration noted   Labs and Imaging No results found for this or any previous visit (from the past 168 hour(s)). US OB Transvaginal  Result Date: 02/17/2022 CLINICAL DATA:  Vaginal bleeding and pain EXAM: TRANSVAGINAL OB ULTRASOUND TECHNIQUE: Transvaginal ultrasound was performed for complete evaluation of the gestation as well as the maternal uterus, adnexal regions, and pelvic cul-de-sac. COMPARISON:  Limited Ob ultrasound 02/15/2022, showing a 6 weeks 3 days gestational sac with yolk sac without visible fetal pole. FINDINGS: Intrauterine gestational sac: No longer visible. Yolk sac:  Not seen. Embryo:  Not seen. Cardiac Activity: N/a Heart Rate: N/a  Subchorionic hemorrhage:  None visualized. Maternal uterus/adnexae: Anteverted uterus is again noted. No wall mass is seen. The endometrial echo complex is now again visible measuring 7.1 mm diameter. There is a heterogeneous avascular mass distending the endocervical canal up to 3.1 cm in diameter with mass measuring 2.7 x 4.5 by 2.9 cm, consistent with a blood clot or products of nonviable conception. Both ovaries are sonographically unremarkable by grayscale but were not evaluated  for Doppler flow. No free fluid or adnexal mass is seen. IMPRESSION: Heterogeneous mass with no visible color flow measuring 2.7 x 4.5 x 2.9 cm distending the cervix, findings consistent with abortion in progress. No evidence of a living pregnancy. Electronically Signed   By: Almira Bar M.D.   On: 02/17/2022 06:29   US OB LESS THAN 14 WEEKS WITH OB TRANSVAGINAL  Result Date: 02/15/2022 CLINICAL DATA:  Vaginal bleeding and pain. EXAM: OBSTETRIC <14 WK [place] AND TRANSVAGINAL OB US TECHNIQUE: Both transabdominal and transvaginal ultrasound examinations were performed for complete evaluation of the gestation as well as the maternal uterus, adnexal regions, and pelvic cul-de-sac. Transvaginal technique was performed to assess early pregnancy. COMPARISON:  None Available. FINDINGS: Intrauterine gestational sac: Single Yolk sac:  Visualized. Embryo:  Not Visualized. MSD: 16.6 mm   6 w   3 d Subchorionic hemorrhage:  Small subchorionic hemorrhage. Maternal uterus/adnexae: Normal IMPRESSION: 1. Intrauterine gestational sac with a possible yolk sac. No fetal pole identified, which may be secondary to early gestation. Follow-up with serial beta HCG and sonogram as clinically warranted. 2. Bilateral adnexa are unremarkable. Electronically Signed   By: Larose Hires D.O.   On: 02/15/2022 10:04      Assessment and Plan:   Problem List Items Addressed This Visit       Other   Miscarriage - Primary    Reassured patient that miscarriage is common with ~1/4 of women experiencing it in their lifetime. Reassured patient that there is nothing she did or did not do to cause this. Reviewed most common reason is presumed to be genetic abnormalities that allow a pregnancy to start but not continue past an early stage, but realistically we do not know the cause in most cases. Patient not planning on further pregnancies. Blood type --/--/O POS Performed at [facility], [address] [place] [address], [place], [place] [address]  [phone]  [phone]), rhogam was not indicated. Patient was interested in contraception, leaning towards LARC but will take some time to decide.   Patient appropriately upset during visit, accepts referral to [facility].   After visit noted that patient was due for a pap, will schedule patient recall message to front desk for one month from now so that she has time to grieve, can also discuss contraception at that time as well.       Relevant Orders   Ambulatory referral to Integrated Behavioral Health    Routine preventative health maintenance measures emphasized. Please refer to After Visit Summary for other counseling recommendations.   Return in about 4 weeks (around 03/31/2022) for repeat pap, birth control.    Total face-to-face time with patient: 20 minutes.  Over 50% of encounter was spent on counseling and coordination of care.   Venora Maples, MD/MPH Attending Family Medicine Physician, [facility] for [facility], [facility]

## 2022-03-09 NOTE — BH Specialist Note (Deleted)
Integrated Behavioral Health via Telemedicine Visit  03/09/2022 Vanessa Foster [id number]  Number of Integrated Behavioral Health Clinician visits: No data recorded Session Start time: No data recorded  Session End time: No data recorded Total time in minutes: No data recorded  Referring Provider: *** Patient/Family location: *** [facility] Provider location: *** All persons participating in visit: *** Types of Service: {CHL AMB TYPE OF [phone]}  I connected with Vanessa Foster and/or Vanessa Foster's {family members:20773} via  Telephone or Video Enabled Telemedicine Application  (Video is Caregility application) and verified that I am speaking with the correct person using two identifiers. Discussed confidentiality: {YES/NO:21197}  I discussed the limitations of telemedicine and the availability of in person appointments.  Discussed there is a possibility of technology failure and discussed alternative modes of communication if that failure occurs.  I discussed that engaging in this telemedicine visit, they consent to the provision of behavioral healthcare and the services will be billed under their insurance.  [name] expressed understanding and consented to Telemedicine visit: {YES/NO:21197}  Presenting Concerns: Patient and/or family reports the following symptoms/concerns: *** Duration of problem: ***; Severity of problem: {Mild/Moderate/Severe:20260}  Patient and/or Family's Strengths/Protective Factors: {CHL AMB BH PROTECTIVE [phone]}  Goals Addressed: Patient will:  Reduce symptoms of: {IBH Symptoms:21014056}   Increase knowledge and/or ability of: {IBH Patient Tools:21014057}   Demonstrate ability to: {IBH Goals:21014053}  Progress towards Goals: {CHL AMB BH PROGRESS TOWARDS GOALS:[phone]}  Interventions: Interventions utilized:  {IBH Interventions:21014054} Standardized Assessments completed: {IBH  Screening Tools:21014051}  Patient and/or Family Response: ***  Assessment: Patient currently experiencing ***.   Patient may benefit from ***.  Plan: Follow up with behavioral health clinician on : *** Behavioral recommendations: *** Referral(s): {IBH Referrals:21014055}  I discussed the assessment and treatment plan with the patient and/or parent/guardian. They were provided an opportunity to ask questions and all were answered. They agreed with the plan and demonstrated an understanding of the instructions.   They were advised to call back or seek an in-person evaluation if the symptoms worsen or if the condition fails to improve as anticipated.  Vanessa Close Nakiah Osgood, LCSW

## 2022-03-23 NOTE — BH Specialist Note (Signed)
Integrated Behavioral Health via Telemedicine Visit  04/06/2022 Vanessa Foster [id number]  Number of Integrated Behavioral Health Clinician visits: 1- Initial Visit  Session Start time: 0820   Session End time: 0835  Total time in minutes: 15   Referring Provider: Merian Capron, MD Patient/Family location: Home [facility] Provider location: [facility]  All persons participating in visit: [name] and [facility] Vanessa Foster   Types of Service: Individual psychotherapy and Video visit  I connected with Vanessa Foster and/or Vanessa Foster's  n/a  via  Telephone or Video Enabled Telemedicine Application  (Video is Caregility application) and verified that I am speaking with the correct person using two identifiers. Discussed confidentiality: Yes   I discussed the limitations of telemedicine and the availability of in person appointments.  Discussed there is a possibility of technology failure and discussed alternative modes of communication if that failure occurs.  I discussed that engaging in this telemedicine visit, they consent to the provision of behavioral healthcare and the services will be billed under their insurance.  [name] expressed understanding and consented to Telemedicine visit: Yes ; Pt consents to 15 minute only, as uninsured in [place]  Presenting Concerns: Patient and/or family reports the following symptoms/concerns: Increased feelings of depression after pregnancy loss; stress of moving back to [place] from [place]; goal is to go back to school for nursing degree.  Duration of problem: Less than 2 months  Patient and/or Family's Strengths/Protective Factors: Social connections, Concrete supports in place (healthy food, safe environments, etc.), and Sense of purpose  Goals Addressed: Patient will:  Reduce symptoms of: anxiety, depression, and stress   Increase  knowledge and/or ability of: healthy habits and stress reduction   Demonstrate ability to: Increase healthy adjustment to current life circumstances and Increase adequate support systems for patient/family  Progress towards Goals: Ongoing  Interventions: Interventions utilized:  Solution-Focused Strategies and Link to [employer] Standardized Assessments completed: Not Needed  Patient and/or Family Response: Patient agrees with treatment plan.   Assessment: Patient currently experiencing Major depressive disorder, recurrent, unspecified severity and Grief.   Patient may benefit from psychoeducation and brief therapeutic interventions regarding coping with symptoms of depression, anxiety, life stress .  Plan: Follow up with behavioral health clinician on : Call Vanessa Foster at [phone], as needed. Behavioral recommendations:  -Continue taking prenatal vitamin until next medical visit  -Accept referral to [facility] for outpatient services; use walk-in for Urgent Care and/or to establish care as needed -Accept referral to [facility] in [place] -Continue prioritizing healthy self-care daily during this time -Consider researching options for nursing school in the area ([employer], [employer], [employer], etc) to find best option for you -Consider pregnancy loss support group of choice (listed on After Visit Summary) Referral(s): Integrated Art gallery manager (In Clinic), Community Mental Health Services (LME/Outside Clinic), and Community Resources:  [facility]; Pregnancy loss support groups  I discussed the assessment and treatment plan with the patient and/or parent/guardian. They were provided an opportunity to ask questions and all were answered. They agreed with the plan and demonstrated an understanding of the instructions.   They were advised to call back or seek an in-person evaluation if the symptoms worsen or if the condition fails to improve as anticipated.  Vanessa Close Quinisha Mould, LCSW     03/03/2022    10:46 AM 02/21/2021    2:58 PM 01/09/2021    3:00 PM 01/06/2018   11:53 AM 11/09/2016  8:44 AM  Depression screen PHQ 2/9  Decreased Interest 3 0 0 3 1  Down, Depressed, Hopeless 3 0 0 2 1  PHQ - 2 Score 6 0 0 5 2  Altered sleeping 3   3 2   Tired, decreased energy 3   3 2   Change in appetite 3   3 1   Feeling bad or failure about yourself  3   3 1   Trouble concentrating 3   2 1   Moving slowly or fidgety/restless 0   0 0  Suicidal thoughts 1   1 0  PHQ-9 Score 22   20 9       03/03/2022   10:47 AM 01/06/2018   11:53 AM 11/09/2016    8:45 AM 10/26/2016   10:35 AM  GAD 7 : Generalized Anxiety Score  Nervous, Anxious, on Edge 2 3 2 2   Control/stop worrying 3 3 2 2   Worry too much - different things 3 3 2 3   Trouble relaxing 3 3 1 3   Restless 2  2 2   Easily annoyed or irritable 3 2 1 3   Afraid - awful might happen 2 3 0 2  Total GAD 7 Score 18  10 17

## 2022-04-06 ENCOUNTER — Ambulatory Visit: Payer: Self-pay | Admitting: Clinical

## 2022-04-06 DIAGNOSIS — F339 Major depressive disorder, recurrent, unspecified: Secondary | ICD-10-CM

## 2022-04-06 DIAGNOSIS — F4321 Adjustment disorder with depressed mood: Secondary | ICD-10-CM

## 2022-04-06 NOTE — Patient Instructions (Signed)
[facility] at [facility] [address] [place], [place] [address] [phone] (main office) [phone] (Dea Bitting's office)  Pregnancy Loss Support Groups [email or web address] 

## 2022-05-05 NOTE — BH Specialist Note (Signed)
Integrated Behavioral Health via Telemedicine Visit  05/14/2022 [name] [id number]  Number of Integrated Behavioral Health Clinician visits: 2- Second Visit  Session Start time: 0845   Session End time: 0918  Total time in minutes: 33   Referring Provider: Merian Capron, MD Patient/Family location: Home [facility] Provider location: [facility]  All persons participating in visit: [name] and [name]    Types of Service: Individual psychotherapy and Video visit  I connected with Vanessa Foster and/or Vanessa Foster's  n/a  via  Telephone or Video Enabled Telemedicine Application  (Video is Caregility application) and verified that I am speaking with the correct person using two identifiers. Discussed confidentiality: Yes   I discussed the limitations of telemedicine and the availability of in person appointments.  Discussed there is a possibility of technology failure and discussed alternative modes of communication if that failure occurs.  I discussed that engaging in this telemedicine visit, they consent to the provision of behavioral healthcare and the services will be billed under their insurance.  [name] expressed understanding and consented to Telemedicine visit: Yes   Presenting Concerns: Patient and/or family reports the following symptoms/concerns: Anxiety, depression, life stress; Coping by attending pregnancy loss support group, DV support group; working on life goals. Duration of problem: Over two months  Patient and/or Family's Strengths/Protective Factors: Social connections, Concrete supports in place (healthy food, safe environments, etc.), Sense of purpose, and Physical Health (exercise, healthy diet, medication compliance, etc.)  Goals Addressed: Patient will:  Reduce symptoms of: anxiety, depression, and stress    Demonstrate  ability to: Increase healthy adjustment to current life circumstances  Progress towards Goals: Ongoing  Interventions: Interventions utilized:  Supportive Reflection Standardized Assessments completed: GAD-7 and PHQ 9  Patient and/or Family Response: Patient agrees with treatment plan.   Assessment: Patient currently experiencing Major depressive disorder, recurrent, moderate.   Patient may benefit from psychoeducation and brief therapeutic interventions regarding coping with symptoms of depression, anxiety and life stress .  Plan: Follow up with behavioral health clinician on : Call Yaziel Brandon at [phone], as needed. Behavioral recommendations:  -Continue plan to attend both Pregnancy Loss support and DV support groups -Continue with plan to establish care with PCP  Referral(s): Integrated [employer] (In Clinic)  I discussed the assessment and treatment plan with the patient and/or parent/guardian. They were provided an opportunity to ask questions and all were answered. They agreed with the plan and demonstrated an understanding of the instructions.   They were advised to call back or seek an in-person evaluation if the symptoms worsen or if the condition fails to improve as anticipated.  Valetta Close Kayde Warehime, LCSW     05/13/2022    3:52 PM 03/03/2022   10:46 AM 02/21/2021    2:58 PM 01/09/2021    3:00 PM 01/06/2018   11:53 AM  Depression screen PHQ 2/9  Decreased Interest 1 3 0 0 3  Down, Depressed, Hopeless 1 3 0 0 2  PHQ - 2 Score 2 6 0 0 5  Altered sleeping 2 3   3   Tired, decreased energy 2 3   3   Change in appetite 2 3   3   Feeling bad or failure about yourself  2 3   3   Trouble concentrating 2 3   2   Moving slowly or fidgety/restless 1 0   0  Suicidal thoughts 0 1   1  PHQ-9 Score 13 22   20   Difficult doing work/chores Not difficult at all          05/13/2022    3:52 PM 03/03/2022   10:47 AM 01/06/2018   11:53 AM 11/09/2016    8:45 AM  GAD 7 :  Generalized Anxiety Score  Nervous, Anxious, on Edge 1 2 3 2   Control/stop worrying 2 3 3 2   Worry too much - different things 2 3 3 2   Trouble relaxing 2 3 3 1   Restless 2 2  2   Easily annoyed or irritable 2 3 2 1   Afraid - awful might happen 1 2 3  0  Total GAD 7 Score 12 18  10   Anxiety Difficulty Not difficult at all

## 2022-05-13 ENCOUNTER — Encounter: Payer: Self-pay | Admitting: Family Medicine

## 2022-05-13 ENCOUNTER — Other Ambulatory Visit: Payer: Self-pay

## 2022-05-13 ENCOUNTER — Other Ambulatory Visit (HOSPITAL_COMMUNITY)
Admission: RE | Admit: 2022-05-13 | Discharge: 2022-05-13 | Disposition: A | Discharge status: Home / Self Care | Payer: Medicaid Other | Source: Ambulatory Visit | Attending: Family Medicine | Admitting: Family Medicine

## 2022-05-13 ENCOUNTER — Ambulatory Visit (INDEPENDENT_AMBULATORY_CARE_PROVIDER_SITE_OTHER): Payer: Medicaid Other | Admitting: Family Medicine

## 2022-05-13 VITALS — BP 167/128 | HR 99 | Ht 62.0 in | Wt 223.6 lb

## 2022-05-13 DIAGNOSIS — Z23 Encounter for immunization: Secondary | ICD-10-CM

## 2022-05-13 DIAGNOSIS — Z113 Encounter for screening for infections with a predominantly sexual mode of transmission: Secondary | ICD-10-CM

## 2022-05-13 DIAGNOSIS — Z7251 High risk heterosexual behavior: Secondary | ICD-10-CM

## 2022-05-13 DIAGNOSIS — Z124 Encounter for screening for malignant neoplasm of cervix: Secondary | ICD-10-CM | POA: Insufficient documentation

## 2022-05-13 MED ORDER — METRONIDAZOLE 0.75 % VA GEL: 1.0000 | Freq: Two times a day (BID) | VAGINAL | 0 refills | Status: DC

## 2022-05-13 MED ORDER — METRONIDAZOLE 500 MG PO TABS: 500.0000 mg | ORAL_TABLET | Freq: Two times a day (BID) | ORAL | 0 refills | Status: AC

## 2022-05-13 NOTE — Progress Notes (Signed)
GYNECOLOGY OFFICE VISIT NOTE  History:   Vanessa Foster is a 36 y.o. G3P2001 here today for multiple issues.  Due for pap, amenable with collection today  Requests STI screening as well  Plan to discuss birth control today after last visit She is still uncertain about method Has appt to see PCP on Tuesday to discuss her blood pressure No history of blood clots Had negative pregnancy test a few days ago but LMP was about a month ago and she had unprotected sex this month  Thinks she has BV Would like empiric treatment  There are no preventive care reminders to display for this patient.  Past Medical History:  Diagnosis Date   Abnormal Pap smear of cervix    2011?   Allergy    Anemia    Anxiety    Asthma    hospitalization 2008, last attack this month   Chronic nausea    Depression    Dysmenorrhea    Dyspareunia    Gestational diabetes 07/13/2016   no meds   Migraines    Mixed hyperlipidemia 02/23/2021   PID (pelvic inflammatory disease)    Wears glasses     Past Surgical History:  Procedure Laterality Date   CESAREAN SECTION     CESAREAN SECTION N/A 09/19/2016   Procedure: CESAREAN SECTION;  Surgeon: Tamiami Bing, MD;  Location: [facility];  Service: Obstetrics;  Laterality: N/A;   INCISE AND DRAIN ABCESS  2019   under arms " boils"   WISDOM TOOTH EXTRACTION      The following portions of the patient's history were reviewed and updated as appropriate: allergies, current medications, past family history, past medical history, past social history, past surgical history and problem list.   Health Maintenance:   Last pap: Lab Results  Component Value Date   DIAGPAP  01/06/2018    NEGATIVE FOR INTRAEPITHELIAL LESIONS OR MALIGNANCY.   DIAGPAP  01/06/2018    FUNGAL ORGANISMS PRESENT CONSISTENT WITH CANDIDA SPP.   Colleted today  Last mammogram:  N/a    Review of Systems:  Pertinent items noted in HPI and remainder of comprehensive ROS  otherwise negative.  Physical Exam:  BP (!) 167/128   Pulse 99   Ht 5\' 2"  (1.575 m)   Wt 223 lb 9.6 oz (101.4 kg)   LMP  (LMP Unknown)   Breastfeeding Unknown   BMI 40.90 kg/m  CONSTITUTIONAL: Well-developed, well-nourished female in no acute distress.  HEENT:  Normocephalic, atraumatic. External right and left ear normal. No scleral icterus.  NECK: Normal range of motion, supple, no masses noted on observation SKIN: No rash noted. Not diaphoretic. No erythema. No pallor. MUSCULOSKELETAL: Normal range of motion. No edema noted. NEUROLOGIC: Alert and oriented to person, place, and time. Normal muscle tone coordination.  PSYCHIATRIC: Normal mood and affect. Normal behavior. Normal judgment and thought content. RESPIRATORY: Effort normal, no problems with respiration noted ABDOMEN: No masses noted. No other overt distention noted.   PELVIC: Normal appearing external genitalia; normal appearing vaginal mucosa and cervix.  No abnormal discharge noted.  Normal uterine size, no other palpable masses, no uterine or adnexal tenderness.+whiff test  Labs and Imaging No results found for this or any previous visit (from the past 168 hour(s)). No results found.    Assessment and Plan:   Problem List Items Addressed This Visit   None Visit Diagnoses     Screen for sexually transmitted diseases    -  Primary   Relevant Orders  Cytology - PAP( [place])   RPR+HBsAg+HCVAb+...   Screening for cervical cancer       Relevant Orders   Cytology - PAP( [place])   Unprotected sexual intercourse       Relevant Orders   Beta hCG quant (ref lab)      #Contraception counseling #HTN Significant HTN on exam today. Has appt with PCP on Monday. Encouraged her to discuss treatment options with them and given she is planning pregnancy in the future to consider good options for that scenario such as labetalol or nifedipine. She is leaning towards OCP's for contraception though has used Nexplanon  in the past and liked the method. I discussed that given her severe HTN I would be very hesitant to use that specific method and would lean more towards nexplanon. She is still undecided but will let us know. Check hcg at patient request.   #STI screening Swabs and serologies collected  #BV +whiff test, flagyl rx sent.   #Cervical cancer screening Pap collected today  Routine preventative health maintenance measures emphasized. Please refer to After Visit Summary for other counseling recommendations.   Return if symptoms worsen or fail to improve.    Total face-to-face time with patient: 20 minutes.  Over 50% of encounter was spent on counseling and coordination of care.   Venora Maples, MD/MPH Attending Family Medicine Physician, [facility], [facility]

## 2022-05-14 ENCOUNTER — Ambulatory Visit: Payer: Medicaid Other | Admitting: Clinical

## 2022-05-14 DIAGNOSIS — F339 Major depressive disorder, recurrent, unspecified: Secondary | ICD-10-CM

## 2022-05-14 LAB — RPR+HBSAG+HCVAB+...
HIV Screen 4th Generation wRfx: NONREACTIVE
Hep C Virus Ab: NONREACTIVE
Hepatitis B Surface Ag: NEGATIVE
RPR Ser Ql: NONREACTIVE

## 2022-05-14 LAB — BETA HCG QUANT (REF LAB): hCG Quant: <1 m[IU]/mL

## 2022-05-14 NOTE — Patient Instructions (Signed)
[facility] [address] [place], [place] [address] [phone] (main office) [phone] (Dera Vanaken's office)  [facility]  [address], [place], [place] [address] [phone] or [phone] WALK-IN URGENT CARE 24/7 FOR ANYONE [address], [place], [place]  [phone] Fax: [phone] [email or web address] *Interpreters available *Accepts all insurance and uninsured for Urgent Care needs *Accepts Medicaid and uninsured for outpatient treatment (below)    ONLY FOR [place] Residents  Below:   Outpatient New Patient Assessment/Therapy Walk-ins:        Monday -Thursday 8am until slots are full.        Every Friday 1pm-4pm  (first come, first served)                   New Patient Psychiatry/Medication Management        Monday-Friday 8am-11am (first come, first served)              For all walk-ins we ask that you arrive by 7:15am, because patients will be seen in the order of arrival.     

## 2022-05-15 ENCOUNTER — Emergency Department (HOSPITAL_COMMUNITY)
Admission: EM | Admit: 2022-05-15 | Discharge: 2022-05-15 | Disposition: A | Discharge status: Home / Self Care | Payer: Medicaid Other | Attending: Emergency Medicine | Admitting: Emergency Medicine

## 2022-05-15 ENCOUNTER — Emergency Department (HOSPITAL_COMMUNITY): Payer: Medicaid Other

## 2022-05-15 ENCOUNTER — Encounter (HOSPITAL_COMMUNITY): Payer: Self-pay | Admitting: Emergency Medicine

## 2022-05-15 ENCOUNTER — Other Ambulatory Visit: Payer: Self-pay

## 2022-05-15 DIAGNOSIS — Z79899 Other long term (current) drug therapy: Secondary | ICD-10-CM | POA: Diagnosis not present

## 2022-05-15 DIAGNOSIS — M25511 Pain in right shoulder: Secondary | ICD-10-CM | POA: Diagnosis present

## 2022-05-15 DIAGNOSIS — I10 Essential (primary) hypertension: Secondary | ICD-10-CM

## 2022-05-15 DIAGNOSIS — G8911 Acute pain due to trauma: Secondary | ICD-10-CM | POA: Diagnosis not present

## 2022-05-15 DIAGNOSIS — X500XXA Overexertion from strenuous movement or load, initial encounter: Secondary | ICD-10-CM | POA: Diagnosis not present

## 2022-05-15 MED ORDER — LABETALOL HCL 100 MG PO TABS: 100.0000 mg | ORAL_TABLET | Freq: Two times a day (BID) | ORAL | 0 refills | Status: DC

## 2022-05-15 MED ORDER — IBUPROFEN 600 MG PO TABS: 600.0000 mg | ORAL_TABLET | Freq: Four times a day (QID) | ORAL | 0 refills | Status: AC | PRN

## 2022-05-15 MED ORDER — CYCLOBENZAPRINE HCL 10 MG PO TABS: 10.0000 mg | ORAL_TABLET | Freq: Two times a day (BID) | ORAL | 0 refills | Status: AC | PRN

## 2022-05-15 NOTE — Progress Notes (Signed)
Orthopedic Tech Progress Note Patient Details:  Vanessa Foster 1985-12-17 [id number]  Ortho Devices Type of Ortho Device: Shoulder immobilizer Ortho Device/Splint Location: Right arm Ortho Device/Splint Interventions: Application   Post Interventions Patient Tolerated: Well  Vanessa Foster 05/15/2022, 8:14 PM

## 2022-05-15 NOTE — ED Triage Notes (Signed)
Pt reports right shoulder pain x 1 week. States worsened since she has been working this week.

## 2022-05-15 NOTE — Discharge Instructions (Signed)
You have been evaluated for your shoulder pain.  X-ray did not show any concerning finding.  Your shoulder pain could be due to a rotator cuff injury or a nerve impingement or a muscle strain.  You may use sling as needed for symptom control but do not wear for more than a few hours at a time to decrease risk of frozen shoulder.  Your blood pressure is elevated today, please take blood pressure medication and have it rechecked by your doctor.  Return to the ER if any concern.  Otherwise follow-up with orthopedic for further care.

## 2022-05-15 NOTE — ED Provider Notes (Signed)
[facility]-EMERGENCY DEPT Provider Note   CSN: [id number] Arrival date & time: 05/15/22  1739     History  Chief Complaint  Patient presents with   Shoulder Pain    Vanessa Foster is a 36 y.o. female.  The history is provided by the patient and medical records. No language interpreter was used.  Shoulder Pain    36 year old female with significant history of anxiety, migraine, depression, gestational diabetes, hyperlipidemia presenting complaining of shoulder pain.  Patient report for the past week she has had pain about her right shoulder.  Pain is to the back of her shoulder blade, worsening with movement and worse when after she sleeps.  Pain occasionally radiates to her right arm with some tingling sensation.  Pain is mildly improved with Tylenol and ibuprofen at home.  She denies any significant fever neck pain chest pain trouble breathing.  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  She called out of work today.  Home Medications Prior to Admission medications   Medication Sig Start Date End Date Taking? Authorizing Provider  albuterol (VENTOLIN HFA) 108 (90 Base) MCG/ACT inhaler Inhale 2 puffs into the lungs every 6 (six) hours as needed for wheezing or shortness of breath. 01/09/21   Henson, Vickie L, NP-C  labetalol (NORMODYNE) 100 MG tablet Take 1 tablet by mouth 2 (two) times daily. Patient not taking: Reported on 05/13/2022 01/29/22   [provider]  metroNIDAZOLE (FLAGYL) 500 MG tablet Take 1 tablet (500 mg total) by mouth 2 (two) times daily for 7 days. 05/13/22 05/20/22  Venora Maples, MD  pantoprazole (PROTONIX) 40 MG tablet Take 1 tablet (40 mg total) by mouth daily for 14 days. Patient not taking: Reported on 01/28/2020 12/31/18 07/10/20  Cristina Gong, PA-C  sucralfate (CARAFATE) 1 g tablet Take 1 tablet (1 g total) by mouth 4 (four) times daily -  with meals and at bedtime for 14  days. Patient not taking: Reported on 01/28/2020 12/31/18 07/10/20  Cristina Gong, PA-C      Allergies    Patient has no known allergies.    Review of Systems   Review of Systems  All other systems reviewed and are negative.   Physical Exam Updated Vital Signs BP (!) 188/124 (BP Location: Left Arm)   Pulse 83   Temp 98.4 F (36.9 C) (Oral)   Resp 18   LMP  (LMP Unknown)   SpO2 100%  Physical Exam Vitals and nursing note reviewed.  Constitutional:      General: She is not in acute distress.    Appearance: She is well-developed.  HENT:     Head: Atraumatic.  Eyes:     Conjunctiva/sclera: Conjunctivae normal.  Pulmonary:     Effort: Pulmonary effort is normal.  Musculoskeletal:        General: Tenderness (Right shoulder: Tenderness to the posterior shoulder on palpation.  Normal passive range of motion without any deformity noted.  No significant cervical spine tenderness.  Right elbow and wrist nontender with normal grip strength and radial pulse 2+.) present.     Cervical back: Neck supple.  Skin:    Findings: No rash.  Neurological:     Mental Status: She is alert.  Psychiatric:        Mood and Affect: Mood normal.     ED Results / Procedures / Treatments   Labs (all labs ordered are listed, but only abnormal results are  displayed) Labs Reviewed - No data to display  EKG None  Radiology DG Shoulder Right Port  Result Date: 05/15/2022 CLINICAL DATA:  Pain x1 week EXAM: RIGHT SHOULDER - 1 VIEW COMPARISON:  None Available. FINDINGS: There is no evidence of fracture or dislocation. There is no evidence of arthropathy or other focal bone abnormality. Soft tissues are unremarkable. IMPRESSION: No radiographic abnormality seen in right shoulder. Electronically Signed   By: Elmer Picker M.D.   On: 05/15/2022 18:25    Procedures Procedures    Medications Ordered in ED Medications - No data to display  ED Course/ Medical Decision Making/ A&P                            Medical Decision Making Amount and/or Complexity of Data Reviewed Radiology: ordered.   BP (!) 188/124 (BP Location: Left Arm)   Pulse 83   Temp 98.4 F (36.9 C) (Oral)   Resp 18   LMP  (LMP Unknown)   SpO2 100%   8:01 PM This is a 36 year old female presenting complaining of ongoing right shoulder pain.  She endorsed pain to her right shoulder ongoing for the past week but worse since yesterday.  Pain worse with movement and when she wakes up.  She endorsed some tingling sensation down her right hand as well.  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  She does not endorse any chest pain or shortness of breath she does not complain of any headache or neck pain she does not complain of any arm weakness or dropping objects.  No history of DVT or PE.  On exam she does have tenderness about the right shoulder most likely to the posterior shoulder and the scapular region.  Increasing pain with active range of motion but able to move about passively.  She does not have any pain to her right elbow or wrist.  Radial pulse 2+, normal grip strength.  I suspect this is either a muscle skeletal strain versus rotator cuff injury versus nerve impingement.  Right shoulder x-ray obtained independently viewed and interpreted by me and I agree with radiologist interpretation.  X-ray unremarkable.  I will provide symptomatic treatment, and will provide a sling to use as needed.  Patient recommended to use sling sparingly to decrease risks of adhesive capsulitis.  Please note vital signs remarkable for elevated blood pressure of 188/124.  This is likely in the setting of pain.  I have low suspicion for cardiac etiology causing her symptoms.  Patient does have known history of elevated blood pressure and she is currently taking labetalol.  I encouraged patient to have a blood pressure recheck and return precaution given.  Orthopedic referral given as needed.  I  have reviewed patient's social determinant of health which includes depression and considered in my plan of care.  I have provided work note per request.        Final Clinical Impression(s) / ED Diagnoses Final diagnoses:  Acute pain of right shoulder  Hypertension, unspecified type    Rx / DC Orders ED Discharge Orders          Ordered    labetalol (NORMODYNE) 100 MG tablet  2 times daily        05/15/22 2007    ibuprofen (ADVIL) 600 MG tablet  Every 6 hours PRN        05/15/22 2007  cyclobenzaprine (FLEXERIL) 10 MG tablet  2 times daily PRN        05/15/22 2007              Fayrene Helper, Cordelia Poche 05/15/22 2008    Lorre Nick, MD 05/15/22 2108

## 2022-05-18 LAB — CYTOLOGY - PAP
Chlamydia: NEGATIVE
Comment: NEGATIVE
Comment: NEGATIVE
Comment: NEGATIVE
Comment: NORMAL
Diagnosis: NEGATIVE
High risk HPV: NEGATIVE
Neisseria Gonorrhea: NEGATIVE
Trichomonas: NEGATIVE

## 2022-05-18 NOTE — BH Specialist Note (Signed)
error 

## 2022-05-29 NOTE — BH Specialist Note (Signed)
Integrated Behavioral Health via Telemedicine Visit  06/04/2022 Vanessa Foster [id number]  Number of Integrated Behavioral Health Clinician visits: 3- Third Visit  Session Start time: 1524   Session End time: 1543  Total time in minutes: 19   Referring Provider: Merian Capron, MD Patient/Family location: Home [facility] Provider location: [facility]  All persons participating in visit: [name] and [facility] Vanessa Foster   Types of Service: Individual psychotherapy and Telephone visit  I connected with Vanessa Foster and/or Vanessa Foster's  n/a  via  Telephone or Video Enabled Telemedicine Application  (Video is Caregility application) and verified that I am speaking with the correct person using two identifiers. Discussed confidentiality: Yes   I discussed the limitations of telemedicine and the availability of in person appointments.  Discussed there is a possibility of technology failure and discussed alternative modes of communication if that failure occurs.  I discussed that engaging in this telemedicine visit, they consent to the provision of behavioral healthcare and the services will be billed under their insurance.  [name] expressed understanding and consented to Telemedicine visit: Yes   Presenting Concerns: Patient and/or family reports the following symptoms/concerns: Ongoing life stress; goal is to continue taking steps towards personal life goal. Duration of problem: Ongoing; Severity of problem: mild  Patient and/or Family's Strengths/Protective Factors: Social connections, Concrete supports in place (healthy food, safe environments, etc.), Sense of purpose, and Physical Health (exercise, healthy diet, medication compliance, etc.)  Goals Addressed: Patient will:  Maintaining reduction of symptoms of: anxiety, depression, and stress    Demonstrate ability  to: Increase healthy adjustment to current life circumstances  Progress towards Goals: Ongoing  Interventions: Interventions utilized:  Supportive Reflection Standardized Assessments completed: Not Needed  Patient and/or Family Response: Patient agrees with treatment plan.   Assessment: Patient currently experiencing Major depressive disorder, recurrent, moderate.   Patient may benefit from continued therapeutic interventions.  Plan: Follow up with behavioral health clinician on : Call Donnesha Karg at [phone], as needed. Behavioral recommendations:  -Continue attending both DV and Pregnancy Loss support groups -Continue plan to establish care with PCP -Continue plan to begin housing search with new voucher -Continue plan to enroll in [facility] for prerequisite nursing courses after finding and moving into new housing Referral(s): Integrated [employer] (In Clinic)  I discussed the assessment and treatment plan with the patient and/or parent/guardian. They were provided an opportunity to ask questions and all were answered. They agreed with the plan and demonstrated an understanding of the instructions.   They were advised to call back or seek an in-person evaluation if the symptoms worsen or if the condition fails to improve as anticipated.  Vanessa Foster Vanessa Chuba, LCSW     05/13/2022    3:52 PM 03/03/2022   10:46 AM 02/21/2021    2:58 PM 01/09/2021    3:00 PM 01/06/2018   11:53 AM  Depression screen PHQ 2/9  Decreased Interest 1 3 0 0 3  Down, Depressed, Hopeless 1 3 0 0 2  PHQ - 2 Score 2 6 0 0 5  Altered sleeping 2 3   3   Tired, decreased energy 2 3   3   Change in appetite 2 3   3   Feeling bad or failure about yourself  2 3   3   Trouble concentrating 2 3   2   Moving slowly or fidgety/restless 1 0   0  Suicidal thoughts 0  1   1  PHQ-9 Score 13 22   20   Difficult doing work/chores Not difficult at all          05/13/2022    3:52 PM 03/03/2022   10:47 AM 01/06/2018    11:53 AM 11/09/2016    8:45 AM  GAD 7 : Generalized Anxiety Score  Nervous, Anxious, on Edge 1 2 3 2   Control/stop worrying 2 3 3 2   Worry too much - different things 2 3 3 2   Trouble relaxing 2 3 3 1   Restless 2 2  2   Easily annoyed or irritable 2 3 2 1   Afraid - awful might happen 1 2 3  0  Total GAD 7 Score 12 18  10   Anxiety Difficulty Not difficult at all

## 2022-06-03 ENCOUNTER — Other Ambulatory Visit: Payer: Self-pay | Admitting: Nurse Practitioner

## 2022-06-03 DIAGNOSIS — M25511 Pain in right shoulder: Secondary | ICD-10-CM

## 2022-06-04 ENCOUNTER — Ambulatory Visit (INDEPENDENT_AMBULATORY_CARE_PROVIDER_SITE_OTHER): Payer: Medicaid Other | Admitting: Clinical

## 2022-06-04 DIAGNOSIS — F339 Major depressive disorder, recurrent, unspecified: Secondary | ICD-10-CM | POA: Diagnosis not present

## 2022-06-04 DIAGNOSIS — F4321 Adjustment disorder with depressed mood: Secondary | ICD-10-CM

## 2022-06-05 ENCOUNTER — Other Ambulatory Visit: Payer: Self-pay

## 2022-06-05 ENCOUNTER — Ambulatory Visit: Payer: Medicaid Other | Attending: Family Medicine

## 2022-06-05 DIAGNOSIS — M542 Cervicalgia: Secondary | ICD-10-CM | POA: Insufficient documentation

## 2022-06-05 DIAGNOSIS — M6281 Muscle weakness (generalized): Secondary | ICD-10-CM | POA: Diagnosis present

## 2022-06-05 DIAGNOSIS — M25511 Pain in right shoulder: Secondary | ICD-10-CM | POA: Insufficient documentation

## 2022-06-05 DIAGNOSIS — G8929 Other chronic pain: Secondary | ICD-10-CM | POA: Diagnosis present

## 2022-06-05 NOTE — Therapy (Signed)
OUTPATIENT PHYSICAL THERAPY CERVICAL EVALUATION   Patient Name: Vanessa Foster MRN: [id number] DOB:01/16/1986, 36 y.o., female Today's Date: 06/05/2022   PT End of Session - 06/05/22 1041     Visit Number 1    Number of Visits 9    Date for PT Re-Evaluation 08/07/22    Authorization Type [employer]    Authorization Time Period Pending auth    PT Start Time 1003    PT Stop Time 1045    PT Time Calculation (min) 42 min    Activity Tolerance Patient tolerated treatment well    Behavior During Therapy [facility] for tasks assessed/performed             Past Medical History:  Diagnosis Date   Abnormal Pap smear of cervix    2011?   Allergy    Anemia    Anxiety    Asthma    hospitalization 2008, last attack this month   Chronic nausea    Depression    Dysmenorrhea    Dyspareunia    Gestational diabetes 07/13/2016   no meds   Migraines    Mixed hyperlipidemia 02/23/2021   PID (pelvic inflammatory disease)    Wears glasses    Past Surgical History:  Procedure Laterality Date   CESAREAN SECTION     CESAREAN SECTION N/A 09/19/2016   Procedure: CESAREAN SECTION;  Surgeon: [place] Bing, MD;  Location: [facility];  Service: Obstetrics;  Laterality: N/A;   INCISE AND DRAIN ABCESS  2019   under arms " boils"   WISDOM TOOTH EXTRACTION     Patient Active Problem List   Diagnosis Date Noted   Miscarriage 03/03/2022   Mixed hyperlipidemia 02/23/2021   Morbid obesity (HCC) 02/21/2021   Prediabetes 02/21/2021   Recurrent boils 02/21/2021   Hidradenitis suppurativa of left axilla 02/21/2021   Pruritic intertrigo 02/21/2021   Abscess of right groin 02/21/2021   Yeast infection 01/06/2018   S/P primary low transverse C-section 10/26/2016   Normal postpartum course 10/26/2016   Gestational diabetes mellitus 09/19/2016   Anemia of mother in pregnancy, antepartum 07/29/2016   Daytime somnolence 07/11/2015   Sleep disorder 07/11/2015   Cephalalgia 07/11/2015    Mild intermittent asthma without complication 07/11/2015   Well woman exam with routine gynecological exam 07/11/2015   Depressed mood 07/11/2015   Chronic nausea 07/11/2015   Changing skin lesion 07/11/2015   Adjustment disorder with mixed anxiety and depressed mood 07/11/2015   Hemorrhoids 04/30/2014    PCP: No PCP  REFERRING PROVIDER: Otho Darner, MD  REFERRING DIAG: cervical strain and right rotator cuff syndrome  THERAPY DIAG:  Cervicalgia  Chronic right shoulder pain  Muscle weakness (generalized)  Rationale for Evaluation and Treatment Rehabilitation  ONSET DATE: 4 weeks ago  SUBJECTIVE:  SUBJECTIVE STATEMENT: Pt reports primary c/o Rt shoulder and cervical pain that began about 3-4 weeks ago. 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 Pt endorses N/T in her Rt posterior upper arm and lateral lower arm that can be associated with carrying objects in her Rt hand. Occasionally, this can extend to her Rt lateral 4 digits. Pt also endorses painful popping/ clicking in her Rt shoulder about 4-5x/day. Aggravating factors include carrying/ lifting objects, cooking. Easing factors include supporting the shoulder, ice, rest. Current pain is 6/10. Worst pain is 10/10. Best pain is 5/10.   PERTINENT HISTORY:  Asthma, Anxiety, Depression, migraines  PAIN:  Are you having pain? Yes: NPRS scale: 6/10 Pain location: Rt shoulder, neck, lateral upper arm Pain description: Achy, sharp, N/T Aggravating factors: carrying/ lifting objects, cooking Relieving factors: supporting the shoulder, ice, rest  PRECAUTIONS: None  WEIGHT BEARING RESTRICTIONS No  FALLS:  Has patient fallen in last 6 months? No  LIVING  ENVIRONMENT: Lives with: lives with their family Lives in: House/apartment Stairs: No Has following equipment at home: None  OCCUPATION: CNA  PLOF: Independent  PATIENT GOALS Return to working, lifting, cooking with less limitation  Screening for Suicide  Answer the following questions with Yes or No and place an "x" beside the action taken.  1. Over the past two weeks, have you felt down, depressed, or hopeless?   Yes  2. Within the past two weeks, have you felt little interest or pleasure in life?  Yes  If YES to either #1 or #2, then ask #3  3. Have you had thoughts that life is not worth living or that you might be       better off dead?   No  If answer is NO and suspicion is low, then end   4. Over this past week, have you had any thoughts about hurting or even killing yourself?    If NO, then end. Patient in no immediate danger   5. If so, do you believe that you intend to or will harm yourself?       If NO, then end. Patient in no immediate danger   6.  Do you have a plan as to how you would hurt yourself?     7.  Over this past week, have you actually done anything to hurt yourself?    IF YES answers to either #4, #5, #6 or #7, then patient is AT RISK for suicide   Actions Taken  ____  Screening negative; no further action required  __X__  Screening positive; no immediate danger and patient already in treatment with a  mental health provider. Advise patient to speak to their mental health provider.  ____  Screening positive; no immediate danger. Patient advised to contact a mental  health provider for further assessment.   ____  Screening positive; in immediate danger as patient states intention of killing self,  has plan and a sense of imminence. Do not leave alone. Seek permission from  patient to contact a family member to inform them. Direct patient to go to ED.   OBJECTIVE:   DIAGNOSTIC FINDINGS:  05/15/2022: DG Shoulder Right Port:  IMPRESSION: No radiographic abnormality seen in right shoulder.  PATIENT SURVEYS:  [name] 49/55   COGNITION: Overall cognitive status: Within functional limits for tasks assessed   SENSATION: Light touch: Not tested  POSTURE: rounded shoulders and forward head  PALPATION: TTP  to Rt supraspinatus muscle belly and tendinous junction   CERVICAL ROM:   AROM AROM (deg) eval  Flexion WNL  Extension WNL  Right lateral flexion WNL  Left lateral flexion WNL  Right rotation WNL  Left rotation WNL   (Blank rows = not tested)  UPPER EXTREMITY ROM:  AROM Right eval Left eval  Shoulder flexion 170, pain WNL  Shoulder abduction 160d, (+) painful arc and increased N/T in Rt fingers WNL  Shoulder internal rotation 60d, pain WNL  Shoulder external rotation 70d, pain WNL   (Blank rows = not tested)  UPPER EXTREMITY MMT:  MMT Right eval Left eval  Shoulder flexion 4/5p! 5/5  Shoulder abduction 3+/5p! 5/5  Shoulder internal rotation 4/5p! 5/5  Shoulder external rotation 3+/5 5/5  Middle trapezius 3+/5 4/5  Lower trapezius Not tested 3+/5  Latissimus dorsi 4/5 4+/5  Elbow flexion 5/5 5/5  Elbow extension 5/5 5/5  Wrist flexion 5/5 5/5  Wrist extension 5/5 5/5  Grip strength     (Blank rows = not tested)  SPECIAL TESTS:  Median nerve tension test: (-)  Radial nerve tension test: (-) Ulnar nerve tension test: (+) on Rt Hawkins-Kennedy: (+) Neer's: (+) O'Briens: (+) Cervical compression: (+) Cervical distraction: (+)   FUNCTIONAL TESTS:  DNF endurance testing: assess at future visit   TODAY'S TREATMENT:  06/05/2022: Demonstrated and issued HEP   PATIENT EDUCATION:  Education details: Pt educated on potential underlying pathophysiology, POC, QuickDASH, prognosis, and HEP Person educated: Patient Education method: Explanation, Demonstration, and Handouts Education comprehension: verbalized understanding and returned demonstration   HOME EXERCISE  PROGRAM: Access Code: [email or web address] URL: [email or web address] Date: 06/05/2022 Prepared by: Carmelina Dane  Exercises - Seated Cervical Retraction  - 1 x daily - 7 x weekly - 3 sets - 10 reps - Ulnar Nerve Flossing  - 1 x daily - 7 x weekly - 3 sets - 20 reps - Shoulder External Rotation and Scapular Retraction with Resistance  - 1 x daily - 7 x weekly - 3 sets - 10 reps - Standing Shoulder Row with Anchored Resistance  - 1 x daily - 7 x weekly - 3 sets - 10 reps - Shoulder extension with resistance - Neutral  - 1 x daily - 7 x weekly - 3 sets - 10 reps - Seated Upper Trapezius Stretch  - 1 x daily - 7 x weekly - 2 sets - 1 minute hold  ASSESSMENT:  CLINICAL IMPRESSION: Patient is a 36 y.o. F who was seen today for physical therapy evaluation and treatment for acute Rt shoulder pain and cervical pain with associated Rt UE N/T.  Upon assessment, her primary impairments include weak and painful global Rt shoulder MMT, painful and limited global Rt shoulder AROM,  hypomobile and painful Rt Ulnar nerve tension, weak global Rt>Lt parascapular MMT, and TTP to Rt supraspinatus muscle belly and tendinous junction. Ruling up Rt shoulder RTC tear due to positive Hawkins-Kennedy, positive Neer's, painful and limited Rt shoulder elevation and rotation AROM and MMT, and known MOI. Also ruling up Rt ulnar nerve radiculopathy due to positive Ulnar nerve tension testing, report of N/T to lateral arm, positive cervical compression and distraction. Pt will benefit from skilled PT to address her primary impairments and return to her prior level of function with less limitation.   OBJECTIVE IMPAIRMENTS decreased activity tolerance, decreased endurance, decreased mobility, decreased ROM, decreased strength, hypomobility, impaired flexibility, impaired sensation, impaired UE functional use, impaired vision/preception, improper body mechanics, postural dysfunction, and pain.  ACTIVITY LIMITATIONS  carrying, lifting, bending, sitting, standing, sleeping, dressing, and reach over head  PARTICIPATION LIMITATIONS: meal prep, cleaning, laundry, driving, shopping, community activity, occupation, and yard work  PERSONAL FACTORS 3+ comorbidities: See medical hx  are also affecting patient's functional outcome.   REHAB POTENTIAL: Good  CLINICAL DECISION MAKING: Evolving/moderate complexity  EVALUATION COMPLEXITY: Moderate   GOALS: Goals reviewed with patient? Yes  SHORT TERM GOALS: Target date: 07/03/2022   Pt will report understanding and adherence to initial HEP in order to promote independence in the management of primary impairments. Baseline: HEP provided at eval Goal status: INITIAL   LONG TERM GOALS: Target date: 07/31/2022  Pt will achieve a QuickDASH score of 30/55 or less in order to demonstrate improved functional ability as it relates to the pt's primary impairments. Baseline: 49/55 Goal status: INITIAL  2.  Pt will demonstrate ability to lift 10# into overhead cabinet using Rt arm in order to return to work duties with less limitation. Baseline: Pain with any lifting overhead Goal status: INITIAL  3.  Pt will achieve global Rt shoulder strength of 4+/5 or greater in order to improve her ability to cook with less limitation. Baseline: See MMT chart Goal status: INITIAL  4.  Pt will achieve Rt shoulder global AROM WNL with 0-4/10 pain in order to workout with less limitation. Baseline: See MMT chart, all painful motions >6/10 pain Goal status: INITIAL    PLAN: PT FREQUENCY: 1x/week  PT DURATION: 8 weeks  PLANNED INTERVENTIONS: Therapeutic exercises, Therapeutic activity, Neuromuscular re-education, Patient/Family education, Self Care, Joint mobilization, Aquatic Therapy, Dry Needling, Electrical stimulation, Spinal manipulation, Spinal mobilization, Cryotherapy, Moist heat, Taping, Vasopneumatic device, Biofeedback, Ionotophoresis 4mg /ml Dexamethasone, Manual  therapy, and Re-evaluation  PLAN FOR NEXT SESSION: Progress early RTC/ cervical strengthening, ROM exercises, manual techniques PRN, assess cervical passive accessories, DNF endurance strength   Check all possible CPT codes: - PT Re-evaluation, 97110- Therapeutic Exercise, 97026- Neuro Re-education, 97140 - Manual Therapy, 97530 - Therapeutic Activities, 97535 - Self Care, 97012 - Mechanical traction, 97014 - Electrical stimulation (unattended), [id number] - Electrical stimulation (Manual), [id number] - Iontophoresis, [id number] - Ultrasound, [id number] - Vaso, and [id number] - Aquatic therapy     If treatment provided at initial evaluation, no treatment charged due to lack of authorization.       [id number], PT, DPT 06/05/22 11:09 AM

## 2022-06-11 ENCOUNTER — Encounter: Payer: Self-pay | Admitting: Physical Therapy

## 2022-06-11 ENCOUNTER — Ambulatory Visit: Payer: Medicaid Other | Admitting: Physical Therapy

## 2022-06-11 DIAGNOSIS — M542 Cervicalgia: Secondary | ICD-10-CM

## 2022-06-11 DIAGNOSIS — G8929 Other chronic pain: Secondary | ICD-10-CM

## 2022-06-11 DIAGNOSIS — M6281 Muscle weakness (generalized): Secondary | ICD-10-CM

## 2022-06-11 NOTE — Therapy (Signed)
OUTPATIENT PHYSICAL THERAPY TREATMENT NOTE   Patient Name: Vanessa Foster MRN: [id number] DOB:[birth year], 36 y.o., female Today's Date: 06/11/2022  PCP: No PCP   REFERRING PROVIDER: Gentry Fitz, MD   PT End of Session - 06/11/22 1252     Visit Number 2    Number of Visits 9    Date for PT Re-Evaluation 08/07/22    Authorization Type [employer] M[employer]    Authorization Time Period Pending auth    PT Start Time 1215    PT Stop Time 1256    PT Time Calculation (min) 41 min    Activity Tolerance Patient tolerated treatment well    Behavior During Therapy [facility] for tasks assessed/performed             Past Medical History:  Diagnosis Date   Abnormal Pap smear of cervix    2011?   Allergy    Anemia    Anxiety    Asthma    hospitalization 2008, last attack this month   Chronic nausea    Depression    Dysmenorrhea    Dyspareunia    Gestational diabetes 07/13/2016   no meds   Migraines    Mixed hyperlipidemia 02/23/2021   PID (pelvic inflammatory disease)    Wears glasses    Past Surgical History:  Procedure Laterality Date   CESAREAN SECTION     CESAREAN SECTION N/A 09/19/2016   Procedure: CESAREAN SECTION;  Surgeon: Aletha Halim, MD;  Location: [place];  Service: Obstetrics;  Laterality: N/A;   INCISE AND DRAIN ABCESS  2019   under arms " boils"   [place] EXTRACTION     Patient Active Problem List   Diagnosis Date Noted   Miscarriage 03/03/2022   Mixed hyperlipidemia 02/23/2021   Morbid obesity ([place]) 02/21/2021   Prediabetes 02/21/2021   Recurrent boils 02/21/2021   Hidradenitis suppurativa of left axilla 02/21/2021   Pruritic intertrigo 02/21/2021   Abscess of right groin 02/21/2021   Yeast infection 01/06/2018   S/P primary low transverse C-section 10/26/2016   Normal postpartum course 10/26/2016   Gestational diabetes mellitus 09/19/2016   Anemia of mother in pregnancy, antepartum 07/29/2016   Daytime somnolence  07/11/2015   Sleep disorder 07/11/2015   Cephalalgia 07/11/2015   Mild intermittent asthma without complication 18/56/3149   Well woman exam with routine gynecological exam 07/11/2015   Depressed mood 07/11/2015   Chronic nausea 07/11/2015   Changing skin lesion 07/11/2015   Adjustment disorder with mixed anxiety and depressed mood 07/11/2015   Hemorrhoids 04/30/2014    THERAPY DIAG:  Cervicalgia  Chronic right shoulder pain  Muscle weakness (generalized)  REFERRING DIAG: cervical strain and right rotator cuff syndrome  PERTINENT HISTORY: Asthma, Anxiety, Depression, migraines  PRECAUTIONS/RESTRICTIONS:   none  SUBJECTIVE:  Pt reports that she is nervous about PT because she does not want her pain to increase.  She had some pain with her HEP.  PAIN:  Are you having pain? Yes: NPRS scale: 4/10 Pain location: Rt shoulder, neck, lateral upper arm Pain description: Achy, sharp, N/T Aggravating factors: carrying/ lifting objects, cooking Relieving factors: supporting the shoulder, ice, rest  OBJECTIVE: (objective measures completed at initial evaluation unless otherwise dated)  DIAGNOSTIC FINDINGS:  05/15/2022: DG Shoulder Right Port: IMPRESSION: No radiographic abnormality seen in right shoulder.   PATIENT SURVEYS:  Katina Dung 49/55     COGNITION: Overall cognitive status: Within functional limits for tasks assessed     SENSATION: Light touch: Not tested  POSTURE: rounded shoulders and forward head   PALPATION: TTP to Rt supraspinatus muscle belly and tendinous junction    CERVICAL ROM:    AROM AROM (deg) eval  Flexion WNL  Extension WNL  Right lateral flexion WNL  Left lateral flexion WNL  Right rotation WNL  Left rotation WNL   (Blank rows = not tested)   UPPER EXTREMITY ROM:   AROM Right eval Left eval  Shoulder flexion 170, pain WNL  Shoulder abduction 160d, (+) painful arc and increased N/T in Rt fingers WNL  Shoulder internal rotation  60d, pain WNL  Shoulder external rotation 70d, pain WNL   (Blank rows = not tested)   UPPER EXTREMITY MMT:   MMT Right eval Left eval  Shoulder flexion 4/5p! 5/5  Shoulder abduction 3+/5p! 5/5  Shoulder internal rotation 4/5p! 5/5  Shoulder external rotation 3+/5 5/5  Middle trapezius 3+/5 4/5  Lower trapezius Not tested 3+/5  Latissimus dorsi 4/5 4+/5  Elbow flexion 5/5 5/5  Elbow extension 5/5 5/5  Wrist flexion 5/5 5/5  Wrist extension 5/5 5/5  Grip strength       (Blank rows = not tested)   SPECIAL TESTS:  Median nerve tension test: (-)  Radial nerve tension test: (-) Ulnar nerve tension test: (+) on Rt Hawkins-Kennedy: (+) Neer's: (+) O'Briens: (+) Cervical compression: (+) Cervical distraction: (+)     FUNCTIONAL TESTS:  DNF endurance testing: assess at future visit     TODAY'S TREATMENT:  06/05/2022: Demonstrated and issued HEP     PATIENT EDUCATION:  Education details: Pt educated on potential underlying pathophysiology, POC, QuickDASH, prognosis, and HEP Person educated: Patient Education method: Explanation, Demonstration, and Handouts Education comprehension: verbalized understanding and returned demonstration     HOME EXERCISE PROGRAM: Access Code: [email or web address] URL: [email or web address] Date: 06/05/2022 Prepared by: Carmelina Dane   Exercises - Seated Cervical Retraction  - 1 x daily - 7 x weekly - 3 sets - 10 reps - Ulnar Nerve Flossing  - 1 x daily - 7 x weekly - 3 sets - 20 reps - Shoulder External Rotation and Scapular Retraction with Resistance  - 1 x daily - 7 x weekly - 3 sets - 10 reps - Standing Shoulder Row with Anchored Resistance  - 1 x daily - 7 x weekly - 3 sets - 10 reps - Shoulder extension with resistance - Neutral  - 1 x daily - 7 x weekly - 3 sets - 10 reps - Seated Upper Trapezius Stretch  - 1 x daily - 7 x weekly - 2 sets - 1 minute hold   TREATMENT 9/21:  Therapeutic Exercise: - UBE 2.5'/2.5' fwd and  backward for warm up while taking subjective - seated scapular retraction 3'' hold 2x10 w/ chin tuck - seated scapular depression 3'' hold 2x10 w/ chin tuck - shoulder rolls - 20x ea fwd/bkwd  - row - GTB - 3x20 - shoulder ext - RTB  - 3x20  Manual therapy: Skilled palpation to identify trigger points for TDN sub occipital release cervical traction STM R UT  Trigger Point Dry-Needling  Treatment instructions: Expect mild to moderate muscle soreness. S/S of pneumothorax if dry needled over a lung field, and to seek immediate medical attention should they occur. Patient verbalized understanding of these instructions and education.  Patient Consent Given: Yes Education handout provided: No Muscles treated: bil sub occipitals and R UT Electrical stimulation performed: No Parameters: N/A Treatment response/outcome: twitch    ASSESSMENT:   CLINICAL IMPRESSION: Vanessa Foster  tolerated session well with no adverse reaction.  We concentrated on gentle scapular and cervical mobility combined with light strengthening.  She is significantly limited by pain and fear of pain.  She does respond positively to cervical traction an TDN with significant pain reduction following.     OBJECTIVE IMPAIRMENTS decreased activity tolerance, decreased endurance, decreased mobility, decreased ROM, decreased strength, hypomobility, impaired flexibility, impaired sensation, impaired UE functional use, impaired vision/preception, improper body mechanics, postural dysfunction, and pain.    ACTIVITY LIMITATIONS carrying, lifting, bending, sitting, standing, sleeping, dressing, and reach over head   PARTICIPATION LIMITATIONS: meal prep, cleaning, laundry, driving, shopping, community activity, occupation, and yard work   PERSONAL FACTORS 3+ comorbidities: See medical hx  are also affecting patient's functional outcome.    REHAB POTENTIAL: Good   CLINICAL DECISION MAKING: Evolving/moderate complexity    EVALUATION COMPLEXITY: Moderate     GOALS: Goals reviewed with patient? Yes   SHORT TERM GOALS: Target date: 07/03/2022    Pt will report understanding and adherence to initial HEP in order to promote independence in the management of primary impairments. Baseline: HEP provided at eval Goal status: INITIAL     LONG TERM GOALS: Target date: 07/31/2022   Pt will achieve a QuickDASH score of 30/55 or less in order to demonstrate improved functional ability as it relates to the pt's primary impairments. Baseline: 49/55 Goal status: INITIAL   2.  Pt will demonstrate ability to lift 10# into overhead cabinet using Rt arm in order to return to work duties with less limitation. Baseline: Pain with any lifting overhead Goal status: INITIAL   3.  Pt will achieve global Rt shoulder strength of 4+/5 or greater in order to improve her ability to cook with less limitation. Baseline: See MMT chart Goal status: INITIAL   4.  Pt will achieve Rt shoulder global AROM WNL with 0-4/10 pain in order to workout with less limitation. Baseline: See MMT chart, all painful motions >6/10 pain Goal status: INITIAL       PLAN: PT FREQUENCY: 1x/week   PT DURATION: 8 weeks   PLANNED INTERVENTIONS: Therapeutic exercises, Therapeutic activity, Neuromuscular re-education, Patient/Family education, Self Care, Joint mobilization, Aquatic Therapy, Dry Needling, Electrical stimulation, Spinal manipulation, Spinal mobilization, Cryotherapy, Moist heat, Taping, Vasopneumatic device, Biofeedback, Ionotophoresis 4mg /ml Dexamethasone, Manual therapy, and Re-evaluation   PLAN FOR NEXT SESSION: Progress early RTC/ cervical strengthening, ROM exercises, manual techniques PRN, assess cervical passive accessories, DNF endurance strength   Wilbern Pennypacker PT 06/11/2022, 12:56 PM

## 2022-06-19 ENCOUNTER — Ambulatory Visit: Payer: Medicaid Other | Admitting: Physical Therapy

## 2022-06-19 ENCOUNTER — Encounter: Payer: Self-pay | Admitting: Physical Therapy

## 2022-06-19 DIAGNOSIS — G8929 Other chronic pain: Secondary | ICD-10-CM

## 2022-06-19 DIAGNOSIS — M6281 Muscle weakness (generalized): Secondary | ICD-10-CM

## 2022-06-19 DIAGNOSIS — M542 Cervicalgia: Secondary | ICD-10-CM | POA: Diagnosis not present

## 2022-06-19 NOTE — Therapy (Signed)
OUTPATIENT PHYSICAL THERAPY TREATMENT NOTE   Patient Name: Vanessa Foster MRN: [id number] DOB:Jun 07, 1986, 36 y.o., female Today's Date: 06/19/2022  PCP: No PCP   REFERRING PROVIDER: Gentry Fitz, MD   PT End of Session - 06/19/22 1128     Visit Number 3    Number of Visits 9    Date for PT Re-Evaluation 08/07/22    Authorization Type [employer]    Authorization Time Period Pending auth    PT Start Time 1130    PT Stop Time 1211    PT Time Calculation (min) 41 min    Activity Tolerance Patient tolerated treatment well    Behavior During Therapy [facility] for tasks assessed/performed             Past Medical History:  Diagnosis Date   Abnormal Pap smear of cervix    2011?   Allergy    Anemia    Anxiety    Asthma    hospitalization 2008, last attack this month   Chronic nausea    Depression    Dysmenorrhea    Dyspareunia    Gestational diabetes 07/13/2016   no meds   Migraines    Mixed hyperlipidemia 02/23/2021   PID (pelvic inflammatory disease)    Wears glasses    Past Surgical History:  Procedure Laterality Date   CESAREAN SECTION     CESAREAN SECTION N/A 09/19/2016   Procedure: CESAREAN SECTION;  Surgeon: Aletha Halim, MD;  Location: [place];  Service: Obstetrics;  Laterality: N/A;   INCISE AND DRAIN ABCESS  2019   under arms " boils"   [place] EXTRACTION     Patient Active Problem List   Diagnosis Date Noted   Miscarriage 03/03/2022   Mixed hyperlipidemia 02/23/2021   Morbid obesity ([place]) 02/21/2021   Prediabetes 02/21/2021   Recurrent boils 02/21/2021   Hidradenitis suppurativa of left axilla 02/21/2021   Pruritic intertrigo 02/21/2021   Abscess of right groin 02/21/2021   Yeast infection 01/06/2018   S/P primary low transverse C-section 10/26/2016   Normal postpartum course 10/26/2016   Gestational diabetes mellitus 09/19/2016   Anemia of mother in pregnancy, antepartum 07/29/2016   Daytime somnolence  07/11/2015   Sleep disorder 07/11/2015   Cephalalgia 07/11/2015   Mild intermittent asthma without complication 40/98/1191   Well woman exam with routine gynecological exam 07/11/2015   Depressed mood 07/11/2015   Chronic nausea 07/11/2015   Changing skin lesion 07/11/2015   Adjustment disorder with mixed anxiety and depressed mood 07/11/2015   Hemorrhoids 04/30/2014    THERAPY DIAG:  Cervicalgia  Chronic right shoulder pain  Muscle weakness (generalized)  REFERRING DIAG: cervical strain and right rotator cuff syndrome  PERTINENT HISTORY: Asthma, Anxiety, Depression, migraines  PRECAUTIONS/RESTRICTIONS:   none  SUBJECTIVE:  Pt reports some improvement in her sxs following MT and TDN.  She reports she had a flare in pain mid week, but this has now subsided.  PAIN:  Are you having pain? Yes: NPRS scale: 0/10, highest 10/10 Pain location: Rt shoulder, neck, lateral upper arm Pain description: Achy, sharp, N/T Aggravating factors: carrying/ lifting objects, cooking Relieving factors: supporting the shoulder, ice, rest  OBJECTIVE: (objective measures completed at initial evaluation unless otherwise dated)  DIAGNOSTIC FINDINGS:  05/15/2022: DG Shoulder Right Port: IMPRESSION: No radiographic abnormality seen in right shoulder.   PATIENT SURVEYS:  Katina Dung 49/55     COGNITION: Overall cognitive status: Within functional limits for tasks assessed     SENSATION: Light  touch: Not tested   POSTURE: rounded shoulders and forward head   PALPATION: TTP to Rt supraspinatus muscle belly and tendinous junction    CERVICAL ROM:    AROM AROM (deg) eval  Flexion WNL  Extension WNL  Right lateral flexion WNL  Left lateral flexion WNL  Right rotation WNL  Left rotation WNL   (Blank rows = not tested)   UPPER EXTREMITY ROM:   AROM Right eval Left eval  Shoulder flexion 170, pain WNL  Shoulder abduction 160d, (+) painful arc and increased N/T in Rt fingers WNL   Shoulder internal rotation 60d, pain WNL  Shoulder external rotation 70d, pain WNL   (Blank rows = not tested)   UPPER EXTREMITY MMT:   MMT Right eval Left eval  Shoulder flexion 4/5p! 5/5  Shoulder abduction 3+/5p! 5/5  Shoulder internal rotation 4/5p! 5/5  Shoulder external rotation 3+/5 5/5  Middle trapezius 3+/5 4/5  Lower trapezius Not tested 3+/5  Latissimus dorsi 4/5 4+/5  Elbow flexion 5/5 5/5  Elbow extension 5/5 5/5  Wrist flexion 5/5 5/5  Wrist extension 5/5 5/5  Grip strength       (Blank rows = not tested)   SPECIAL TESTS:  Median nerve tension test: (-)  Radial nerve tension test: (-) Ulnar nerve tension test: (+) on Rt Hawkins-Kennedy: (+) Neer's: (+) O'Briens: (+) Cervical compression: (+) Cervical distraction: (+)     FUNCTIONAL TESTS:  DNF endurance testing: assess at future visit     TODAY'S TREATMENT:  06/05/2022: Demonstrated and issued HEP     PATIENT EDUCATION:  Education details: Pt educated on potential underlying pathophysiology, POC, QuickDASH, prognosis, and HEP Person educated: Patient Education method: Explanation, Demonstration, and Handouts Education comprehension: verbalized understanding and returned demonstration     HOME EXERCISE PROGRAM: Access Code: [email or web address] URL: [email or web address] Date: 06/05/2022 Prepared by: Carmelina Dane   Exercises - Seated Cervical Retraction  - 1 x daily - 7 x weekly - 3 sets - 10 reps - Ulnar Nerve Flossing  - 1 x daily - 7 x weekly - 3 sets - 20 reps - Shoulder External Rotation and Scapular Retraction with Resistance  - 1 x daily - 7 x weekly - 3 sets - 10 reps - Standing Shoulder Row with Anchored Resistance  - 1 x daily - 7 x weekly - 3 sets - 10 reps - Shoulder extension with resistance - Neutral  - 1 x daily - 7 x weekly - 3 sets - 10 reps - Seated Upper Trapezius Stretch  - 1 x daily - 7 x weekly - 2 sets - 1 minute hold  TREATMENT 9/29:  Therapeutic  Exercise: - UBE 2.5'/2.5' fwd and backward for warm up while taking subjective - row - YTB - 3x10 - shoulder ext - YTB  - 3x10 - S/L ER - 3x10 - S/L abduction - 3x10  Manual therapy: Skilled palpation to identify trigger points for TDN sub occipital release cervical traction STM R UT  Trigger Point Dry-Needling  Treatment instructions: Expect mild to moderate muscle soreness. S/S of pneumothorax if dry needled over a lung field, and to seek immediate medical attention should they occur. Patient verbalized understanding of these instructions and education.  Patient Consent Given: Yes Education handout provided: No Muscles treated: bil sub occipitals and R UT Electrical stimulation performed: No Parameters: N/A Treatment response/outcome: twitch  TREATMENT 9/21:  Therapeutic Exercise: - UBE 2.5'/2.5' fwd and backward for warm up while taking subjective - seated scapular retraction 3''  hold 2x10 w/ chin tuck - seated scapular depression 3'' hold 2x10 w/ chin tuck - shoulder rolls - 20x ea fwd/bkwd  - row - GTB - 3x20 - shoulder ext - RTB  - 3x20   Manual therapy: Skilled palpation to identify trigger points for TDN sub occipital release cervical traction STM R UT  Trigger Point Dry-Needling  Treatment instructions: Expect mild to moderate muscle soreness. S/S of pneumothorax if dry needled over a lung field, and to seek immediate medical attention should they occur. Patient verbalized understanding of these instructions and education.  Patient Consent Given: Yes Education handout provided: No Muscles treated: bil sub occipitals and R UT Electrical stimulation performed: No Parameters: N/A Treatment response/outcome: twitch    ASSESSMENT:   CLINICAL IMPRESSION: Aunya tolerated session well with no adverse reaction.  She reports significant reduction in muscle tension following manual therapy and TDN.  We were able to add in SL ER for R/C strengthening with no  issue today.  We will continue with manual and periscapular and cervical strengthening as tolerated.     OBJECTIVE IMPAIRMENTS decreased activity tolerance, decreased endurance, decreased mobility, decreased ROM, decreased strength, hypomobility, impaired flexibility, impaired sensation, impaired UE functional use, impaired vision/preception, improper body mechanics, postural dysfunction, and pain.    ACTIVITY LIMITATIONS carrying, lifting, bending, sitting, standing, sleeping, dressing, and reach over head   PARTICIPATION LIMITATIONS: meal prep, cleaning, laundry, driving, shopping, community activity, occupation, and yard work   PERSONAL FACTORS 3+ comorbidities: See medical hx  are also affecting patient's functional outcome.    REHAB POTENTIAL: Good   CLINICAL DECISION MAKING: Evolving/moderate complexity   EVALUATION COMPLEXITY: Moderate     GOALS: Goals reviewed with patient? Yes   SHORT TERM GOALS: Target date: 07/03/2022    Pt will report understanding and adherence to initial HEP in order to promote independence in the management of primary impairments. Baseline: HEP provided at eval Goal status: INITIAL     LONG TERM GOALS: Target date: 07/31/2022   Pt will achieve a QuickDASH score of 30/55 or less in order to demonstrate improved functional ability as it relates to the pt's primary impairments. Baseline: 49/55 Goal status: INITIAL   2.  Pt will demonstrate ability to lift 10# into overhead cabinet using Rt arm in order to return to work duties with less limitation. Baseline: Pain with any lifting overhead Goal status: INITIAL   3.  Pt will achieve global Rt shoulder strength of 4+/5 or greater in order to improve her ability to cook with less limitation. Baseline: See MMT chart Goal status: INITIAL   4.  Pt will achieve Rt shoulder global AROM WNL with 0-4/10 pain in order to workout with less limitation. Baseline: See MMT chart, all painful motions >6/10  pain Goal status: INITIAL       PLAN: PT FREQUENCY: 1x/week   PT DURATION: 8 weeks   PLANNED INTERVENTIONS: Therapeutic exercises, Therapeutic activity, Neuromuscular re-education, Patient/Family education, Self Care, Joint mobilization, Aquatic Therapy, Dry Needling, Electrical stimulation, Spinal manipulation, Spinal mobilization, Cryotherapy, Moist heat, Taping, Vasopneumatic device, Biofeedback, Ionotophoresis 4mg /ml Dexamethasone, Manual therapy, and Re-evaluation   PLAN FOR NEXT SESSION: Progress early RTC/ cervical strengthening, ROM exercises, manual techniques PRN, assess cervical passive accessories, DNF endurance strength   Maziah Smola PT 06/19/2022, 12:11 PM

## 2022-06-20 ENCOUNTER — Ambulatory Visit
Admission: RE | Admit: 2022-06-20 | Discharge: 2022-06-20 | Disposition: A | Discharge status: Home / Self Care | Payer: Medicaid Other | Source: Ambulatory Visit | Attending: Nurse Practitioner | Admitting: Nurse Practitioner

## 2022-06-20 DIAGNOSIS — M25511 Pain in right shoulder: Secondary | ICD-10-CM

## 2022-06-20 MED ORDER — GADOPICLENOL 0.5 MMOL/ML IV SOLN
10.0000 mL | Freq: Once | INTRAVENOUS | Status: AC | PRN
  Administered 2022-06-20: 10 mL via INTRAVENOUS

## 2022-06-26 NOTE — Therapy (Signed)
OUTPATIENT PHYSICAL THERAPY TREATMENT NOTE   Patient Name: Vanessa Foster MRN: [id number] DOB:05/05/1986, 36 y.o., female Today's Date: 06/27/2022  PCP: No PCP   REFERRING PROVIDER: Otho Darner, MD   PT End of Session - 06/27/22 1027     Visit Number 4    Number of Visits 9    Date for PT Re-Evaluation 08/07/22    Authorization Type [employer]    Authorization Time Period 27 visits from 06/11/22-12/08/22    Authorization - Visit Number 3    Authorization - Number of Visits 27    PT Start Time 1030    PT Stop Time 1110    PT Time Calculation (min) 40 min    Activity Tolerance Patient tolerated treatment well    Behavior During Therapy [facility] for tasks assessed/performed              Past Medical History:  Diagnosis Date   Abnormal Pap smear of cervix    2011?   Allergy    Anemia    Anxiety    Asthma    hospitalization 2008, last attack this month   Chronic nausea    Depression    Dysmenorrhea    Dyspareunia    Gestational diabetes 07/13/2016   no meds   Migraines    Mixed hyperlipidemia 02/23/2021   PID (pelvic inflammatory disease)    Wears glasses    Past Surgical History:  Procedure Laterality Date   CESAREAN SECTION     CESAREAN SECTION N/A 09/19/2016   Procedure: CESAREAN SECTION;  Surgeon: Vanessa Bing, MD;  Location: [facility];  Service: Obstetrics;  Laterality: N/A;   INCISE AND DRAIN ABCESS  2019   under arms " boils"   WISDOM TOOTH EXTRACTION     Patient Active Problem List   Diagnosis Date Noted   Miscarriage 03/03/2022   Mixed hyperlipidemia 02/23/2021   Morbid obesity ([facility]) 02/21/2021   Prediabetes 02/21/2021   Recurrent boils 02/21/2021   Hidradenitis suppurativa of left axilla 02/21/2021   Pruritic intertrigo 02/21/2021   Abscess of right groin 02/21/2021   Yeast infection 01/06/2018   S/P primary low transverse C-section 10/26/2016   Normal postpartum course 10/26/2016   Gestational diabetes mellitus  09/19/2016   Anemia of mother in pregnancy, antepartum 07/29/2016   Daytime somnolence 07/11/2015   Sleep disorder 07/11/2015   Cephalalgia 07/11/2015   Mild intermittent asthma without complication 07/11/2015   Well woman exam with routine gynecological exam 07/11/2015   Depressed mood 07/11/2015   Chronic nausea 07/11/2015   Changing skin lesion 07/11/2015   Adjustment disorder with mixed anxiety and depressed mood 07/11/2015   Hemorrhoids 04/30/2014    THERAPY DIAG:  Cervicalgia  Chronic right shoulder pain  Muscle weakness (generalized)  REFERRING DIAG: cervical strain and right rotator cuff syndrome  PERTINENT HISTORY: Asthma, Anxiety, Depression, migraines  PRECAUTIONS/RESTRICTIONS:   none  SUBJECTIVE: Patient reports no current pain and that she had more pain at the beginning of the week.   PAIN:  Are you having pain? Yes: NPRS scale: 0/10, highest 10/10 Pain location: Rt shoulder, neck, lateral upper arm Pain description: Achy, sharp, N/T Aggravating factors: carrying/ lifting objects, cooking Relieving factors: supporting the shoulder, ice, rest  OBJECTIVE: (objective measures completed at initial evaluation unless otherwise dated)  DIAGNOSTIC FINDINGS:  05/15/2022: DG Shoulder Right Port: IMPRESSION: No radiographic abnormality seen in right shoulder.   PATIENT SURVEYS:  Vanessa Foster 49/55     COGNITION: Overall cognitive status: Within functional  limits for tasks assessed     SENSATION: Light touch: Not tested   POSTURE: rounded shoulders and forward head   PALPATION: TTP to Rt supraspinatus muscle belly and tendinous junction    CERVICAL ROM:    AROM AROM (deg) eval  Flexion WNL  Extension WNL  Right lateral flexion WNL  Left lateral flexion WNL  Right rotation WNL  Left rotation WNL   (Blank rows = not tested)   UPPER EXTREMITY ROM:   AROM Right eval Left eval  Shoulder flexion 170, pain WNL  Shoulder abduction 160d, (+) painful  arc and increased N/T in Rt fingers WNL  Shoulder internal rotation 60d, pain WNL  Shoulder external rotation 70d, pain WNL   (Blank rows = not tested)   UPPER EXTREMITY MMT:   MMT Right eval Left eval  Shoulder flexion 4/5p! 5/5  Shoulder abduction 3+/5p! 5/5  Shoulder internal rotation 4/5p! 5/5  Shoulder external rotation 3+/5 5/5  Middle trapezius 3+/5 4/5  Lower trapezius Not tested 3+/5  Latissimus dorsi 4/5 4+/5  Elbow flexion 5/5 5/5  Elbow extension 5/5 5/5  Wrist flexion 5/5 5/5  Wrist extension 5/5 5/5  Grip strength       (Blank rows = not tested)   SPECIAL TESTS:  Median nerve tension test: (-)  Radial nerve tension test: (-) Ulnar nerve tension test: (+) on Rt Hawkins-Kennedy: (+) Neer's: (+) O'Briens: (+) Cervical compression: (+) Cervical distraction: (+)     FUNCTIONAL TESTS:  DNF endurance testing: assess at future visit     TODAY'S TREATMENT:  06/05/2022: Demonstrated and issued HEP     PATIENT EDUCATION:  Education details: Pt educated on potential underlying pathophysiology, POC, QuickDASH, prognosis, and HEP Person educated: Patient Education method: Explanation, Demonstration, and Handouts Education comprehension: verbalized understanding and returned demonstration     HOME EXERCISE PROGRAM: Access Code: [email or web address] URL: [email or web address] Date: 06/05/2022 Prepared by: Vanessa Foster   Exercises - Seated Cervical Retraction  - 1 x daily - 7 x weekly - 3 sets - 10 reps - Ulnar Nerve Flossing  - 1 x daily - 7 x weekly - 3 sets - 20 reps - Shoulder External Rotation and Scapular Retraction with Resistance  - 1 x daily - 7 x weekly - 3 sets - 10 reps - Standing Shoulder Row with Anchored Resistance  - 1 x daily - 7 x weekly - 3 sets - 10 reps - Shoulder extension with resistance - Neutral  - 1 x daily - 7 x weekly - 3 sets - 10 reps - Seated Upper Trapezius Stretch  - 1 x daily - 7 x weekly - 2 sets - 1 minute  hold  TREATMENT 10/7: Therapeutic Exercise: - UBE 3/3 fwd and backward for warm up while taking subjective - row - YTB - 3x10 - shoulder ext - YTB  - 3x10 - S/L ER with 500g ball 2x10 - supine flexion with dowel (too painful) - supine chest press with dowel x15 - sidelying abduction 500g ball Rt 2x10 (to about 45)   TREATMENT 9/29:  Therapeutic Exercise: - UBE 2.5'/2.5' fwd and backward for warm up while taking subjective - row - YTB - 3x10 - shoulder ext - YTB  - 3x10 - S/L ER - 3x10 - S/L abduction - 3x10  Manual therapy: Skilled palpation to identify trigger points for TDN sub occipital release cervical traction STM R UT  Trigger Point Dry-Needling  Treatment instructions: Expect mild to moderate muscle soreness. S/S of pneumothorax  if dry needled over a lung field, and to seek immediate medical attention should they occur. Patient verbalized understanding of these instructions and education.  Patient Consent Given: Yes Education handout provided: No Muscles treated: bil sub occipitals and R UT Electrical stimulation performed: No Parameters: N/A Treatment response/outcome: twitch  TREATMENT 9/21:  Therapeutic Exercise: - UBE 2.5'/2.5' fwd and backward for warm up while taking subjective - seated scapular retraction 3'' hold 2x10 w/ chin tuck - seated scapular depression 3'' hold 2x10 w/ chin tuck - shoulder rolls - 20x ea fwd/bkwd  - row - GTB - 3x20 - shoulder ext - RTB  - 3x20   Manual therapy: Skilled palpation to identify trigger points for TDN sub occipital release cervical traction STM R UT  Trigger Point Dry-Needling  Treatment instructions: Expect mild to moderate muscle soreness. S/S of pneumothorax if dry needled over a lung field, and to seek immediate medical attention should they occur. Patient verbalized understanding of these instructions and education.  Patient Consent Given: Yes Education handout provided: No Muscles treated: bil sub  occipitals and R UT Electrical stimulation performed: No Parameters: N/A Treatment response/outcome: twitch    ASSESSMENT:   CLINICAL IMPRESSION: Patient presents to PT with no current pain and reports the TPDN has been helping. She reports HEP compliance and that the most difficult exercise is the ulnar nerve flossing. She had an MRI last week, but her MD has not gone over the results yet. Session today continued to focus on RTC and periscapular strengthening. She had an increase in pain with supine shoulder flexion, this exercise was terminated early. She reports 5/10 pain at end of session in anterior shoulder. Patient continues to benefit from skilled PT services and should be progressed as able to improve functional independence.    OBJECTIVE IMPAIRMENTS decreased activity tolerance, decreased endurance, decreased mobility, decreased ROM, decreased strength, hypomobility, impaired flexibility, impaired sensation, impaired UE functional use, impaired vision/preception, improper body mechanics, postural dysfunction, and pain.    ACTIVITY LIMITATIONS carrying, lifting, bending, sitting, standing, sleeping, dressing, and reach over head   PARTICIPATION LIMITATIONS: meal prep, cleaning, laundry, driving, shopping, community activity, occupation, and yard work   PERSONAL FACTORS 3+ comorbidities: See medical hx  are also affecting patient's functional outcome.    REHAB POTENTIAL: Good   CLINICAL DECISION MAKING: Evolving/moderate complexity   EVALUATION COMPLEXITY: Moderate     GOALS: Goals reviewed with patient? Yes   SHORT TERM GOALS: Target date: 07/03/2022    Pt will report understanding and adherence to initial HEP in order to promote independence in the management of primary impairments. Baseline: HEP provided at eval Goal status: INITIAL     LONG TERM GOALS: Target date: 07/31/2022   Pt will achieve a QuickDASH score of 30/55 or less in order to demonstrate improved  functional ability as it relates to the pt's primary impairments. Baseline: 49/55 Goal status: INITIAL   2.  Pt will demonstrate ability to lift 10# into overhead cabinet using Rt arm in order to return to work duties with less limitation. Baseline: Pain with any lifting overhead Goal status: INITIAL   3.  Pt will achieve global Rt shoulder strength of 4+/5 or greater in order to improve her ability to cook with less limitation. Baseline: See MMT chart Goal status: INITIAL   4.  Pt will achieve Rt shoulder global AROM WNL with 0-4/10 pain in order to workout with less limitation. Baseline: See MMT chart, all painful motions >6/10 pain Goal status: INITIAL  PLAN: PT FREQUENCY: 1x/week   PT DURATION: 8 weeks   PLANNED INTERVENTIONS: Therapeutic exercises, Therapeutic activity, Neuromuscular re-education, Patient/Family education, Self Care, Joint mobilization, Aquatic Therapy, Dry Needling, Electrical stimulation, Spinal manipulation, Spinal mobilization, Cryotherapy, Moist heat, Taping, Vasopneumatic device, Biofeedback, Ionotophoresis 4mg /ml Dexamethasone, Manual therapy, and Re-evaluation   PLAN FOR NEXT SESSION: Progress early RTC/ cervical strengthening, ROM exercises, manual techniques PRN, assess cervical passive accessories, DNF endurance strength   PTA 06/27/2022, 11:10 AM

## 2022-06-27 ENCOUNTER — Ambulatory Visit: Payer: Medicaid Other | Attending: Family Medicine

## 2022-06-27 DIAGNOSIS — M6281 Muscle weakness (generalized): Secondary | ICD-10-CM | POA: Insufficient documentation

## 2022-06-27 DIAGNOSIS — M542 Cervicalgia: Secondary | ICD-10-CM | POA: Diagnosis not present

## 2022-06-27 DIAGNOSIS — M25511 Pain in right shoulder: Secondary | ICD-10-CM | POA: Insufficient documentation

## 2022-06-27 DIAGNOSIS — G8929 Other chronic pain: Secondary | ICD-10-CM | POA: Diagnosis present

## 2022-06-29 ENCOUNTER — Encounter (HOSPITAL_COMMUNITY): Payer: Self-pay

## 2022-06-29 ENCOUNTER — Emergency Department (HOSPITAL_COMMUNITY)
Admission: EM | Admit: 2022-06-29 | Discharge: 2022-06-29 | Disposition: A | Discharge status: Home / Self Care | Payer: Medicaid Other | Attending: Emergency Medicine | Admitting: Emergency Medicine

## 2022-06-29 ENCOUNTER — Emergency Department (HOSPITAL_COMMUNITY): Payer: Medicaid Other

## 2022-06-29 ENCOUNTER — Other Ambulatory Visit: Payer: Self-pay

## 2022-06-29 DIAGNOSIS — I10 Essential (primary) hypertension: Secondary | ICD-10-CM | POA: Insufficient documentation

## 2022-06-29 DIAGNOSIS — Z79899 Other long term (current) drug therapy: Secondary | ICD-10-CM | POA: Diagnosis not present

## 2022-06-29 DIAGNOSIS — R42 Dizziness and giddiness: Secondary | ICD-10-CM | POA: Diagnosis present

## 2022-06-29 DIAGNOSIS — E876 Hypokalemia: Secondary | ICD-10-CM | POA: Insufficient documentation

## 2022-06-29 LAB — CBC WITH DIFFERENTIAL/PLATELET
Abs Immature Granulocytes: 0.03 10*3/uL (ref 0.00–0.07)
Basophils Absolute: 0 10*3/uL (ref 0.0–0.1)
Basophils Relative: 0 %
Eosinophils Absolute: 0.3 10*3/uL (ref 0.0–0.5)
Eosinophils Relative: 3 %
HCT: 39.7 % (ref 36.0–46.0)
Hemoglobin: 12.5 g/dL (ref 12.0–15.0)
Immature Granulocytes: 0 %
Lymphocytes Relative: 40 %
Lymphs Abs: 3.5 10*3/uL (ref 0.7–4.0)
MCH: 25.8 pg — ABNORMAL LOW (ref 26.0–34.0)
MCHC: 31.5 g/dL (ref 30.0–36.0)
MCV: 81.9 fL (ref 80.0–100.0)
Monocytes Absolute: 0.6 10*3/uL (ref 0.1–1.0)
Monocytes Relative: 6 %
Neutro Abs: 4.4 10*3/uL (ref 1.7–7.7)
Neutrophils Relative %: 51 %
Platelets: 306 10*3/uL (ref 150–400)
RBC: 4.85 MIL/uL (ref 3.87–5.11)
RDW: 15.6 % — ABNORMAL HIGH (ref 11.5–15.5)
WBC: 8.8 10*3/uL (ref 4.0–10.5)
nRBC: 0 % (ref 0.0–0.2)

## 2022-06-29 LAB — BASIC METABOLIC PANEL
Anion gap: 6 (ref 5–15)
BUN: 11 mg/dL (ref 6–20)
CO2: 24 mmol/L (ref 22–32)
Calcium: 9.3 mg/dL (ref 8.9–10.3)
Chloride: 106 mmol/L (ref 98–111)
Creatinine, Ser: 0.83 mg/dL (ref 0.44–1.00)
GFR, Estimated: >60 mL/min (ref >60)
Glucose, Bld: 94 mg/dL (ref 70–99)
Potassium: 3 mmol/L — ABNORMAL LOW (ref 3.5–5.1)
Sodium: 136 mmol/L (ref 135–145)

## 2022-06-29 MED ORDER — DIPHENHYDRAMINE HCL 50 MG/ML IJ SOLN
50.0000 mg | Freq: Once | INTRAMUSCULAR | Status: AC
  Administered 2022-06-29: 50 mg via INTRAVENOUS
  Filled 2022-06-29: qty 1

## 2022-06-29 MED ORDER — LABETALOL HCL 5 MG/ML IV SOLN
10.0000 mg | Freq: Once | INTRAVENOUS | Status: AC
  Administered 2022-06-29: 10 mg via INTRAVENOUS
  Filled 2022-06-29: qty 4

## 2022-06-29 MED ORDER — LABETALOL HCL 100 MG PO TABS: 100.0000 mg | ORAL_TABLET | Freq: Two times a day (BID) | ORAL | 0 refills | Status: DC

## 2022-06-29 MED ORDER — SODIUM CHLORIDE 0.9 % IV BOLUS
1000.0000 mL | Freq: Once | INTRAVENOUS | Status: AC
  Administered 2022-06-29: 1000 mL via INTRAVENOUS

## 2022-06-29 MED ORDER — LABETALOL HCL 100 MG PO TABS
100.0000 mg | ORAL_TABLET | Freq: Once | ORAL | Status: AC
  Administered 2022-06-29: 100 mg via ORAL
  Filled 2022-06-29: qty 1

## 2022-06-29 MED ORDER — METOCLOPRAMIDE HCL 5 MG/ML IJ SOLN
10.0000 mg | Freq: Once | INTRAMUSCULAR | Status: AC
  Administered 2022-06-29: 10 mg via INTRAVENOUS
  Filled 2022-06-29: qty 2

## 2022-06-29 NOTE — ED Triage Notes (Addendum)
Patient c/o hypertension, nausea, and dizziness x 3-4 days ago. BP in triage-188/123.  Patient added that she is also having a headache 10/10 and blurred vision.

## 2022-06-29 NOTE — ED Provider Notes (Signed)
[facility]-EMERGENCY DEPT Provider Note   CSN: [id number] Arrival date & time: 06/29/22  1835     History  Chief Complaint  Patient presents with   Hypertension   Nausea   Dizziness    Vanessa Foster is a 36 y.o. female.   Hypertension  Dizziness    Patient with medical history of hypertension on labetalol, frequent migraines/headaches on topamx presents today due to hypertension.  She is normally medically compliant with her labetalol 100 mg, states she ran out of her medicine about a week ago and her primary has not refilled it despite multiple calls at the pharmacy.  4 days ago she started having a headache which waxes and wanes throughout the day.  The headache is mostly to the posterior occiput, not lateralized.  It is worse in the morning, states that she is also having blurry vision x1 week which started after she changed her contacts.  No pain with eye movement, denies diplopia.  No lateralized weakness or numbness.  She has had nausea with 1 episode of vomiting yesterday as well as today secondary to the headache pain.  No medicine prior to arrival.  Denies chest pain or shortness of breath.  Home Medications Prior to Admission medications   Medication Sig Start Date End Date Taking? Authorizing Provider  albuterol (VENTOLIN HFA) 108 (90 Base) MCG/ACT inhaler Inhale 2 puffs into the lungs every 6 (six) hours as needed for wheezing or shortness of breath. 01/09/21  Yes Henson, Vickie L, NP-C  cyclobenzaprine (FLEXERIL) 10 MG tablet Take 1 tablet (10 mg total) by mouth 2 (two) times daily as needed for muscle spasms. 05/15/22  Yes Fayrene Helper, PA-C  ergocalciferol (VITAMIN D2) 1.25 MG (50000 UT) capsule Take 50,000 Units by mouth once a week.   Yes [provider]  ibuprofen (ADVIL) 600 MG tablet Take 1 tablet (600 mg total) by mouth every 6 (six) hours as needed. 05/15/22  Yes Fayrene Helper, PA-C  phentermine 30 MG capsule Take 30 mg by mouth  2 (two) times daily. 06/18/22  Yes [provider]  topiramate (TOPAMAX) 25 MG tablet Take 25 mg by mouth daily. 06/18/22  Yes [provider]  labetalol (NORMODYNE) 100 MG tablet Take 1 tablet (100 mg total) by mouth 2 (two) times daily. 06/29/22   Theron Arista, PA-C  pantoprazole (PROTONIX) 40 MG tablet Take 1 tablet (40 mg total) by mouth daily for 14 days. Patient not taking: Reported on 01/28/2020 12/31/18 07/10/20  Cristina Gong, PA-C  sucralfate (CARAFATE) 1 g tablet Take 1 tablet (1 g total) by mouth 4 (four) times daily -  with meals and at bedtime for 14 days. Patient not taking: Reported on 01/28/2020 12/31/18 07/10/20  Cristina Gong, PA-C      Allergies    Patient has no known allergies.    Review of Systems   Review of Systems  Neurological:  Positive for dizziness.    Physical Exam Updated Vital Signs BP (!) 178/129   Pulse 68   Temp 98.5 F (36.9 C) (Oral)   Resp 15   Ht 5\' 2"  (1.575 m)   Wt 100.2 kg   SpO2 98%   BMI 40.42 kg/m  Physical Exam Vitals and nursing note reviewed. Exam conducted with a chaperone present.  Constitutional:      Appearance: Normal appearance.  HENT:     Head: Normocephalic and atraumatic.     Comments: No temporal pain Eyes:  General: No scleral icterus.       Right eye: No discharge.        Left eye: No discharge.     Extraocular Movements: Extraocular movements intact.     Pupils: Pupils are equal, round, and reactive to light.  Neck:     Comments: No carotid bruits Cardiovascular:     Rate and Rhythm: Normal rate and regular rhythm.     Pulses: Normal pulses.     Heart sounds: Normal heart sounds. No murmur heard.    No friction rub. No gallop.  Pulmonary:     Effort: Pulmonary effort is normal. No respiratory distress.     Breath sounds: Normal breath sounds.  Abdominal:     General: Abdomen is flat. Bowel sounds are normal. There is no distension.     Palpations: Abdomen is soft.      Tenderness: There is no abdominal tenderness.  Musculoskeletal:     Cervical back: Normal range of motion. No tenderness.  Skin:    General: Skin is warm and dry.     Coloration: Skin is not jaundiced.  Neurological:     Mental Status: She is alert. Mental status is at baseline.     Coordination: Coordination normal.     Comments: Cranial nerves II through XII are grossly intact.  Upper and lower extremity strength is symmetric bilaterally, no pronator drift.  Dorsiflexion plantarflexion 5/5 against resistance.  Normal finger-nose.     ED Results / Procedures / Treatments   Labs (all labs ordered are listed, but only abnormal results are displayed) Labs Reviewed  BASIC METABOLIC PANEL - Abnormal; Notable for the following components:      Result Value   Potassium 3.0 (*)    All other components within normal limits  CBC WITH DIFFERENTIAL/PLATELET - Abnormal; Notable for the following components:   MCH 25.8 (*)    RDW 15.6 (*)    All other components within normal limits  HCG, QUANTITATIVE, PREGNANCY    EKG EKG Interpretation  Date/Time:  Monday June 29 2022 18:43:15 EDT Ventricular Rate:  90 PR Interval:  132 QRS Duration: 89 QT Interval:  353 QTC Calculation: 432 R Axis:   76 Text Interpretation: Sinus rhythm Borderline T abnormalities, anterior leads Confirmed by Kristine Royal [phone]) on 06/29/2022 7:48:56 PM  Radiology CT Head Wo Contrast  Result Date: 06/29/2022 CLINICAL DATA:  Severe headache, hypertension, nausea, dizziness for 3-4 days, blurred vision EXAM: CT HEAD WITHOUT CONTRAST TECHNIQUE: Contiguous axial images were obtained from the base of the skull through the vertex without intravenous contrast. RADIATION DOSE REDUCTION: This exam was performed according to the departmental dose-optimization program which includes automated exposure control, adjustment of the mA and/or kV according to patient size and/or use of iterative reconstruction technique.  COMPARISON:  None Available. FINDINGS: Brain: No evidence of acute infarction, hemorrhage, hydrocephalus, extra-axial collection or mass lesion/mass effect. Vascular: No hyperdense vessel or unexpected calcification. Skull: Normal. Negative for fracture or focal lesion. Sinuses/Orbits: No acute finding. Other: None. IMPRESSION: No acute intracranial pathology. Electronically Signed   By: Jearld Lesch M.D.   On: 06/29/2022 20:04    Procedures Procedures    Medications Ordered in ED Medications  sodium chloride 0.9 % bolus 1,000 mL (0 mLs Intravenous Stopped 06/29/22 2208)  metoCLOPramide (REGLAN) injection 10 mg (10 mg Intravenous Given 06/29/22 2006)  diphenhydrAMINE (BENADRYL) injection 50 mg (50 mg Intravenous Given 06/29/22 2006)  labetalol (NORMODYNE) injection 10 mg (10 mg Intravenous Given 06/29/22 2006)  labetalol (NORMODYNE) injection 10 mg (10 mg Intravenous Given 06/29/22 2133)  labetalol (NORMODYNE) tablet 100 mg (100 mg Oral Given 06/29/22 2208)    ED Course/ Medical Decision Making/ A&P                           Medical Decision Making Amount and/or Complexity of Data Reviewed Labs: ordered. Radiology: ordered.  Risk Prescription drug management.   Patient presents due to hypertension and headache.  Differential includes but not limited to hypertensive emergency, hypertensive urgency, AKI, SAH, aneurysm.  On exam patient is well-appearing.  She is hypertensive but there are no focal deficits.  No carotid bruits, mentating appropriately.  Upper and lower pulses 2+, there is no tenderness over the temporal artery.  I reviewed external medical records, patient has been on labetalol since her last pregnancy.  There is been an issue medical noncompliance.  I ordered, viewed and interpreted laboratory work-up.  BMP is without gross electrolyte derangement although slightly hypokalemic at 3.0.  CBC without leukocytosis or anemia.  I ordered, viewed and interpreted imaging.  CT  head is negative for acute process.  Agree with radiologist interpretation. EKG shows sinus rhythm, no LVH.    I ordered medications -labetalol, fluid bolus, Benadryl, Reglan.  No signs of AKI.  Patient not having any chest pains and EKG is normal so do not think any additional work-up there is indicated.  Consider CTA head and neck given headache is been going on for 3 to 4 days, the vision changes been going on for a week after contact change, normal neuro exam I do not think it is indicated.    I reevaluated patient after medicines she is feeling improved.  She is still hypertensive but it is improved compared to initial readings.  She is asymptomatic currently and there is no signs of endorgan damage so I do not think she needs admission hypertensive urgency.  I discussed with the patient possibly admitting her but she has kids at home and would prefer to follow-up with her PCP which I think is reasonable.  Return precautions were discussed with the patient, I also sent a refill of the labetalol.        Final Clinical Impression(s) / ED Diagnoses Final diagnoses:  Hypertension, unspecified type    Rx / DC Orders ED Discharge Orders          Ordered    labetalol (NORMODYNE) 100 MG tablet  2 times daily        06/29/22 2201              Sherrill Raring, PA-C 06/29/22 2341    Valarie Merino, MD 06/30/22 1451

## 2022-06-29 NOTE — Discharge Instructions (Addendum)
You are seen today in the emergency department for high blood pressure.  I sent a refill of the labetalol to your pharmacy, please take it as prescribed.  Call your primary tomorrow and be seen in the office later this week for reevaluation.  You may need to be on a stronger medicine than the labetalol and some changes may be indicated.  If you develop headache, vision changes, chest pain, shortness of breath or new symptoms you need to return to the ED for evaluation.

## 2022-07-04 ENCOUNTER — Ambulatory Visit: Payer: Medicaid Other

## 2022-07-04 DIAGNOSIS — M6281 Muscle weakness (generalized): Secondary | ICD-10-CM

## 2022-07-04 DIAGNOSIS — M542 Cervicalgia: Secondary | ICD-10-CM | POA: Diagnosis not present

## 2022-07-04 DIAGNOSIS — G8929 Other chronic pain: Secondary | ICD-10-CM

## 2022-08-04 ENCOUNTER — Ambulatory Visit (INDEPENDENT_AMBULATORY_CARE_PROVIDER_SITE_OTHER): Payer: Medicaid Other | Admitting: Clinical

## 2022-08-04 DIAGNOSIS — F330 Major depressive disorder, recurrent, mild: Secondary | ICD-10-CM
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
## 2022-08-11 ENCOUNTER — Encounter (HOSPITAL_COMMUNITY): Payer: Self-pay

## 2022-08-11 NOTE — Plan of Care (Signed)
  Problem: Depression CCP Problem  1  Goal: LTG: Vanessa "Rea" WILL SCORE LESS THAN 10 ON THE PATIENT HEALTH QUESTIONNAIRE (PHQ-9) Outcome: Initial Goal: STG: Vanessa "Rea" WILL IDENTIFY 3 COGNITIVE PATTERNS AND BELIEFS THAT SUPPORT DEPRESSION Outcome: Initial Goal: STG: Vanessa "Rea" WILL PRACTICE BEHAVIORAL ACTIVATION SKILLS 2 TIMES PER WEEK FOR THE NEXT 12 WEEKS Outcome: Initial

## 2022-09-08 ENCOUNTER — Ambulatory Visit (INDEPENDENT_AMBULATORY_CARE_PROVIDER_SITE_OTHER): Payer: Medicaid Other | Admitting: Clinical

## 2022-09-08 DIAGNOSIS — F331 Major depressive disorder, recurrent, moderate: Secondary | ICD-10-CM

## 2022-09-12 NOTE — Progress Notes (Signed)
THERAPIST PROGRESS NOTE Virtual Visit via Video Note  I connected with Vanessa Foster on 09/08/2022 at  3:00 PM EST by a video enabled telemedicine application and verified that I am speaking with the correct person using two identifiers.  Location: Patient: home Provider: office   I discussed the limitations of evaluation and management by telemedicine and the availability of in person appointments. The patient expressed understanding and agreed to proceed.   Follow Up Instructions: I discussed the assessment and treatment plan with the patient. The patient was provided an opportunity to ask questions and all were answered. The patient agreed with the plan and demonstrated an understanding of the instructions.   The patient was advised to call back or seek an in-person evaluation if the symptoms worsen or if the condition fails to improve as anticipated.   Session Time: 45 minutes  Participation Level: Active  Behavioral Response: CasualAlertDepressed  Type of Therapy: Individual Therapy  Treatment Goals addressed: client will practice behavioral activation skills 2 times per week for the next 12 weeks  ProgressTowards Goals: Progressing  Interventions: CBT and Supportive  Summary:  Vanessa Foster is a 36 y.o. female who presents for the scheduled appointment oriented times five, appropriately dressed, and friendly. Client denied hallucinations and delusions. Client reported she has been depressed. Client reported her due date just passed. Client reported she has been feeling disconnected from her children and herself. Client reported she has not been feeling like a good mother. Client reported this time last year she was closer to them. Client reported they use to all watch tv or cook together and now she can't bother to do that. Client reported on the anniversary date on December 12 she bought a pair of baby shoes and did not tell anyone. Client reported she  talks to her husband but she does not feel like he can support her because when she was pregnant he doubted the baby was his. Client reported sometime she does talk to her previous female partner about what she is going through. Client reported when she experienced depressive episodes before she had a hard time going back to work but now she has has been working since November and doing fine. Client reported she does smoke marijuana heavily and daily. Client reported she does not think she could function as well without it. Client reported she has been thinking about going to treatment because of it. Evidence of progress towards goal:  client reported she will start saying positive affirmations to herself at least 3x per week.   Suicidal/Homicidal: Nowithout intent/plan  Therapist Response:  Therapist began the appointment asking the client how she has been doing. Therapist used CBT to engage using active listening and positive emotional support. Therapist used CBT to ask the client open ended questions about severity of depressive symptoms compared to her daily functioning. Therapist used CBT to engage normalizing her symptoms of depression/ grief. Therapist used CBT to discuss behavioral activation. Therapist used CBT ask the client to identify her progress with frequency of use with coping skills with continued practice in her daily activity.    Therapist assigned the client homework to practice self care.    Plan: Return again in 3 weeks.  Diagnosis: major depressive disorder, recurrent episode, moderate with anxious distress  Collaboration of Care: Patient refused AEB none requested by the client.  Patient/Guardian was advised Release of Information must be obtained prior to any record release in order to collaborate their care with an  outside provider. Patient/Guardian was advised if they have not already done so to contact the registration department to sign all necessary forms in order  for [place] to release information regarding their care.   Consent: Patient/Guardian gives verbal consent for treatment and assignment of benefits for services provided during this visit. Patient/Guardian expressed understanding and agreed to proceed.   Neena Rhymes Serinity Ware, LCSW 09/08/2022

## 2022-09-18 ENCOUNTER — Other Ambulatory Visit: Payer: Self-pay

## 2022-09-18 ENCOUNTER — Encounter (HOSPITAL_COMMUNITY): Payer: Self-pay

## 2022-09-18 ENCOUNTER — Emergency Department (HOSPITAL_COMMUNITY)
Admission: EM | Admit: 2022-09-18 | Discharge: 2022-09-18 | Disposition: A | Discharge status: Home / Self Care | Payer: Medicaid Other | Attending: Emergency Medicine | Admitting: Emergency Medicine

## 2022-09-18 DIAGNOSIS — Z7952 Long term (current) use of systemic steroids: Secondary | ICD-10-CM | POA: Diagnosis not present

## 2022-09-18 DIAGNOSIS — Z7951 Long term (current) use of inhaled steroids: Secondary | ICD-10-CM | POA: Diagnosis not present

## 2022-09-18 DIAGNOSIS — R030 Elevated blood-pressure reading, without diagnosis of hypertension: Secondary | ICD-10-CM

## 2022-09-18 DIAGNOSIS — J4521 Mild intermittent asthma with (acute) exacerbation: Secondary | ICD-10-CM | POA: Insufficient documentation

## 2022-09-18 DIAGNOSIS — J45909 Unspecified asthma, uncomplicated: Secondary | ICD-10-CM | POA: Diagnosis present

## 2022-09-18 DIAGNOSIS — J069 Acute upper respiratory infection, unspecified: Secondary | ICD-10-CM | POA: Insufficient documentation

## 2022-09-18 DIAGNOSIS — I10 Essential (primary) hypertension: Secondary | ICD-10-CM | POA: Insufficient documentation

## 2022-09-18 DIAGNOSIS — Z20822 Contact with and (suspected) exposure to covid-19: Secondary | ICD-10-CM | POA: Insufficient documentation

## 2022-09-18 DIAGNOSIS — Z79899 Other long term (current) drug therapy: Secondary | ICD-10-CM | POA: Insufficient documentation

## 2022-09-18 DIAGNOSIS — J4520 Mild intermittent asthma, uncomplicated: Secondary | ICD-10-CM

## 2022-09-18 HISTORY — DX: Essential (primary) hypertension: I10

## 2022-09-18 LAB — RESP PANEL BY RT-PCR (RSV, FLU A&B, COVID)  RVPGX2
Influenza A by PCR: NEGATIVE
Influenza B by PCR: NEGATIVE
Resp Syncytial Virus by PCR: NEGATIVE
SARS Coronavirus 2 by RT PCR: NEGATIVE

## 2022-09-18 MED ORDER — ALBUTEROL SULFATE HFA 108 (90 BASE) MCG/ACT IN AERS: 2.0000 | INHALATION_SPRAY | Freq: Four times a day (QID) | RESPIRATORY_TRACT | 6 refills | Status: DC | PRN

## 2022-09-18 MED ORDER — PREDNISONE 20 MG PO TABS
60.0000 mg | ORAL_TABLET | Freq: Once | ORAL | Status: AC
  Administered 2022-09-18: 60 mg via ORAL
  Filled 2022-09-18: qty 3

## 2022-09-18 MED ORDER — PREDNISONE 10 MG PO TABS: 50.0000 mg | ORAL_TABLET | Freq: Every day | ORAL | 0 refills | Status: AC

## 2022-09-18 MED ORDER — IPRATROPIUM-ALBUTEROL 0.5-2.5 (3) MG/3ML IN SOLN
3.0000 mL | Freq: Once | RESPIRATORY_TRACT | Status: AC
  Administered 2022-09-18: 3 mL via RESPIRATORY_TRACT
  Filled 2022-09-18: qty 3

## 2022-09-18 NOTE — Discharge Instructions (Signed)
Prednisone as prescribed and complete the full course. Coricidin HBP as needed as directed for symptom relief (other cold medications can raise your blood pressure).  Recheck with your doctor. Return to ER for worsening or concerning symptoms.  Monitor your blood pressure, follow up with your PCP.  Your Covid/flu/rsv results will be available in your MyChart account later today.

## 2022-09-18 NOTE — ED Provider Triage Note (Signed)
Emergency Medicine Provider Triage Evaluation Note  Vanessa Foster , a 36 y.o. female  was evaluated in triage.  Pt complains of asthma attack, URI symptoms onset 3 days ago. No relief with inhaler.  Denies possibility of pregnancy  Admitted to hospital >10 years ago related to asthma, did not require intubation.  Review of Systems  Positive: Cough, wheezing, tight chest, runny nose, congestion Negative: Fevers, chills  Physical Exam  BP (!) 155/119 (BP Location: Right Arm)   Pulse 93   Temp 98.2 F (36.8 C) (Oral)   Resp 19   SpO2 98%  Gen:   Awake, no distress   Resp:  Normal effort  MSK:   Moves extremities without difficulty  Other:    Medical Decision Making  Medically screening exam initiated at 9:10 AM.  Appropriate orders placed.  Vanessa Foster was informed that the remainder of the evaluation will be completed by another provider, this initial triage assessment does not replace that evaluation, and the importance of remaining in the ED until their evaluation is complete.     Jeannie Fend, PA-C 09/18/22 [phone]

## 2022-09-18 NOTE — ED Provider Notes (Signed)
[facility] DEPT Provider Note   CSN: [id number] Arrival date & time: 09/18/22  0847     History  Chief Complaint  Patient presents with   Asthma    Vanessa Foster is a 36 y.o. female.  Pt complains of asthma attack, URI symptoms onset 3 days ago. No relief with inhaler.  Denies possibility of pregnancy  Admitted to hospital >10 years ago related to asthma, did not require intubation.         Home Medications Prior to Admission medications   Medication Sig Start Date End Date Taking? Authorizing Provider  predniSONE (DELTASONE) 10 MG tablet Take 5 tablets (50 mg total) by mouth daily for 5 days. 09/18/22 09/23/22 Yes Tacy Learn, PA-C  albuterol (VENTOLIN HFA) 108 (90 Base) MCG/ACT inhaler Inhale 2 puffs into the lungs every 6 (six) hours as needed for wheezing or shortness of breath. 09/18/22   Tacy Learn, PA-C  cyclobenzaprine (FLEXERIL) 10 MG tablet Take 1 tablet (10 mg total) by mouth 2 (two) times daily as needed for muscle spasms. 05/15/22   Domenic Moras, PA-C  ergocalciferol (VITAMIN D2) 1.25 MG (50000 UT) capsule Take 50,000 Units by mouth once a week.    [provider]  ibuprofen (ADVIL) 600 MG tablet Take 1 tablet (600 mg total) by mouth every 6 (six) hours as needed. 05/15/22   Domenic Moras, PA-C  labetalol (NORMODYNE) 100 MG tablet Take 1 tablet (100 mg total) by mouth 2 (two) times daily. 06/29/22   Sherrill Raring, PA-C  phentermine 30 MG capsule Take 30 mg by mouth 2 (two) times daily. 06/18/22   [provider]  topiramate (TOPAMAX) 25 MG tablet Take 25 mg by mouth daily. 06/18/22   [provider]  pantoprazole (PROTONIX) 40 MG tablet Take 1 tablet (40 mg total) by mouth daily for 14 days. Patient not taking: Reported on 01/28/2020 12/31/18 07/10/20  Lorin Glass, PA-C  sucralfate (CARAFATE) 1 g tablet Take 1 tablet (1 g total) by mouth 4 (four) times daily -  with meals and at bedtime for 14  days. Patient not taking: Reported on 01/28/2020 12/31/18 07/10/20  Lorin Glass, PA-C      Allergies    Patient has no known allergies.    Review of Systems   Review of Systems Negative except as per HPI Physical Exam Updated Vital Signs BP (!) 148/98   Pulse 90   Temp 98 F (36.7 C) (Oral)   Resp 19   SpO2 98%  Physical Exam Vitals and nursing note reviewed.  Constitutional:      General: She is not in acute distress.    Appearance: She is well-developed. She is not diaphoretic.  HENT:     Head: Normocephalic and atraumatic.     Right Ear: Tympanic membrane and ear canal normal.     Left Ear: Tympanic membrane and ear canal normal.     Nose: Nose normal.     Mouth/Throat:     Mouth: Mucous membranes are moist.  Eyes:     Conjunctiva/sclera: Conjunctivae normal.  Cardiovascular:     Rate and Rhythm: Normal rate and regular rhythm.     Heart sounds: Normal heart sounds.  Pulmonary:     Effort: Pulmonary effort is normal.     Breath sounds: Normal breath sounds.  Musculoskeletal:     Cervical back: Neck supple.  Skin:    General: Skin is warm and dry.     Findings:  No erythema or rash.  Neurological:     Mental Status: She is alert and oriented to person, place, and time.  Psychiatric:        Behavior: Behavior normal.     ED Results / Procedures / Treatments   Labs (all labs ordered are listed, but only abnormal results are displayed) Labs Reviewed  RESP PANEL BY RT-PCR (RSV, FLU A&B, COVID)  RVPGX2    EKG None  Radiology No results found.  Procedures Procedures    Medications Ordered in ED Medications  ipratropium-albuterol (DUONEB) 0.5-2.5 (3) MG/3ML nebulizer solution 3 mL (3 mLs Nebulization Given 09/18/22 0922)  predniSONE (DELTASONE) tablet 60 mg (60 mg Oral Given 09/18/22 0630)    ED Course/ Medical Decision Making/ A&P                           Medical Decision Making Risk Prescription drug management.   36 year old female  with past medical history of asthma presents with complaint of wheezing, cough, chest tightness following viral URI for the past 3 days.  On exam, she is well-appearing, nontoxic, no distress.  She feels like her albuterol inhaler is not helping her.  Patient is provided with prednisone and DuoNeb treatment.  Afterwards, she feels significantly improved.  She does have a history of hypertension, reports medication compliance.  Question of her blood pressure is elevated today secondary to cold medications.  She is advised to use Coricidin HBP only for her symptoms, continue with her regular home blood pressure medication, trend blood pressure recheck with PCP.  Provided with short course of prednisone and refill of her albuterol.        Final Clinical Impression(s) / ED Diagnoses Final diagnoses:  Mild intermittent asthma with exacerbation  Viral upper respiratory tract infection  Elevated blood pressure reading    Rx / DC Orders ED Discharge Orders          Ordered    predniSONE (DELTASONE) 10 MG tablet  Daily        09/18/22 1000    albuterol (VENTOLIN HFA) 108 (90 Base) MCG/ACT inhaler  Every 6 hours PRN        09/18/22 1000              Jeannie Fend, PA-C 09/18/22 1119    Alvira Monday, MD 09/18/22 2316

## 2022-09-18 NOTE — ED Triage Notes (Addendum)
Patient said she has had a cold for 3 days and it caused an asthma flare up. Used albuterol inhaler without relief. Has a productive cough but does not know what color she spits out.

## 2022-09-24 ENCOUNTER — Other Ambulatory Visit (HOSPITAL_COMMUNITY)
Admission: RE | Admit: 2022-09-24 | Discharge: 2022-09-24 | Disposition: A | Discharge status: Home / Self Care | Payer: Medicaid Other | Source: Ambulatory Visit | Attending: Family Medicine | Admitting: Family Medicine

## 2022-09-24 ENCOUNTER — Ambulatory Visit (INDEPENDENT_AMBULATORY_CARE_PROVIDER_SITE_OTHER): Payer: Medicaid Other | Admitting: *Deleted

## 2022-09-24 VITALS — BP 133/104 | HR 82 | Ht 62.0 in | Wt 196.4 lb

## 2022-09-24 DIAGNOSIS — Z202 Contact with and (suspected) exposure to infections with a predominantly sexual mode of transmission: Secondary | ICD-10-CM

## 2022-09-24 DIAGNOSIS — Z3200 Encounter for pregnancy test, result unknown: Secondary | ICD-10-CM

## 2022-09-24 LAB — POCT PREGNANCY, URINE: Preg Test, Ur: NEGATIVE

## 2022-09-24 NOTE — Progress Notes (Signed)
Here for pregnancy test and self swab. States had unprotected intercourse 5 days ago and wants to check for pregnancy and STD's . Pregnancy test negative. I explained she will need to wait at least 2 weeks or until missed period, then recheck pregnancy test. Self swab obtained.Explained she will get notified if positive and needs treatment. BP elevated x2. She states her PCP recently changed her bp meds about a month ago due to being too high but states didn't start medicine until about 2 weeks ago. She has PCP appt later this month. I advised she follow up with PCP and see if they want to see her sooner. Also discussed she checks  bp at home, we discussed warning symptoms and when to go to hospital. She voices understanding. Staci Acosta

## 2022-09-25 LAB — CERVICOVAGINAL ANCILLARY ONLY
Chlamydia: NEGATIVE
Comment: NEGATIVE
Comment: NEGATIVE
Comment: NORMAL
Neisseria Gonorrhea: NEGATIVE
Trichomonas: NEGATIVE

## 2022-10-01 ENCOUNTER — Ambulatory Visit (HOSPITAL_COMMUNITY): Payer: Medicaid Other | Admitting: Clinical

## 2022-10-15 ENCOUNTER — Encounter: Payer: Self-pay | Admitting: Certified Nurse Midwife

## 2022-10-15 ENCOUNTER — Ambulatory Visit (INDEPENDENT_AMBULATORY_CARE_PROVIDER_SITE_OTHER): Payer: Medicaid Other | Admitting: Certified Nurse Midwife

## 2022-10-15 VITALS — BP 127/91 | HR 86 | Wt 200.0 lb

## 2022-10-15 DIAGNOSIS — Z01419 Encounter for gynecological examination (general) (routine) without abnormal findings: Secondary | ICD-10-CM | POA: Diagnosis not present

## 2022-10-15 DIAGNOSIS — Z3042 Encounter for surveillance of injectable contraceptive: Secondary | ICD-10-CM

## 2022-10-15 DIAGNOSIS — Z3009 Encounter for other general counseling and advice on contraception: Secondary | ICD-10-CM

## 2022-10-15 LAB — POCT PREGNANCY, URINE: Preg Test, Ur: NEGATIVE

## 2022-10-15 MED ORDER — MEDROXYPROGESTERONE ACETATE 150 MG/ML IM SUSP
150.0000 mg | Freq: Once | INTRAMUSCULAR | Status: AC
  Administered 2022-10-15: 150 mg via INTRAMUSCULAR

## 2022-10-15 NOTE — Progress Notes (Signed)
ANNUAL EXAM Patient name: Vanessa Foster MRN [id number]  Date of birth: Nov 09, 1985 Chief Complaint:   No chief complaint on file.  History of Present Illness:   Vanessa Foster is a 37 y.o. G52P2001 African-American female being seen today for a routine annual exam.  Current complaints: No complaints, feels depression has eased considerably and has lost 50lbs since August with small dietary changes (portion control and stopped drinking sodas/juice).  Patient's last menstrual period was 09/28/2022 (approximate).   Upstream - 10/15/22 0916       Pregnancy Intention Screening   Does the patient want to become pregnant in the next year? No    Does the patient's partner want to become pregnant in the next year? No    Would the patient like to discuss contraceptive options today? Yes      Contraception Wrap Up   Current Method No Contraceptive Precautions    End Method Hormonal Injection    Contraception Counseling Provided Yes    How was the end contraceptive method provided? Provided on site            The pregnancy intention screening data noted above was reviewed. Potential methods of contraception were discussed. The patient elected to proceed with Hormonal Injection.   Last pap 05/13/22. Results were: NILM w/ HRHPV negative. H/O abnormal pap: no Last mammogram: Never (age). Results were: N/A. Family h/o breast cancer: no Last colonoscopy: Never (age). Results were: N/A. Family h/o colorectal cancer: no     10/15/2022    8:56 AM 08/04/2022    3:30 PM 05/13/2022    3:52 PM 03/03/2022   10:46 AM 02/21/2021    2:58 PM  Depression screen PHQ 2/9  Decreased Interest 1  1 3  0  Down, Depressed, Hopeless 1  1 3  0  PHQ - 2 Score 2  2 6  0  Altered sleeping 0  2 3   Tired, decreased energy 3  2 3    Change in appetite 1  2 3    Feeling bad or failure about yourself  1  2 3    Trouble concentrating 3  2 3    Moving slowly or fidgety/restless 0  1 0   Suicidal thoughts 0  0  1   PHQ-9 Score 10  13 22    Difficult doing work/chores   Not difficult at all       Information is confidential and restricted. Go to Review Flowsheets to unlock data.        10/15/2022    8:56 AM 08/04/2022    3:29 PM 05/13/2022    3:52 PM 03/03/2022   10:47 AM  GAD 7 : Generalized Anxiety Score  Nervous, Anxious, on Edge 2  1 2   Control/stop worrying 2  2 3   Worry too much - different things 2  2 3   Trouble relaxing 2  2 3   Restless 2  2 2   Easily annoyed or irritable 2  2 3   Afraid - awful might happen 2  1 2   Total GAD 7 Score 14  12 18   Anxiety Difficulty   Not difficult at all      Information is confidential and restricted. Go to Review Flowsheets to unlock data.     Review of Systems:   Pertinent items are noted in HPI Denies any headaches, blurred vision, fatigue, shortness of breath, chest pain, abdominal pain, abnormal vaginal discharge/itching/odor/irritation, problems with periods, bowel movements, urination, or intercourse unless otherwise stated above. Pertinent History Reviewed:  Reviewed past medical,surgical, social and family history.  Reviewed problem list, medications and allergies. Physical Assessment:   Vitals:   10/15/22 0845  BP: (!) 127/91  Pulse: 86  Weight: 200 lb (90.7 kg)   Body mass index is 36.58 kg/m.   Physical Examination:  General appearance - well appearing, and in no distress Mental status - alert, oriented to person, place, and time Psych:  She has a normal mood and affect Skin - warm and dry, normal color, no suspicious lesions noted Chest - effort normal Heart - normal rate and regular rhythm Neck:  midline trachea, no thyromegaly or nodules Breasts - breasts appear normal Abdomen - soft, nontender, nondistended, no masses or organomegaly Pelvic - exam deferred Extremities:  No swelling or varicosities noted  Chaperone present for exam  Results for orders placed or performed in visit on 10/15/22 (from the past 24  hour(s))  Pregnancy, urine POC   Collection Time: 10/15/22  8:53 AM  Result Value Ref Range   Preg Test, Ur NEGATIVE NEGATIVE    Assessment & Plan:      1. Women's annual routine gynecological examination - Praised her efforts at weight loss - Routine preventative health maintenance measures emphasized. - Mammogram: @ 35yo, or sooner if problems - Colonoscopy: @ 35yo, or sooner if problems  2. Birth control counseling - Reviewed MOC in stepwise fashion, she did not like the bleeding profile with Nexplanon, does not want an IUD and does not think she'll remember to take pills. Liked the timing of Depo but did not like that she bled for 4-52mo when she took it after her last delivery.  - Extended bleeding may have been due to timing, encouraged her to try it again and let me know if she has extended bleeding and we can do a round of OCPs to control. Pt amenable to plan. - medroxyPROGESTERone (DEPO-PROVERA) injection 150 mg  Orders Placed This Encounter  Procedures   Pregnancy, urine POC   Meds:  Meds ordered this encounter  Medications   medroxyPROGESTERone (DEPO-PROVERA) injection 150 mg   Follow-up: Return in about 3 months (around 01/14/2023) for DEPO.  Gabriel Carina, CNM 10/15/2022 9:16 AM

## 2022-11-30 ENCOUNTER — Telehealth (HOSPITAL_COMMUNITY): Payer: Self-pay | Admitting: Clinical

## 2022-11-30 ENCOUNTER — Ambulatory Visit (HOSPITAL_COMMUNITY): Payer: Medicaid Other | Admitting: Clinical

## 2022-11-30 ENCOUNTER — Encounter (HOSPITAL_COMMUNITY): Payer: Self-pay

## 2022-11-30 NOTE — Telephone Encounter (Signed)
Therapist sent the client a link for the scheduled virtual therapy visit as a text to her phone number. Client did not check in using the link. Therapist attempted telephone call to the client. Client did not respond. Therapist left a voicemail for the client to call the office to reschedule the appointment.

## 2022-12-20 ENCOUNTER — Encounter: Payer: Self-pay | Admitting: Certified Nurse Midwife

## 2022-12-25 ENCOUNTER — Encounter: Payer: Self-pay | Admitting: Certified Nurse Midwife

## 2022-12-26 DIAGNOSIS — E6609 Other obesity due to excess calories: Secondary | ICD-10-CM | POA: Diagnosis not present

## 2022-12-26 DIAGNOSIS — S0340XS Sprain of jaw, unspecified side, sequela: Secondary | ICD-10-CM | POA: Diagnosis not present

## 2022-12-26 DIAGNOSIS — R7303 Prediabetes: Secondary | ICD-10-CM | POA: Diagnosis not present

## 2022-12-26 DIAGNOSIS — R030 Elevated blood-pressure reading, without diagnosis of hypertension: Secondary | ICD-10-CM | POA: Diagnosis not present

## 2023-01-08 ENCOUNTER — Other Ambulatory Visit: Payer: Self-pay

## 2023-01-08 ENCOUNTER — Encounter: Payer: Self-pay | Admitting: Obstetrics & Gynecology

## 2023-01-08 ENCOUNTER — Ambulatory Visit (INDEPENDENT_AMBULATORY_CARE_PROVIDER_SITE_OTHER): Payer: Medicaid Other | Admitting: Obstetrics & Gynecology

## 2023-01-08 VITALS — BP 129/88 | HR 90

## 2023-01-08 DIAGNOSIS — Z9889 Other specified postprocedural states: Secondary | ICD-10-CM | POA: Diagnosis not present

## 2023-01-08 NOTE — Progress Notes (Signed)
GYNECOLOGY OFFICE VISIT NOTE  History:   Vanessa Foster is a 37 y.o. [id number] here today for follow up after TAB.  [facility], wanted birth control at her follow up visit but her UPT was still positive and they did not want to start it.  Of note, this pregnancy was conceived after receiving Depo Provera here; had negative UPT here but then a couple of weeks later was positive at home.  She does not want that scenario again, wants negative pregnancy test before starting birth control. Interested in [place], had this before without any issues. Reports that she had heavier bleeding after TAB, none currently, but worried about anemia.  No current symptoms. She denies any abnormal vaginal discharge, bleeding, pelvic pain or other concerns.    Past Medical History:  Diagnosis Date   Abnormal Pap smear of cervix    2011?   Allergy    Anemia    Anxiety    Asthma    hospitalization 2008, last attack this month   Chronic nausea    Depression    Dysmenorrhea    Dyspareunia    Gestational diabetes 07/13/2016   no meds   Hidradenitis suppurativa of left axilla 02/21/2021   Hypertension    Migraines    Miscarriage 03/03/2022   Mixed hyperlipidemia 02/23/2021   PID (pelvic inflammatory disease)    S/P primary low transverse C-section 10/26/2016   Wears glasses     Past Surgical History:  Procedure Laterality Date   CESAREAN SECTION     CESAREAN SECTION N/A 09/19/2016   Procedure: CESAREAN SECTION;  Surgeon: Burke Bing, MD;  [facility]: [facility] SU[facility];  Service: Obstetrics;  Laterality: N/A;   INCISE AND DRAIN ABCESS  2019   under arms " boils"   WISDOM TOOTH EXTRACTION      The following portions of the patient's history were reviewed and updated as appropriate: allergies, current medications, past family history, past medical history, past social history, past surgical history and problem list.   Health Maintenance:  Normal pap and negative  HRHPV on 05/13/2022.     Review of Systems:  Pertinent items noted in HPI and remainder of comprehensive ROS otherwise negative.  Physical Exam:  BP 129/88   Pulse 90   Breastfeeding No  CONSTITUTIONAL: Well-developed, well-nourished female in no acute distress.  HEENT:  Normocephalic, atraumatic. External right and left ear normal. No scleral icterus.  NECK: Normal range of motion, supple, no masses noted on observation SKIN: No rash noted. Not diaphoretic. No erythema. No pallor. MUSCULOSKELETAL: Normal range of motion. No edema noted. CARDIOVASCULAR: Normal heart rate noted RESPIRATORY: Effort and breath sounds normal, no problems with respiration noted ABDOMEN: No masses noted. No other overt distention noted.   PELVIC: Deferred    Assessment and Plan:    1. Status post therapeutic abortion Will check CBC and HCG, will follow up results and manage accordingly. If HCG is positive, patient is aware she may need to recheck levels to see trend, or many need other evaluation (such as ultrasound).  Will follow up results and manage accordingly. - CBC - Beta hCG quant (ref lab)  Routine preventative health maintenance measures emphasized. Please refer to After Visit Summary for other counseling recommendations.   Return for follow up as recommended.    I spent 25 minutes dedicated to the care of this patient including pre-visit review of records, face to face time with the patient discussing her conditions and  treatments and post visit orders.    Jaynie Collins, MD, FACOG Obstetrician & Gynecologist, [facility] for [employer], [facility]

## 2023-01-09 LAB — CBC
Hematocrit: 38 % (ref 34.0–46.6)
Hemoglobin: 11.7 g/dL (ref 11.1–15.9)
MCH: 26.7 pg (ref 26.6–33.0)
MCHC: 30.8 g/dL — ABNORMAL LOW (ref 31.5–35.7)
MCV: 87 fL (ref 79–97)
Platelets: 247 10*3/uL (ref 150–450)
RBC: 4.39 x10E6/uL (ref 3.77–5.28)
RDW: 14.1 % (ref 11.7–15.4)
WBC: 8.1 10*3/uL (ref 3.4–10.8)

## 2023-01-09 LAB — BETA HCG QUANT (REF LAB): hCG Quant: <1 m[IU]/mL

## 2023-01-11 NOTE — BH Specialist Note (Signed)
Integrated Behavioral Health via Telemedicine Visit  01/11/2023 Cresencia Asmus Turney [id number]  Number of Integrated Behavioral Health Clinician visits: 3- Third Visit  Session Start time: 1524   Session End time: 1543  Total time in minutes: 19   Referring Provider: *** Patient/Family location: [facility] Provider location: *** All persons participating in visit: *** Types of Service: {CHL AMB TYPE OF [phone]}  I connected with Ziyanna Tre Pickard and/or Mikala Tre Kiesler's {family members:20773} via  Telephone or Video Enabled Telemedicine Application  (Video is Caregility application) and verified that I am speaking with the correct person using two identifiers. Discussed confidentiality: {YES/NO:21197}  I discussed the limitations of telemedicine and the availability of in person appointments.  Discussed there is a possibility of technology failure and discussed alternative modes of communication if that failure occurs.  I discussed that engaging in this telemedicine visit, they consent to the provision of behavioral healthcare and the services will be billed under their insurance.  [name] expressed understanding and consented to Telemedicine visit: {YES/NO:21197}  Presenting Concerns: Patient and/or family reports the following symptoms/concerns: *** Duration of problem: ***; Severity of problem: {Mild/Moderate/Severe:20260}  Patient and/or Family's Strengths/Protective Factors: {CHL AMB BH PROTECTIVE [phone]}  Goals Addressed: Patient will:  Reduce symptoms of: {IBH Symptoms:21014056}   Increase knowledge and/or ability of: {IBH Patient Tools:21014057}   Demonstrate ability to: {IBH Goals:21014053}  Progress towards Goals: {CHL AMB BH PROGRESS TOWARDS GOALS:[phone]}  Interventions: Interventions utilized:  {IBH Interventions:21014054} Standardized Assessments completed: {IBH Screening Tools:21014051}  Patient  and/or Family Response: ***  Assessment: Patient currently experiencing ***.   Patient may benefit from ***.  Plan: Follow up with behavioral health clinician on : *** Behavioral recommendations: *** Referral(s): {IBH Referrals:21014055}  I discussed the assessment and treatment plan with the patient and/or parent/guardian. They were provided an opportunity to ask questions and all were answered. They agreed with the plan and demonstrated an understanding of the instructions.   They were advised to call back or seek an in-person evaluation if the symptoms worsen or if the condition fails to improve as anticipated.  Vanessa Foster Isamar Wellbrock, LCSW

## 2023-01-14 ENCOUNTER — Ambulatory Visit (INDEPENDENT_AMBULATORY_CARE_PROVIDER_SITE_OTHER): Payer: Medicaid Other | Admitting: Clinical

## 2023-01-14 ENCOUNTER — Other Ambulatory Visit: Payer: Self-pay

## 2023-01-14 ENCOUNTER — Ambulatory Visit: Payer: Medicaid Other

## 2023-01-14 DIAGNOSIS — F1290 Cannabis use, unspecified, uncomplicated: Secondary | ICD-10-CM

## 2023-01-14 DIAGNOSIS — F1090 Alcohol use, unspecified, uncomplicated: Secondary | ICD-10-CM | POA: Diagnosis not present

## 2023-01-14 NOTE — Patient Instructions (Addendum)
Center for [facility] at [facility] [address] [place], [place] [address] [phone] (main office) [phone] [facility] office)   Pediatric Therapy  [facility]                                                                                    [email or web address] [address] Gevena Cotton [place], [place], [place] [address]                                   Ph: [phone]                                          Fax: [phone]                       [email or web address]  [facility] [email or web address] [address] [place], [place] [address] Ph. [phone]                          Fax. [phone]  [facility] [email or web address] [address] [place], [place] [address]   Fax [phone]  Phone: [phone]  [facility] [address]  [place], [place] [address]  Ph: [phone] Fax: [phone] [email or web address]  [facility]                           [email or web address] [address] [place], [place], [place] [address]         Ph: [phone]                                         Fax: [phone]                                      Email:  [email or web address]  [facility]                    [email or web address] [address] [place], [place] [address]                                   Ph: [phone] / [phone] Fax: [phone] // For [facility] directly, phone is [phone] and fax is [phone]  [facility] [address]                              [place], [address]             [phone]  [facility]: Intake                 [phone]   [email or web address] .WCB  [facility]  [facility]al [facility]- [facility])                [email or web address]                            [address] [place], [place] [address]  Ph: [phone]                                         [facility]                    [email or web address] [place]: [address] [place], [place] [address]                                 Ph: [phone]; Fax: [phone]      [place]: [address] [place], [place] [address]                                                        Ph: [phone]; Fax: [phone]  [facility]                                               [email or web address] [place]: [address], [place], [place] [address]                                 [place]: [address] [place], [place] [address]                                                              Ph: [phone];  Fax: [phone]    Email: [email or web address]  [facility]                                [email or web address] [address] [place], [place] [address]                                                                Ph: [phone]; Fax: [phone]        [facility]/ Dionicia Abler, LPC, LCAS  [email or web address] [address] [place], [place] [address]        Ph: [phone]/ [phone]             Fax: [phone]  [facility]                           [email or web address] Address: [address] Mervyn Skeeters [place], [place] [address]                            Ph: [phone];  Fax: [phone]  [facility] Fax [phone] Address: [address], [place], [place] [address] Phone: [phone]  [facility] [address] [place], [place] [address]  [phone] P  [phone]  [facility]          [email or web address] [address] [place], [place] [address]                 Ph: [phone]                                          Fax: [phone]                                      [facility]                 [email or web address] [address] [place], [place] [address]  PH:[phone] Fax: [phone]  [facility]                                   [email or web address] [address] [place], [address]nd floor, [place], [place] [address]                                Ph: [phone]; fax: [phone]      Hours: M-Th 830a-8p; Fri 930a-7p  [facility]                                        [email or web address] [address] [place] [address], [place] [address]                       Ph: [phone]                       Fax: [phone]                             [facility]                                                [email or web address] [address], [place], [place] [address]      Main phone: [phone]  [facility] [address] Elaina Hoops [place], [place] [address] Fax: [phone] Phone: [phone]

## 2023-01-15 NOTE — BH Specialist Note (Deleted)
Integrated Behavioral Health Follow Up In-Person Visit  MRN: [id number] Name: Vanessa Foster  Number of Integrated Behavioral Health Clinician visits: 4- Fourth Visit  Session Start time: 1520   Session End time: 1604  Total time in minutes: 44   Types of Service: {CHL AMB TYPE OF [phone]}  Interpretor:No. Interpretor Name and Language: n/a  Subjective: Vanessa Foster is a 37 y.o. female accompanied by {Patient accompanied by:[phone]} Patient was referred by Merian Capron, MD for depression and grief. Patient reports the following symptoms/concerns: *** Duration of problem: ***; Severity of problem: {Mild/Moderate/Severe:20260}  Objective: Mood: {BHH MOOD:22306} and Affect: {BHH AFFECT:22307} Risk of harm to self or others: {CHL AMB BH Suicide Current Mental Status:21022748}  Life Context: Family and Social: *** School/Work: *** Self-Care: *** Life Changes: ***  Patient and/or Family's Strengths/Protective Factors: {CHL AMB BH PROTECTIVE FACTORS:[phone]}  Goals Addressed: Patient will:  Reduce symptoms of: {IBH Symptoms:21014056}   Increase knowledge and/or ability of: {IBH Patient Tools:21014057}   Demonstrate ability to: {IBH Goals:21014053}  Progress towards Goals: {CHL AMB BH PROGRESS TOWARDS GOALS:[phone]}  Interventions: Interventions utilized:  {IBH Interventions:21014054} Standardized Assessments completed: {IBH Screening Tools:21014051}  Patient and/or Family Response: Patient agrees with treatment plan.   Patient Centered Plan: Patient is on the following Treatment Plan(s): IBH Assessment: Patient currently experiencing ***.   Patient may benefit from psychoeducation and brief therapeutic interventions regarding coping with symptoms of *** .  Plan: Follow up with behavioral health clinician on : *** Behavioral recommendations:  -*** -*** Referral(s): {IBH Referrals:21014055}  Rae Lips,  LCSW     10/15/2022    8:56 AM 08/04/2022    3:30 PM 05/13/2022    3:52 PM 03/03/2022   10:46 AM 02/21/2021    2:58 PM  Depression screen PHQ 2/9  Decreased Interest 1  1 3  0  Down, Depressed, Hopeless 1  1 3  0  PHQ - 2 Score 2  2 6  0  Altered sleeping 0  2 3   Tired, decreased energy 3  2 3    Change in appetite 1  2 3    Feeling bad or failure about yourself  1  2 3    Trouble concentrating 3  2 3    Moving slowly or fidgety/restless 0  1 0   Suicidal thoughts 0  0 1   PHQ-9 Score 10  13 22    Difficult doing work/chores   Not difficult at all       Information is confidential and restricted. Go to Review Flowsheets to unlock data.      10/15/2022    8:56 AM 08/04/2022    3:29 PM 05/13/2022    3:52 PM 03/03/2022   10:47 AM  GAD 7 : Generalized Anxiety Score  Nervous, Anxious, on Edge 2  1 2   Control/stop worrying 2  2 3   Worry too much - different things 2  2 3   Trouble relaxing 2  2 3   Restless 2  2 2   Easily annoyed or irritable 2  2 3   Afraid - awful might happen 2  1 2   Total GAD 7 Score 14  12 18   Anxiety Difficulty   Not difficult at all      Information is confidential and restricted. Go to Review Flowsheets to unlock data.

## 2023-01-28 ENCOUNTER — Ambulatory Visit: Payer: Medicaid Other

## 2023-02-04 ENCOUNTER — Emergency Department (HOSPITAL_COMMUNITY)
Admission: EM | Admit: 2023-02-04 | Discharge: 2023-02-05 | Disposition: A | Discharge status: Home / Self Care | Payer: 59 | Attending: Emergency Medicine | Admitting: Emergency Medicine

## 2023-02-04 ENCOUNTER — Emergency Department (HOSPITAL_COMMUNITY): Payer: 59

## 2023-02-04 ENCOUNTER — Other Ambulatory Visit: Payer: Self-pay

## 2023-02-04 DIAGNOSIS — J4541 Moderate persistent asthma with (acute) exacerbation: Secondary | ICD-10-CM | POA: Insufficient documentation

## 2023-02-04 DIAGNOSIS — R079 Chest pain, unspecified: Secondary | ICD-10-CM | POA: Diagnosis not present

## 2023-02-04 DIAGNOSIS — Z20822 Contact with and (suspected) exposure to covid-19: Secondary | ICD-10-CM | POA: Diagnosis not present

## 2023-02-04 DIAGNOSIS — Z7952 Long term (current) use of systemic steroids: Secondary | ICD-10-CM | POA: Diagnosis not present

## 2023-02-04 DIAGNOSIS — Z79899 Other long term (current) drug therapy: Secondary | ICD-10-CM | POA: Insufficient documentation

## 2023-02-04 DIAGNOSIS — I10 Essential (primary) hypertension: Secondary | ICD-10-CM | POA: Insufficient documentation

## 2023-02-04 DIAGNOSIS — Z7951 Long term (current) use of inhaled steroids: Secondary | ICD-10-CM | POA: Insufficient documentation

## 2023-02-04 LAB — BASIC METABOLIC PANEL
Anion gap: 8 (ref 5–15)
BUN: 8 mg/dL (ref 6–20)
CO2: 25 mmol/L (ref 22–32)
Calcium: 9.4 mg/dL (ref 8.9–10.3)
Chloride: 105 mmol/L (ref 98–111)
Creatinine, Ser: 0.69 mg/dL (ref 0.44–1.00)
GFR, Estimated: >60 mL/min (ref >60)
Glucose, Bld: 91 mg/dL (ref 70–99)
Potassium: 3.6 mmol/L (ref 3.5–5.1)
Sodium: 138 mmol/L (ref 135–145)

## 2023-02-04 LAB — TROPONIN I (HIGH SENSITIVITY): Troponin I (High Sensitivity): <2 ng/L (ref <18)

## 2023-02-04 LAB — CBC
HCT: 38 % (ref 36.0–46.0)
Hemoglobin: 12.2 g/dL (ref 12.0–15.0)
MCH: 26.8 pg (ref 26.0–34.0)
MCHC: 32.1 g/dL (ref 30.0–36.0)
MCV: 83.5 fL (ref 80.0–100.0)
Platelets: 223 10*3/uL (ref 150–400)
RBC: 4.55 MIL/uL (ref 3.87–5.11)
RDW: 14.4 % (ref 11.5–15.5)
WBC: 8.6 10*3/uL (ref 4.0–10.5)
nRBC: 0 % (ref 0.0–0.2)

## 2023-02-04 LAB — RESP PANEL BY RT-PCR (RSV, FLU A&B, COVID)  RVPGX2
Influenza A by PCR: NEGATIVE
Influenza B by PCR: NEGATIVE
Resp Syncytial Virus by PCR: NEGATIVE
SARS Coronavirus 2 by RT PCR: NEGATIVE

## 2023-02-04 LAB — GROUP A STREP BY PCR: Group A Strep by PCR: NOT DETECTED

## 2023-02-04 LAB — HCG, QUANTITATIVE, PREGNANCY: hCG, Beta Chain, Quant, S: <1 m[IU]/mL (ref <5)

## 2023-02-04 MED ORDER — OLMESARTAN MEDOXOMIL-HCTZ 20-12.5 MG PO TABS: 1.0000 | ORAL_TABLET | Freq: Every day | ORAL | 3 refills | Status: AC

## 2023-02-04 MED ORDER — PREDNISONE 20 MG PO TABS: 40.0000 mg | ORAL_TABLET | Freq: Every day | ORAL | 0 refills | Status: DC

## 2023-02-04 MED ORDER — IPRATROPIUM BROMIDE 0.02 % IN SOLN
0.5000 mg | Freq: Once | RESPIRATORY_TRACT | Status: AC
  Administered 2023-02-04: 0.5 mg via RESPIRATORY_TRACT
  Filled 2023-02-04: qty 2.5

## 2023-02-04 MED ORDER — ALBUTEROL SULFATE (2.5 MG/3ML) 0.083% IN NEBU
5.0000 mg | INHALATION_SOLUTION | Freq: Once | RESPIRATORY_TRACT | Status: AC
  Administered 2023-02-04: 5 mg via RESPIRATORY_TRACT
  Filled 2023-02-04: qty 6

## 2023-02-04 MED ORDER — PREDNISONE 20 MG PO TABS
60.0000 mg | ORAL_TABLET | Freq: Once | ORAL | Status: AC
  Administered 2023-02-04: 60 mg via ORAL
  Filled 2023-02-04: qty 3

## 2023-02-04 NOTE — ED Triage Notes (Signed)
Pt arrives c/o SOB, CP, cough, HA, nausea, photosensitivity since last night. States pain is worse when she takes a deep breath or coughs. HA 10/10 pain. States that she's used inhaler several times with very temporary relief. Also c/o sore throat, nasal congestion, chills. States that her son was sick with similar symptoms about 2 weeks ago - he was diagnosed with strep and asthma at that time.

## 2023-02-04 NOTE — Discharge Instructions (Signed)
Today wasAll your blood work to left.  Your x-ray looked normal.  You were given a prescription for your blood pressure medication and steroids.  Continue to use your inhaler as needed.

## 2023-02-04 NOTE — ED Notes (Signed)
Pt was a difficult stick, initial trop was normal, physician okay with not collecting 2nd trop.

## 2023-02-04 NOTE — ED Provider Notes (Signed)
[place] EMERGENCY DEPARTMENT AT [facility] Provider Note   CSN: [id number] Arrival date & time: 02/04/23  1941     History  Chief Complaint  Patient presents with   Chest Pain   Shortness of Breath   Cough   Headache    Vanessa Foster is a 37 y.o. female.  Patient is a 37 year old female with a history of asthma and hypertension who is presenting today with complaints of worsening cough, chest tightness and shortness of breath.  This has been present for the last week but has been gradually getting worse and gotten much worse since last night.  She has occasional clear sputum and has occasionally feels chilled but has not had a fever.  She has tried her albuterol at home without any improvement.  She denies any abdominal pain, nausea or vomiting.  She does have a history of hypertension reports she last had her Benicar 1 month ago and did not follow-up to get a refill.  The history is provided by the patient.  Chest Pain Associated symptoms: cough, headache and shortness of breath   Shortness of Breath Associated symptoms: chest pain, cough and headaches   Cough Associated symptoms: chest pain, headaches and shortness of breath   Headache Associated symptoms: cough        Home Medications Prior to Admission medications   Medication Sig Start Date End Date Taking? Authorizing Provider  olmesartan-hydrochlorothiazide (BENICAR HCT) 20-12.5 MG tablet Take 1 tablet by mouth daily. 02/04/23  Yes Sharnae Winfree, Alphonzo Lemmings, MD  predniSONE (DELTASONE) 20 MG tablet Take 2 tablets (40 mg total) by mouth daily. 02/04/23  Yes Mikaele Stecher, Alphonzo Lemmings, MD  albuterol (VENTOLIN HFA) 108 (90 Base) MCG/ACT inhaler Inhale 2 puffs into the lungs every 6 (six) hours as needed for wheezing or shortness of breath. 09/18/22   Jeannie Fend, PA-C  cyclobenzaprine (FLEXERIL) 10 MG tablet Take 1 tablet (10 mg total) by mouth 2 (two) times daily as needed for muscle spasms. Patient not taking:  Reported on 01/08/2023 05/15/22   Fayrene Helper, PA-C  ergocalciferol (VITAMIN D2) 1.25 MG (50000 UT) capsule Take 50,000 Units by mouth once a week.    [provider]  ibuprofen (ADVIL) 600 MG tablet Take 1 tablet (600 mg total) by mouth every 6 (six) hours as needed. Patient not taking: Reported on 01/08/2023 05/15/22   Fayrene Helper, PA-C  Multiple Vitamin (MULTIVITAMINS PO) Take 1 tablet by mouth daily.    [provider]  pantoprazole (PROTONIX) 40 MG tablet Take 1 tablet (40 mg total) by mouth daily for 14 days. Patient not taking: Reported on 01/28/2020 12/31/18 07/10/20  Cristina Gong, PA-C  sucralfate (CARAFATE) 1 g tablet Take 1 tablet (1 g total) by mouth 4 (four) times daily -  with meals and at bedtime for 14 days. Patient not taking: Reported on 01/28/2020 12/31/18 07/10/20  Cristina Gong, PA-C      Allergies    Patient has no known allergies.    Review of Systems   Review of Systems  Respiratory:  Positive for cough and shortness of breath.   Cardiovascular:  Positive for chest pain.  Neurological:  Positive for headaches.    Physical Exam Updated Vital Signs BP (!) 166/100   Pulse 86   Temp 98.6 F (37 C) (Oral)   Resp 16   Ht 5\' 2"  (1.575 m)   Wt 87.1 kg   LMP  (LMP Unknown)   SpO2 99%   BMI 35.12  kg/m  Physical Exam Vitals and nursing note reviewed.  Constitutional:      General: She is not in acute distress.    Appearance: She is well-developed.  HENT:     Head: Normocephalic and atraumatic.  Eyes:     Pupils: Pupils are equal, round, and reactive to light.  Cardiovascular:     Rate and Rhythm: Normal rate and regular rhythm.     Heart sounds: Normal heart sounds. No murmur heard.    No friction rub.  Pulmonary:     Effort: Pulmonary effort is normal. Tachypnea present.     Breath sounds: Decreased breath sounds present. No wheezing or rales.  Abdominal:     General: Bowel sounds are normal. There is no distension.      Palpations: Abdomen is soft.     Tenderness: There is no abdominal tenderness. There is no guarding or rebound.  Musculoskeletal:        General: No tenderness. Normal range of motion.     Comments: No edema  Skin:    General: Skin is warm and dry.     Findings: No rash.  Neurological:     Mental Status: She is alert and oriented to person, place, and time.     Cranial Nerves: No cranial nerve deficit.  Psychiatric:        Behavior: Behavior normal.     ED Results / Procedures / Treatments   Labs (all labs ordered are listed, but only abnormal results are displayed) Labs Reviewed  RESP PANEL BY RT-PCR (RSV, FLU A&B, COVID)  [id number]  GROUP A STREP BY PCR  BASIC METABOLIC PANEL  CBC  HCG, QUANTITATIVE, PREGNANCY  TROPONIN I (HIGH SENSITIVITY)  TROPONIN I (HIGH SENSITIVITY)    EKG EKG Interpretation  Date/Time:  Thursday Feb 04 2023 19:55:25 EDT Ventricular Rate:  89 PR Interval:  136 QRS Duration: 86 QT Interval:  358 QTC Calculation: 436 R Axis:   64 Text Interpretation: Sinus rhythm No significant change since last tracing Confirmed by Gwyneth Sprout ([id number]) on 02/04/2023 9:04:41 PM  Radiology DG Chest Port 1 View  Result Date: 02/04/2023 CLINICAL DATA:  Chest pain EXAM: PORTABLE CHEST 1 VIEW COMPARISON:  10/31/2020 FINDINGS: The heart size and mediastinal contours are within normal limits. Both lungs are clear. The visualized skeletal structures are unremarkable. IMPRESSION: Normal study. Electronically Signed   By: Charlett Nose M.D.   On: 02/04/2023 20:29    Procedures Procedures    Medications Ordered in ED Medications  albuterol (PROVENTIL) (2.5 MG/3ML) 0.083% nebulizer solution 5 mg (5 mg Nebulization Given 02/04/23 2220)  ipratropium (ATROVENT) nebulizer solution 0.5 mg (0.5 mg Nebulization Given 02/04/23 2207)  predniSONE (DELTASONE) tablet 60 mg (60 mg Oral Given 02/04/23 2205)    ED Course/ Medical Decision Making/ A&P                              Medical Decision Making Amount and/or Complexity of Data Reviewed Labs: ordered. Decision-making details documented in ED Course. Radiology: ordered and independent interpretation performed. Decision-making details documented in ED Course. ECG/medicine tests: ordered and independent interpretation performed. Decision-making details documented in ED Course.  Risk Prescription drug management.   Pt with multiple medical problems and comorbidities and presenting today with a complaint that caries a high risk for morbidity and mortality.  Here today with worsening cough, chest tightness and shortness of breath.  Symptoms are most likely asthma exacerbation based  on her description.  Lower suspicion for ACS, pneumonia, PE.  Patient is PERC negative.  I independently interpreted patient's labs and EKG.  EKG with no acute findings today, BMP, CBC, troponin, Viral studies and strep and pregnancy test are all negative.  I have independently visualized and interpreted pt's images today.  Chest x-ray normal today.  Patient sats are 100% on room air but she does have some decreased breath sounds and has increased work of breathing.  Will give albuterol, Atrovent and prednisone and reevaluate.  11:26 PM Pt feeling better after treatment and ready for d/c.          Final Clinical Impression(s) / ED Diagnoses Final diagnoses:  Uncontrolled hypertension  Moderate persistent asthma with exacerbation    Rx / DC Orders ED Discharge Orders          Ordered    predniSONE (DELTASONE) 20 MG tablet  Daily        02/04/23 2316    olmesartan-hydrochlorothiazide (BENICAR HCT) 20-12.5 MG tablet  Daily        02/04/23 2316              Gwyneth Sprout, MD 02/04/23 2326

## 2023-02-08 ENCOUNTER — Encounter: Payer: Self-pay | Admitting: Obstetrics and Gynecology

## 2023-02-08 ENCOUNTER — Other Ambulatory Visit: Payer: Self-pay

## 2023-02-08 ENCOUNTER — Ambulatory Visit (INDEPENDENT_AMBULATORY_CARE_PROVIDER_SITE_OTHER): Payer: 59 | Admitting: Obstetrics and Gynecology

## 2023-02-08 VITALS — BP 135/89 | HR 99

## 2023-02-08 DIAGNOSIS — Z30017 Encounter for initial prescription of implantable subdermal contraceptive: Secondary | ICD-10-CM

## 2023-02-08 DIAGNOSIS — R519 Headache, unspecified: Secondary | ICD-10-CM

## 2023-02-08 LAB — POCT PREGNANCY, URINE: Preg Test, Ur: NEGATIVE

## 2023-02-08 MED ORDER — ETONOGESTREL 68 MG ~~LOC~~ IMPL
68.0000 mg | DRUG_IMPLANT | Freq: Once | SUBCUTANEOUS | Status: AC
  Administered 2023-02-08: 68 mg via SUBCUTANEOUS

## 2023-02-08 NOTE — Addendum Note (Signed)
Addended by: Brien Mates T on: 02/08/2023 05:01 PM   Modules accepted: Orders

## 2023-02-08 NOTE — Progress Notes (Addendum)
GYNECOLOGY VISIT  Patient name: Vanessa Foster MRN [id number]  Date of birth: [birth year] Chief Complaint:   Contraception  History:  Vanessa Foster is a 37 y.o. G3P2001 being seen today for nexplanon insertion. Has had nexplanon previously and would like to have it again. Has had unprotected intercourse in the last 4 days. Seen in ED 5/6 for HA and noted to have significantly elevated BP and started on anti-hypertensive. Taking ibuprofen for HA.    Past Medical History:  Diagnosis Date   Abnormal Pap smear of cervix    2011?   Allergy    Anemia    Anxiety    Asthma    hospitalization 2008, last attack this month   Chronic nausea    Depression    Dysmenorrhea    Dyspareunia    Gestational diabetes 07/13/2016   no meds   Hidradenitis suppurativa of left axilla 02/21/2021   Hypertension    Migraines    Miscarriage 03/03/2022   Mixed hyperlipidemia 02/23/2021   PID (pelvic inflammatory disease)    S/P primary low transverse C-section 10/26/2016   Wears glasses     Past Surgical History:  Procedure Laterality Date   CESAREAN SECTION     CESAREAN SECTION N/A 09/19/2016   Procedure: CESAREAN SECTION;  Surgeon:  Bing, MD;  Location: [facility];  Service: Obstetrics;  Laterality: N/A;   INCISE AND DRAIN ABCESS  2019   under arms " boils"   WISDOM TOOTH EXTRACTION      The following portions of the patient's history were reviewed and updated as appropriate: allergies, current medications, past family history, past medical history, past social history, past surgical history and problem list.   Health Maintenance:   Last pap     Component Value Date/Time   DIAGPAP  05/13/2022 1559    - Negative for intraepithelial lesion or malignancy (NILM)   DIAGPAP  01/06/2018 0000    NEGATIVE FOR INTRAEPITHELIAL LESIONS OR MALIGNANCY.   DIAGPAP  01/06/2018 0000    FUNGAL ORGANISMS PRESENT CONSISTENT WITH CANDIDA SPP.   HPVHIGH Negative 05/13/2022  1559   ADEQPAP  05/13/2022 1559    Satisfactory for evaluation; transformation zone component PRESENT.   ADEQPAP  01/06/2018 0000    Satisfactory for evaluation  endocervical/transformation zone component PRESENT.    High Risk HPV: Positive  Adequacy:  Satisfactory for evaluation, transformation zone component PRESENT  Diagnosis:  Atypical squamous cells of undetermined significance (ASC-US)    Review of Systems:  Pertinent items are noted in HPI. Comprehensive review of systems was otherwise negative.   Objective:  Physical Exam BP 135/89   Pulse 99   LMP  (LMP Unknown)    Physical Exam Vitals and nursing note reviewed.  Constitutional:      Appearance: Normal appearance.  HENT:     Head: Normocephalic and atraumatic.  Pulmonary:     Effort: Pulmonary effort is normal.  Skin:    General: Skin is warm and dry.  Neurological:     General: No focal deficit present.     Mental Status: She is alert.  Psychiatric:        Mood and Affect: Mood normal.        Behavior: Behavior normal.        Thought Content: Thought content normal.        Judgment: Judgment normal.      Nexplanon Insertion Procedure Patient identified, informed consent performed, consent signed.   Patient does understand that  irregular bleeding is a very common side effect of this medication. She was advised to have backup contraception for one week after placement. Pregnancy test in clinic today was negative.  Appropriate time out taken.  Patient's left arm was prepped and draped in the usual sterile fashion. The area of insertion was noted on the left arm.  Patient was prepped with alcohol swab and then injected with 3 ml of 1% lidocaine.  She was prepped with betadine, Nexplanon removed from packaging,  Device confirmed in needle, then inserted full length of needle and withdrawn per handbook instructions. Nexplanon was able to palpated in the patient's arm; patient  palpated the insert herself (nexplanon  ~3cm medial to insertion site due to skin laxity). There was minimal blood loss.  Patient insertion site covered with guaze and a pressure bandage to reduce any bruising.  The patient tolerated the procedure well and was given post procedure instructions.    Assessment & Plan:   1. Nexplanon insertion Now s/p uncomplicated nexplanon insertion. Discussed possible bleeding pattern changes and recommendation for use of back up contraception x 1 week. Noted to take another UPT in about 2-3 weeks given unprotected intercourse w/in the last 1 week; negative UPT today and 4 days ago.   2. Intractable episodic headache, unspecified headache type Advised trying APAP for HA and continue to monitor BP   Lorriane Shire, MD Minimally In[facility]nec[facility], [facility]

## 2023-03-05 ENCOUNTER — Emergency Department (HOSPITAL_COMMUNITY): Payer: 59

## 2023-03-05 ENCOUNTER — Emergency Department (HOSPITAL_COMMUNITY)
Admission: EM | Admit: 2023-03-05 | Discharge: 2023-03-05 | Disposition: A | Discharge status: Home / Self Care | Payer: 59 | Attending: Emergency Medicine | Admitting: Emergency Medicine

## 2023-03-05 ENCOUNTER — Encounter (HOSPITAL_COMMUNITY): Payer: Self-pay | Admitting: *Deleted

## 2023-03-05 ENCOUNTER — Other Ambulatory Visit: Payer: Self-pay

## 2023-03-05 DIAGNOSIS — K921 Melena: Secondary | ICD-10-CM | POA: Insufficient documentation

## 2023-03-05 DIAGNOSIS — R197 Diarrhea, unspecified: Secondary | ICD-10-CM

## 2023-03-05 DIAGNOSIS — D72829 Elevated white blood cell count, unspecified: Secondary | ICD-10-CM | POA: Diagnosis not present

## 2023-03-05 LAB — CBC
HCT: 42 % (ref 36.0–46.0)
Hemoglobin: 13.1 g/dL (ref 12.0–15.0)
MCH: 25.6 pg — ABNORMAL LOW (ref 26.0–34.0)
MCHC: 31.2 g/dL (ref 30.0–36.0)
MCV: 82.2 fL (ref 80.0–100.0)
Platelets: 240 10*3/uL (ref 150–400)
RBC: 5.11 MIL/uL (ref 3.87–5.11)
RDW: 14.8 % (ref 11.5–15.5)
WBC: 10.7 10*3/uL — ABNORMAL HIGH (ref 4.0–10.5)
nRBC: 0 % (ref 0.0–0.2)

## 2023-03-05 LAB — I-STAT BETA HCG BLOOD, ED (MC, WL, AP ONLY): I-stat hCG, quantitative: <5 m[IU]/mL (ref <5)

## 2023-03-05 LAB — LIPASE, BLOOD: Lipase: 36 U/L (ref 11–51)

## 2023-03-05 LAB — URINALYSIS, ROUTINE W REFLEX MICROSCOPIC
Bilirubin Urine: NEGATIVE
Glucose, UA: NEGATIVE mg/dL
Ketones, ur: NEGATIVE mg/dL
Leukocytes,Ua: NEGATIVE
Nitrite: NEGATIVE
Protein, ur: 100 mg/dL — AB
Specific Gravity, Urine: 1.026 (ref 1.005–1.030)
pH: 5 (ref 5.0–8.0)

## 2023-03-05 LAB — COMPREHENSIVE METABOLIC PANEL
ALT: 16 U/L (ref 0–44)
AST: 18 U/L (ref 15–41)
Albumin: 4.3 g/dL (ref 3.5–5.0)
Alkaline Phosphatase: 48 U/L (ref 38–126)
Anion gap: 9 (ref 5–15)
BUN: 11 mg/dL (ref 6–20)
CO2: 22 mmol/L (ref 22–32)
Calcium: 9.4 mg/dL (ref 8.9–10.3)
Chloride: 106 mmol/L (ref 98–111)
Creatinine, Ser: 0.97 mg/dL (ref 0.44–1.00)
GFR, Estimated: >60 mL/min (ref >60)
Glucose, Bld: 99 mg/dL (ref 70–99)
Potassium: 3.4 mmol/L — ABNORMAL LOW (ref 3.5–5.1)
Sodium: 137 mmol/L (ref 135–145)
Total Bilirubin: 0.9 mg/dL (ref 0.3–1.2)
Total Protein: 8.5 g/dL — ABNORMAL HIGH (ref 6.5–8.1)

## 2023-03-05 LAB — PROTIME-INR
INR: 1.1 (ref 0.8–1.2)
Prothrombin Time: 14.4 seconds (ref 11.4–15.2)

## 2023-03-05 LAB — POC OCCULT BLOOD, ED: Fecal Occult Bld: POSITIVE — AB

## 2023-03-05 LAB — TYPE AND SCREEN
ABO/RH(D): O POS
Antibody Screen: NEGATIVE

## 2023-03-05 LAB — APTT: aPTT: 27 seconds (ref 24–36)

## 2023-03-05 MED ORDER — SODIUM CHLORIDE (PF) 0.9 % IJ SOLN
INTRAMUSCULAR | Status: AC
  Filled 2023-03-05: qty 50

## 2023-03-05 MED ORDER — SODIUM CHLORIDE 0.9 % IV BOLUS
1000.0000 mL | Freq: Once | INTRAVENOUS | Status: AC
  Administered 2023-03-05: 1000 mL via INTRAVENOUS

## 2023-03-05 MED ORDER — ONDANSETRON 8 MG PO TBDP: 8.0000 mg | ORAL_TABLET | Freq: Three times a day (TID) | ORAL | 0 refills | Status: AC | PRN

## 2023-03-05 MED ORDER — IOHEXOL 300 MG/ML  SOLN
100.0000 mL | Freq: Once | INTRAMUSCULAR | Status: AC | PRN
  Administered 2023-03-05: 100 mL via INTRAVENOUS

## 2023-03-05 NOTE — ED Triage Notes (Signed)
Pt states she has been having loose stools for 3-4 weeks, recently noted blood in the the stool. Some nausea without emesis.

## 2023-03-05 NOTE — Discharge Instructions (Addendum)
Take the Zofran as needed for nausea.  You can take over-the-counter medications such as Imodium for diarrhea.  Return to the emergency room for heavy bleeding or other concerning symptoms.  Follow-up with the GI doctor as we discussed for further evaluation of the blood in your stool

## 2023-03-05 NOTE — ED Provider Notes (Signed)
[place] EMERGENCY DEPARTMENT AT [facility] Provider Note   CSN: [id number] Arrival date & time: 03/05/23  0865     History  Chief Complaint  Patient presents with   Blood In Stools   Diarrhea    Vanessa Foster is a 37 y.o. female.   Diarrhea    Patient presents to the ED for evaluation of abdominal pain blood in her stool and diarrhea.  Patient denies any history of an intestinal issues.  Patient states for the last few weeks she has been having intermittent loose stools.  In the last day or so she started to notice blood in her stool.  She is also noticed just some blood.  She has not had any vomiting.  The pain in her abdomen is in the lower abdomen and is cramping.  She does not have any prior history of intestinal bleeding  Home Medications Prior to Admission medications   Medication Sig Start Date End Date Taking? Authorizing Provider  ondansetron (ZOFRAN-ODT) 8 MG disintegrating tablet Take 1 tablet (8 mg total) by mouth every 8 (eight) hours as needed for nausea or vomiting. 03/05/23  Yes Linwood Dibbles, MD  albuterol (VENTOLIN HFA) 108 (90 Base) MCG/ACT inhaler Inhale 2 puffs into the lungs every 6 (six) hours as needed for wheezing or shortness of breath. 09/18/22   Jeannie Fend, PA-C  cyclobenzaprine (FLEXERIL) 10 MG tablet Take 1 tablet (10 mg total) by mouth 2 (two) times daily as needed for muscle spasms. Patient not taking: Reported on 01/08/2023 05/15/22   Fayrene Helper, PA-C  ergocalciferol (VITAMIN D2) 1.25 MG (50000 UT) capsule Take 50,000 Units by mouth once a week.    [provider]  ibuprofen (ADVIL) 600 MG tablet Take 1 tablet (600 mg total) by mouth every 6 (six) hours as needed. Patient not taking: Reported on 01/08/2023 05/15/22   Fayrene Helper, PA-C  Multiple Vitamin (MULTIVITAMINS PO) Take 1 tablet by mouth daily.    [provider]  olmesartan-hydrochlorothiazide (BENICAR HCT) 20-12.5 MG tablet Take 1 tablet by mouth daily.  02/04/23   Gwyneth Sprout, MD  predniSONE (DELTASONE) 20 MG tablet Take 2 tablets (40 mg total) by mouth daily. 02/04/23   Gwyneth Sprout, MD  pantoprazole (PROTONIX) 40 MG tablet Take 1 tablet (40 mg total) by mouth daily for 14 days. Patient not taking: Reported on 01/28/2020 12/31/18 07/10/20  Cristina Gong, PA-C  sucralfate (CARAFATE) 1 g tablet Take 1 tablet (1 g total) by mouth 4 (four) times daily -  with meals and at bedtime for 14 days. Patient not taking: Reported on 01/28/2020 12/31/18 07/10/20  Cristina Gong, PA-C      Allergies    Patient has no known allergies.    Review of Systems   Review of Systems  Gastrointestinal:  Positive for diarrhea.    Physical Exam Updated Vital Signs BP (!) 133/103 (BP Location: Right Arm)   Pulse (!) 105   Temp (!) 97.5 F (36.4 C) (Oral)   Resp 18   Ht 1.575 m (5\' 2" )   Wt 89.8 kg   LMP  (LMP Unknown)   SpO2 100%   BMI 36.21 kg/m  Physical Exam Vitals and nursing note reviewed.  Constitutional:      General: She is not in acute distress.    Appearance: She is well-developed.  HENT:     Head: Normocephalic and atraumatic.     Right Ear: External ear normal.     Left  Ear: External ear normal.  Eyes:     General: No scleral icterus.       Right eye: No discharge.        Left eye: No discharge.     Conjunctiva/sclera: Conjunctivae normal.  Neck:     Trachea: No tracheal deviation.  Cardiovascular:     Rate and Rhythm: Normal rate and regular rhythm.  Pulmonary:     Effort: Pulmonary effort is normal. No respiratory distress.     Breath sounds: Normal breath sounds. No stridor. No wheezing or rales.  Abdominal:     General: Bowel sounds are normal. There is no distension.     Palpations: Abdomen is soft.     Tenderness: There is abdominal tenderness. There is no guarding or rebound.  Genitourinary:    Comments: No mass appreciated on the rectal exam, slight red tinge to otherwise brownish mucoid  stool Musculoskeletal:        General: No tenderness or deformity.     Cervical back: Neck supple.  Skin:    General: Skin is warm and dry.     Findings: No rash.  Neurological:     General: No focal deficit present.     Mental Status: She is alert.     Cranial Nerves: No cranial nerve deficit, dysarthria or facial asymmetry.     Sensory: No sensory deficit.     Motor: No abnormal muscle tone or seizure activity.     Coordination: Coordination normal.  Psychiatric:        Mood and Affect: Mood normal.     ED Results / Procedures / Treatments   Labs (all labs ordered are listed, but only abnormal results are displayed) Labs Reviewed  COMPREHENSIVE METABOLIC PANEL - Abnormal; Notable for the following components:      Result Value   Potassium 3.4 (*)    Total Protein 8.5 (*)    All other components within normal limits  CBC - Abnormal; Notable for the following components:   WBC 10.7 (*)    MCH 25.6 (*)    All other components within normal limits  URINALYSIS, ROUTINE W REFLEX MICROSCOPIC - Abnormal; Notable for the following components:   Hgb urine dipstick MODERATE (*)    Protein, ur 100 (*)    Bacteria, UA RARE (*)    All other components within normal limits  POC OCCULT BLOOD, ED - Abnormal; Notable for the following components:   Fecal Occult Bld POSITIVE (*)    All other components within normal limits  LIPASE, BLOOD  PROTIME-INR  APTT  I-STAT BETA HCG BLOOD, ED ([facility], [facility], [facility] ONLY)  TYPE AND SCREEN    EKG None  Radiology CT ABDOMEN PELVIS W CONTRAST  Result Date: 03/05/2023 CLINICAL DATA:  Diarrhea for 3-4 weeks with left lower quadrant abdominal pain. Some nausea EXAM: CT ABDOMEN AND PELVIS WITH CONTRAST TECHNIQUE: Multidetector CT imaging of the abdomen and pelvis was performed using the standard protocol following bolus administration of intravenous contrast. RADIATION DOSE REDUCTION: This exam was performed according to the departmental dose-optimization  program which includes automated exposure control, adjustment of the mA and/or kV according to patient size and/or use of iterative reconstruction technique. CONTRAST:  OMNIPAQUE IOHEXOL 300 MG/ML  SOLN COMPARISON:  CT 12/27/2018 FINDINGS: Lower chest: There is some linear opacity lung bases likely scar or atelectasis. Minimal components of ground-glass. No pleural effusion. There is a 3 mm nodule seen left lower lobe on series 5, image 10. Unchanged in  retrospect. No specific imaging follow up with long-term stability. Hepatobiliary: Liver is slightly enlarged and has fatty infiltration. More focal fat deposition as well adjacent to the falciform ligament. Patent portal vein. Gallbladder is nondilated. Pancreas: Unremarkable. No pancreatic ductal dilatation or surrounding inflammatory changes. Spleen: Normal in size without focal abnormality.  Small splenule. Adrenals/Urinary Tract: Adrenal glands are unremarkable. Kidneys are normal, without renal calculi, focal lesion, or hydronephrosis. Bladder is unremarkable. Stomach/Bowel: Large bowel has a normal course and caliber with scattered stool. Normal appendix seen in the central pelvis. The stomach and small bowel are nondilated. Mild stool. Vascular/Lymphatic: No significant vascular findings are present. No enlarged abdominal or pelvic lymph nodes. Reproductive: Uterus and bilateral adnexa are unremarkable. Other: Rectus muscle diastasis identified. Protuberant anterior abdominal wall in the midline. No free air or free fluid. Musculoskeletal: Sclerosis along both sacroiliac joints. IMPRESSION: Normal appendix.  No bowel obstruction, free air or free fluid. Enlarged liver with some fatty infiltration. Electronically Signed   By: Karen Kays M.D.   On: 03/05/2023 10:32    Procedures Procedures    Medications Ordered in ED Medications  sodium chloride 0.9 % bolus 1,000 mL (0 mLs Intravenous Stopped 03/05/23 0930)  iohexol (OMNIPAQUE) 300 MG/ML  solution 100 mL (100 mLs Intravenous Contrast Given 03/05/23 1011)    ED Course/ Medical Decision Making/ A&P Clinical Course as of 03/05/23 1147  Fri Mar 05, 2023  0849 Blood cell count elevated at 10.7 [JK]  1117 CT scan without acute abnormalities [JK]    Clinical Course User Index [JK] Linwood Dibbles, MD                             Medical Decision Making Amount and/or Complexity of Data Reviewed Labs: ordered. Radiology: ordered.  Risk Prescription drug management.   Patient presented to the ED for evaluation of loose stools for several weeks as well as some recent blood.  Stool is guaiac positive although there was no large amount of blood noted on exam.  No mass was appreciated.  Patient's laboratory tests are reassuring.  She does not have any evidence of anemia.  CT scan was performed and it does not show any acute findings.  No definitive findings to suggest ulcerative colitis or Crohn's.  Infectious etiology is a concern but patient is afebrile and around abdomen is benign.  She does not appear to require hospitalization and is stable.  Will have her follow-up with GI as an outpatient for further evaluation.  Warning signs precautions discussed        Final Clinical Impression(s) / ED Diagnoses Final diagnoses:  Blood in stool  Diarrhea, unspecified type    Rx / DC Orders ED Discharge Orders          Ordered    ondansetron (ZOFRAN-ODT) 8 MG disintegrating tablet  Every 8 hours PRN        03/05/23 1144              Linwood Dibbles, MD 03/05/23 1147

## 2023-03-14 ENCOUNTER — Encounter (HOSPITAL_COMMUNITY): Payer: Self-pay

## 2023-03-14 ENCOUNTER — Ambulatory Visit (HOSPITAL_COMMUNITY)
Admission: EM | Admit: 2023-03-14 | Discharge: 2023-03-14 | Disposition: A | Discharge status: Home / Self Care | Payer: 59 | Attending: Internal Medicine | Admitting: Internal Medicine

## 2023-03-14 DIAGNOSIS — J4521 Mild intermittent asthma with (acute) exacerbation: Secondary | ICD-10-CM

## 2023-03-14 MED ORDER — ALBUTEROL SULFATE (2.5 MG/3ML) 0.083% IN NEBU
2.5000 mg | INHALATION_SOLUTION | Freq: Once | RESPIRATORY_TRACT | Status: AC
  Administered 2023-03-14: 2.5 mg via RESPIRATORY_TRACT

## 2023-03-14 MED ORDER — PREDNISONE 10 MG PO TABS: ORAL_TABLET | ORAL | 0 refills | Status: DC

## 2023-03-14 MED ORDER — ALBUTEROL SULFATE (2.5 MG/3ML) 0.083% IN NEBU
INHALATION_SOLUTION | RESPIRATORY_TRACT | Status: AC
  Filled 2023-03-14: qty 3

## 2023-03-14 NOTE — ED Triage Notes (Signed)
SOB and wheezing onset yesterday. Patient has asthma and it is flaring up. Inhaler not working. [place] the [place] and symptoms got worse.

## 2023-03-14 NOTE — ED Provider Notes (Signed)
[facility]    CSN: [id number] Arrival date & time: 03/14/23  1632      History   Chief Complaint Chief Complaint  Patient presents with   Asthma    HPI Vanessa Foster is a 37 y.o. female with a history of allergies and asthma presents to UC today with complaint of headache, runny nose, nasal congestion, cough, shortness of breath and chest tightness.  She reports this started yesterday.  She is not blowing any colored mucus out of her nose.  The cough is mostly nonproductive.  She denies fever, chills or body aches.  She has not had sick contacts with similar symptoms.  She does smoke occasionally.  She has tried Tylenol Cold and sinus and albuterol with minimal relief of symptoms.  HPI  Past Medical History:  Diagnosis Date   Abnormal Pap smear of cervix    2011?   Allergy    Anemia    Anxiety    Asthma    hospitalization 2008, last attack this month   Chronic nausea    Depression    Dysmenorrhea    Dyspareunia    Gestational diabetes 07/13/2016   no meds   Hidradenitis suppurativa of left axilla 02/21/2021   Hypertension    Migraines    Miscarriage 03/03/2022   Mixed hyperlipidemia 02/23/2021   PID (pelvic inflammatory disease)    S/P primary low transverse C-section 10/26/2016   Wears glasses     Patient Active Problem List   Diagnosis Date Noted   Mixed hyperlipidemia 02/23/2021   Morbid obesity (HCC) 02/21/2021   Prediabetes 02/21/2021   Recurrent boils 02/21/2021   Pruritic intertrigo 02/21/2021   Mild intermittent asthma without complication 07/11/2015   Depressed mood 07/11/2015    Past Surgical History:  Procedure Laterality Date   CESAREAN SECTION     CESAREAN SECTION N/A 09/19/2016   Procedure: CESAREAN SECTION;  Surgeon:  Bing, MD;  Location: [facility];  Service: Obstetrics;  Laterality: N/A;   INCISE AND DRAIN ABCESS  2019   under arms " boils"   WISDOM TOOTH EXTRACTION      OB History     Gravida   3   Para  2   Term  2   Preterm      AB      Living  1      SAB      IAB      Ectopic      Multiple      Live Births  1            Home Medications    Prior to Admission medications   Medication Sig Start Date End Date Taking? Authorizing Provider  albuterol (VENTOLIN HFA) 108 (90 Base) MCG/ACT inhaler Inhale 2 puffs into the lungs every 6 (six) hours as needed for wheezing or shortness of breath. 09/18/22  Yes Jeannie Fend, PA-C  ergocalciferol (VITAMIN D2) 1.25 MG (50000 UT) capsule Take 50,000 Units by mouth once a week.   Yes [provider]  Multiple Vitamin (MULTIVITAMINS PO) Take 1 tablet by mouth daily.   Yes [provider]  olmesartan-hydrochlorothiazide (BENICAR HCT) 20-12.5 MG tablet Take 1 tablet by mouth daily. 02/04/23  Yes Gwyneth Sprout, MD  predniSONE (DELTASONE) 10 MG tablet Take 6 tabs on day 1, 5 tabs on day 2, 4 tabs on day 3, 3 tabs on day 4, 2 tabs on day 5, 1 tab on day 6 03/14/23  Yes  Lorre Munroe, NP  cyclobenzaprine (FLEXERIL) 10 MG tablet Take 1 tablet (10 mg total) by mouth 2 (two) times daily as needed for muscle spasms. Patient not taking: Reported on 01/08/2023 05/15/22   Fayrene Helper, PA-C  ibuprofen (ADVIL) 600 MG tablet Take 1 tablet (600 mg total) by mouth every 6 (six) hours as needed. Patient not taking: Reported on 01/08/2023 05/15/22   Fayrene Helper, PA-C  ondansetron (ZOFRAN-ODT) 8 MG disintegrating tablet Take 1 tablet (8 mg total) by mouth every 8 (eight) hours as needed for nausea or vomiting. 03/05/23   Linwood Dibbles, MD  pantoprazole (PROTONIX) 40 MG tablet Take 1 tablet (40 mg total) by mouth daily for 14 days. Patient not taking: Reported on 01/28/2020 12/31/18 07/10/20  Cristina Gong, PA-C  sucralfate (CARAFATE) 1 g tablet Take 1 tablet (1 g total) by mouth 4 (four) times daily -  with meals and at bedtime for 14 days. Patient not taking: Reported on 01/28/2020 12/31/18 07/10/20  Cristina Gong,  PA-C    Family History Family History  Problem Relation Age of Onset   Hypertension Mother    Heart disease Mother        early 47s   Diabetes Mother    Diabetes Paternal Grandmother    Stroke Paternal Grandmother     Social History Social History   Tobacco Use   Smoking status: Former    Types: Cigarettes    Quit date: 03/20/2008    Years since quitting: 14.9   Smokeless tobacco: Never  Vaping Use   Vaping Use: Never used  Substance Use Topics   Alcohol use: Yes   Drug use: Yes    Types: Marijuana     Allergies   Patient has no known allergies.   Review of Systems Review of Systems  Constitutional:  Negative for appetite change, chills, fatigue and fever.  HENT:  Positive for congestion, rhinorrhea, sinus pressure and sinus pain. Negative for ear pain and sore throat.   Respiratory:  Positive for cough, chest tightness, shortness of breath and wheezing.   Cardiovascular:  Negative for chest pain.  Gastrointestinal:  Negative for diarrhea, nausea and vomiting.  Musculoskeletal:  Negative for arthralgias and myalgias.  Skin:  Negative for rash.  Neurological:  Positive for headaches. Negative for dizziness.     Physical Exam Triage Vital Signs ED Triage Vitals  Enc Vitals Group     BP 03/14/23 1730 (!) 126/94     Pulse Rate 03/14/23 1730 75     Resp 03/14/23 1730 20     Temp 03/14/23 1730 98 F (36.7 C)     Temp Source 03/14/23 1730 Oral     SpO2 03/14/23 1730 98 %     Weight --      Height --      Head Circumference --      Peak Flow --      Pain Score 03/14/23 1728 0     Pain Loc --      Pain Edu? --      Excl. in GC? --    No data found.  Updated Vital Signs BP (!) 126/94 (BP Location: Left Arm)   Pulse 75   Temp 98 F (36.7 C) (Oral)   Resp 20   LMP  (LMP Unknown)   SpO2 98%      Physical Exam Constitutional:      Appearance: She is obese.  HENT:     Head: Normocephalic.  Comments: Maxillary sinus pressure noted.    Right  Ear: Tympanic membrane, ear canal and external ear normal.     Left Ear: Tympanic membrane, ear canal and external ear normal.     Nose: Nose normal.     Mouth/Throat:     Mouth: Mucous membranes are dry.     Pharynx: Oropharynx is clear. No oropharyngeal exudate or posterior oropharyngeal erythema.  Eyes:     Conjunctiva/sclera: Conjunctivae normal.     Pupils: Pupils are equal, round, and reactive to light.  Cardiovascular:     Rate and Rhythm: Normal rate and regular rhythm.     Heart sounds: Normal heart sounds.  Pulmonary:     Effort: Pulmonary effort is normal.     Breath sounds: Wheezing present.     Comments: Diminished breath sounds Lymphadenopathy:     Cervical: No cervical adenopathy.  Skin:    General: Skin is warm and dry.     Findings: No rash.  Neurological:     Mental Status: She is alert and oriented to person, place, and time.      UC Treatments / Results   Medications Ordered in UC Medications  albuterol (PROVENTIL) (2.5 MG/3ML) 0.083% nebulizer solution 2.5 mg (2.5 mg Nebulization Given 03/14/23 1802)    Initial Impression / Assessment and Plan / UC Course  I have reviewed the triage vital signs and the nursing notes.  Pertinent labs & imaging results that were available during my care of the patient were reviewed by me and considered in my medical decision making (see chart for details).  37 year old female with history of asthma and allergies with 1 day history of URI symptoms, cough, wheezing and shortness of breath.  DDx include allergic rhinitis, viral URI with cough, asthma exacerbation.  Exam most consistent with asthma exacerbation.  No indication for chest x-ray at this time.  Albuterol neb given in office.  Rx for Pred taper x 6 days for symptom management.  No indication for antibiotics at this time.  She has albuterol to use as needed. Follow up precautions discussed.  Final Clinical Impressions(s) / UC Diagnoses   Final diagnoses:  Mild  intermittent asthma with exacerbation     Discharge Instructions      You were seen today for upper respiratory symptoms, cough and shortness of breath.  Your exam is consistent with an asthma exacerbation.  You received a albuterol nebulizer treatment in the office.  We are putting you on prednisone for 6 days for symptom management.  Continue albuterol as needed.  Please follow-up with your PCP if symptoms persist or worsen.     ED Prescriptions     Medication Sig Dispense Auth. Provider   predniSONE (DELTASONE) 10 MG tablet Take 6 tabs on day 1, 5 tabs on day 2, 4 tabs on day 3, 3 tabs on day 4, 2 tabs on day 5, 1 tab on day 6 21 tablet Aharon Carriere, Salvadore Oxford, NP      PDMP not reviewed this encounter.   Lorre Munroe, NP 03/14/23 [phone]

## 2023-03-14 NOTE — Discharge Instructions (Signed)
You were seen today for upper respiratory symptoms, cough and shortness of breath.  Your exam is consistent with an asthma exacerbation.  You received a albuterol nebulizer treatment in the office.  We are putting you on prednisone for 6 days for symptom management.  Continue albuterol as needed.  Please follow-up with your PCP if symptoms persist or worsen.

## 2023-03-17 ENCOUNTER — Other Ambulatory Visit: Payer: Self-pay

## 2023-03-17 ENCOUNTER — Emergency Department (HOSPITAL_COMMUNITY): Payer: 59

## 2023-03-17 ENCOUNTER — Emergency Department (HOSPITAL_COMMUNITY)
Admission: EM | Admit: 2023-03-17 | Discharge: 2023-03-18 | Discharge status: Against Medical Advice | Payer: 59 | Attending: Emergency Medicine | Admitting: Emergency Medicine

## 2023-03-17 ENCOUNTER — Encounter (HOSPITAL_COMMUNITY): Payer: Self-pay

## 2023-03-17 DIAGNOSIS — R0602 Shortness of breath: Secondary | ICD-10-CM | POA: Diagnosis not present

## 2023-03-17 DIAGNOSIS — J4541 Moderate persistent asthma with (acute) exacerbation: Secondary | ICD-10-CM | POA: Insufficient documentation

## 2023-03-17 DIAGNOSIS — Z5321 Procedure and treatment not carried out due to patient leaving prior to being seen by health care provider: Secondary | ICD-10-CM | POA: Insufficient documentation

## 2023-03-17 DIAGNOSIS — J45909 Unspecified asthma, uncomplicated: Secondary | ICD-10-CM | POA: Insufficient documentation

## 2023-03-17 MED ORDER — ALBUTEROL SULFATE HFA 108 (90 BASE) MCG/ACT IN AERS: 2.0000 | INHALATION_SPRAY | RESPIRATORY_TRACT | Status: DC | PRN

## 2023-03-17 NOTE — ED Triage Notes (Signed)
SOB since Saturday. Pt states she has asthma and is having no relief with home inhaler. States she does something for 10 minutes and then gets short of breath and has to rest.

## 2023-03-18 ENCOUNTER — Emergency Department (HOSPITAL_COMMUNITY)
Admission: EM | Admit: 2023-03-18 | Discharge: 2023-03-18 | Disposition: A | Discharge status: Home / Self Care | Payer: 59 | Source: Home / Self Care | Attending: Emergency Medicine | Admitting: Emergency Medicine

## 2023-03-18 ENCOUNTER — Encounter (HOSPITAL_COMMUNITY): Payer: Self-pay

## 2023-03-18 ENCOUNTER — Other Ambulatory Visit: Payer: Self-pay

## 2023-03-18 DIAGNOSIS — R0602 Shortness of breath: Secondary | ICD-10-CM | POA: Diagnosis not present

## 2023-03-18 DIAGNOSIS — J4541 Moderate persistent asthma with (acute) exacerbation: Secondary | ICD-10-CM | POA: Insufficient documentation

## 2023-03-18 MED ORDER — ALBUTEROL SULFATE (2.5 MG/3ML) 0.083% IN NEBU
2.5000 mg | INHALATION_SOLUTION | Freq: Once | RESPIRATORY_TRACT | Status: AC
  Administered 2023-03-18: 2.5 mg via RESPIRATORY_TRACT
  Filled 2023-03-18: qty 3

## 2023-03-18 MED ORDER — IPRATROPIUM-ALBUTEROL 0.5-2.5 (3) MG/3ML IN SOLN
3.0000 mL | Freq: Once | RESPIRATORY_TRACT | Status: AC
  Administered 2023-03-18: 3 mL via RESPIRATORY_TRACT
  Filled 2023-03-18: qty 3

## 2023-03-18 MED ORDER — PREDNISONE 10 MG (21) PO TBPK: ORAL_TABLET | Freq: Every day | ORAL | 0 refills | Status: AC

## 2023-03-18 MED ORDER — ALBUTEROL SULFATE (2.5 MG/3ML) 0.083% IN NEBU: 2.5000 mg | INHALATION_SOLUTION | Freq: Four times a day (QID) | RESPIRATORY_TRACT | 12 refills | Status: AC | PRN

## 2023-03-18 NOTE — ED Provider Notes (Signed)
[place] EMERGENCY DEPARTMENT AT [facility] Provider Note   CSN: [id number] Arrival date & time: 03/18/23  1226     History  Chief Complaint  Patient presents with   Cough         Vanessa Foster is a 37 y.o. female.   Cough  Patient is a 37 year old female with past medical history significant for asthma she states that she primarily suffers from a cough when she has asthma exacerbations but has been noticing some wheezing for the past few days.  She states that she has been having some wheezing coughing and shortness of breath over the past 5 days.  She feels that this is her asthma.  She denies any chest pain but rather tightness.  No nausea or vomiting no lightheadedness or dizziness.  No fevers, no hemoptysis.  She denies any leg swelling  No recent surgeries, hospitalization, long travel, hemoptysis, estrogen containing OCP, cancer history.  No unilateral leg swelling.  No history of PE or VTE.       Home Medications Prior to Admission medications   Medication Sig Start Date End Date Taking? Authorizing Provider  albuterol (PROVENTIL) (2.5 MG/3ML) 0.083% nebulizer solution Take 3 mLs (2.5 mg total) by nebulization every 6 (six) hours as needed for wheezing or shortness of breath. 03/18/23  Yes Marvelous Woolford S, PA  predniSONE (STERAPRED UNI-PAK 21 TAB) 10 MG (21) TBPK tablet Take by mouth daily. Take 6 tabs by mouth daily  for 2 days, then 5 tabs for 2 days, then 4 tabs for 2 days, then 3 tabs for 2 days, 2 tabs for 2 days, then 1 tab by mouth daily for 2 days 03/18/23  Yes Amie Cowens S, PA  cyclobenzaprine (FLEXERIL) 10 MG tablet Take 1 tablet (10 mg total) by mouth 2 (two) times daily as needed for muscle spasms. Patient not taking: Reported on 01/08/2023 05/15/22   Fayrene Helper, PA-C  ergocalciferol (VITAMIN D2) 1.25 MG (50000 UT) capsule Take 50,000 Units by mouth once a week.    [provider]  ibuprofen (ADVIL) 600 MG tablet Take 1  tablet (600 mg total) by mouth every 6 (six) hours as needed. Patient not taking: Reported on 01/08/2023 05/15/22   Fayrene Helper, PA-C  Multiple Vitamin (MULTIVITAMINS PO) Take 1 tablet by mouth daily.    [provider]  olmesartan-hydrochlorothiazide (BENICAR HCT) 20-12.5 MG tablet Take 1 tablet by mouth daily. 02/04/23   Gwyneth Sprout, MD  ondansetron (ZOFRAN-ODT) 8 MG disintegrating tablet Take 1 tablet (8 mg total) by mouth every 8 (eight) hours as needed for nausea or vomiting. 03/05/23   Linwood Dibbles, MD  pantoprazole (PROTONIX) 40 MG tablet Take 1 tablet (40 mg total) by mouth daily for 14 days. Patient not taking: Reported on 01/28/2020 12/31/18 07/10/20  Cristina Gong, PA-C  sucralfate (CARAFATE) 1 g tablet Take 1 tablet (1 g total) by mouth 4 (four) times daily -  with meals and at bedtime for 14 days. Patient not taking: Reported on 01/28/2020 12/31/18 07/10/20  Cristina Gong, PA-C      Allergies    Patient has no known allergies.    Review of Systems   Review of Systems  Respiratory:  Positive for cough.     Physical Exam Updated Vital Signs BP (!) 153/118 (BP Location: Left Arm)   Pulse 85   Temp 98.3 F (36.8 C) (Oral)   Resp 17   Ht 5\' 2"  (1.575 m)   Wt  89.9 kg   LMP  (LMP Unknown)   SpO2 100%   BMI 36.25 kg/m  Physical Exam Vitals and nursing note reviewed.  Constitutional:      General: She is not in acute distress. HENT:     Head: Normocephalic and atraumatic.     Nose: Nose normal.     Mouth/Throat:     Mouth: Mucous membranes are moist.  Eyes:     General: No scleral icterus. Cardiovascular:     Rate and Rhythm: Normal rate and regular rhythm.     Pulses: Normal pulses.     Heart sounds: Normal heart sounds.  Pulmonary:     Effort: Pulmonary effort is normal. No respiratory distress.     Breath sounds: Wheezing present.     Comments: Faint expiratory wheeze.  Speaking in full sentences. Abdominal:     Palpations: Abdomen is soft.      Tenderness: There is no abdominal tenderness. There is no guarding or rebound.  Musculoskeletal:     Cervical back: Normal range of motion.     Right lower leg: No edema.     Left lower leg: No edema.  Skin:    General: Skin is warm and dry.     Capillary Refill: Capillary refill takes less than 2 seconds.  Neurological:     Mental Status: She is alert. Mental status is at baseline.  Psychiatric:        Mood and Affect: Mood normal.        Behavior: Behavior normal.     ED Results / Procedures / Treatments   Labs (all labs ordered are listed, but only abnormal results are displayed) Labs Reviewed - No data to display  EKG None  Radiology DG Chest 2 View  Result Date: 03/17/2023 CLINICAL DATA:  Shortness of breath. EXAM: CHEST - 2 VIEW COMPARISON:  Chest radiograph dated 02/04/2023. FINDINGS: No focal consolidation, pleural effusion, or pneumothorax. The cardiac silhouette is within normal limits. No acute osseous pathology. IMPRESSION: No active cardiopulmonary disease. Electronically Signed   By: Elgie Collard M.D.   On: 03/17/2023 22:25    Procedures Procedures    Medications Ordered in ED Medications  ipratropium-albuterol (DUONEB) 0.5-2.5 (3) MG/3ML nebulizer solution 3 mL (has no administration in time range)  albuterol (PROVENTIL) (2.5 MG/3ML) 0.083% nebulizer solution 2.5 mg (has no administration in time range)    ED Course/ Medical Decision Making/ A&P                             Medical Decision Making Risk Prescription drug management.   This patient presents to the ED for concern of cough/sob, this involves a number of treatment options, and is a complaint that carries with it a moderate risk of complications and morbidity. A differential diagnosis was considered for the patient's symptoms which is discussed below:   Differential diagnosis for emergent cause of cough includes but is not limited to upper respiratory infection, lower respiratory  infection, allergies, asthma, irritants, foreign body, medications such as ACE inhibitors, reflux, asthma, CHF, lung cancer, interstitial lung disease, psychiatric causes, postnasal drip and postinfectious bronchospasm.    Co morbidities: Discussed in HPI   Brief History:  Patient is a 37 year old female with past medical history significant for asthma she states that she primarily suffers from a cough when she has asthma exacerbations but has been noticing some wheezing for the past few days.  She states that she  has been having some wheezing coughing and shortness of breath over the past 5 days.  She feels that this is her asthma.  She denies any chest pain but rather tightness.  No nausea or vomiting no lightheadedness or dizziness.  No fevers, no hemoptysis.  She denies any leg swelling  No recent surgeries, hospitalization, long travel, hemoptysis, estrogen containing OCP, cancer history.  No unilateral leg swelling.  No history of PE or VTE.     EMR reviewed including pt PMHx, past surgical history and past visits to ER.   See HPI for more details   Lab Tests:      Imaging Studies:      Cardiac Monitoring:  The patient was maintained on a cardiac monitor.  I personally viewed and interpreted the cardiac monitored which showed an underlying rhythm of: NSR    Medicines ordered:  I ordered medication including DuoNeb for SOB Reevaluation of the patient after these medicines showed that the patient improved I have reviewed the patients home medicines and have made adjustments as needed   Critical Interventions:     Consults/Attending Physician      Reevaluation:  After the interventions noted above I re-evaluated patient and found that they have :resolved   Social Determinants of Health:      Problem List / ED Course:  Patient with cough and shortness of breath with some wheezing.  She is 100% on room air SpO2.  She is well-appearing on exam  but is wheezing some.  She feels much improved after a DuoNeb and is no longer wheezing.  Will discharge her home with prednisone taper and prescribed her a nebulizer machine for home use.  Return precautions emergency room discussed.   Dispostion:  After consideration of the diagnostic results and the patients response to treatment, I feel that the patent would benefit from discharge.    Final Clinical Impression(s) / ED Diagnoses Final diagnoses:  Moderate persistent asthma with acute exacerbation    Rx / DC Orders ED Discharge Orders          Ordered    predniSONE (STERAPRED UNI-PAK 21 TAB) 10 MG (21) TBPK tablet  Daily        03/18/23 1308    albuterol (PROVENTIL) (2.5 MG/3ML) 0.083% nebulizer solution  Every 6 hours PRN        03/18/23 1308    For home use only DME Nebulizer machine        03/18/23 1308              Solon [place]a [place], [place] 03/18/23 1628    Glyn Ade, MD 03/18/23 [phone]

## 2023-03-18 NOTE — Discharge Instructions (Addendum)
Take all of your prescribed medications as directed.  Take the prednisone that I have prescribed you. Use the albuterol nebulizer that I prescribed you.  I recommend that you use it once every 4 hours for 2 or 3 treatments and after that you can use as needed.   [phone]

## 2023-03-18 NOTE — ED Triage Notes (Addendum)
SOB x5 days. Reports received neb treatment Monday and had relief. No relief with inhaler. NAD in triage, able to speak in full sentences.

## 2023-10-27 ENCOUNTER — Encounter (HOSPITAL_COMMUNITY): Payer: Self-pay | Admitting: *Deleted

## 2023-10-27 ENCOUNTER — Emergency Department (HOSPITAL_COMMUNITY): Payer: Medicaid Other

## 2023-10-27 ENCOUNTER — Other Ambulatory Visit: Payer: Self-pay

## 2023-10-27 ENCOUNTER — Emergency Department (HOSPITAL_COMMUNITY)
Admission: EM | Admit: 2023-10-27 | Discharge: 2023-10-28 | Disposition: A | Discharge status: Home / Self Care | Payer: Medicaid Other | Attending: Emergency Medicine | Admitting: Emergency Medicine

## 2023-10-27 DIAGNOSIS — Z79899 Other long term (current) drug therapy: Secondary | ICD-10-CM | POA: Diagnosis not present

## 2023-10-27 DIAGNOSIS — R0602 Shortness of breath: Secondary | ICD-10-CM | POA: Diagnosis present

## 2023-10-27 DIAGNOSIS — Z20822 Contact with and (suspected) exposure to covid-19: Secondary | ICD-10-CM | POA: Insufficient documentation

## 2023-10-27 DIAGNOSIS — Z7951 Long term (current) use of inhaled steroids: Secondary | ICD-10-CM | POA: Insufficient documentation

## 2023-10-27 DIAGNOSIS — I10 Essential (primary) hypertension: Secondary | ICD-10-CM | POA: Diagnosis not present

## 2023-10-27 DIAGNOSIS — J4541 Moderate persistent asthma with (acute) exacerbation: Secondary | ICD-10-CM | POA: Diagnosis not present

## 2023-10-27 LAB — RESP PANEL BY RT-PCR (RSV, FLU A&B, COVID)  RVPGX2
Influenza A by PCR: NEGATIVE
Influenza B by PCR: NEGATIVE
Resp Syncytial Virus by PCR: NEGATIVE
SARS Coronavirus 2 by RT PCR: NEGATIVE

## 2023-10-27 MED ORDER — IPRATROPIUM-ALBUTEROL 0.5-2.5 (3) MG/3ML IN SOLN
3.0000 mL | Freq: Once | RESPIRATORY_TRACT | Status: AC
  Administered 2023-10-28: 3 mL via RESPIRATORY_TRACT
  Filled 2023-10-27: qty 3

## 2023-10-27 NOTE — ED Provider Triage Note (Signed)
 Emergency Medicine Provider Triage Evaluation Note  Vanessa Foster , a 38 y.o. female  was evaluated in triage.  Pt complains of shortness of breath for 1 day, cough for 2 days.  Denies any sore throat.  History of asthma and feels like similar.  Denies any chest pain..  Review of Systems  Positive: Shortness of breath, wheezing Negative: Chest pain  Physical Exam  BP (!) 147/108 (BP Location: Right Arm)   Pulse 92   Temp 98.9 F (37.2 C) (Oral)   Resp 20   Ht 5' 2 (1.575 m)   Wt 89.9 kg   SpO2 100%   BMI 36.25 kg/m  Gen:   Awake, no distress   Resp:  Normal effort  MSK:   Moves extremities without difficulty  Other:  Diffuse wheezing, shallow breathing, increased work of breathing throughout.  No focal consolidation noted.  Medical Decision Making  Medically screening exam initiated at 3:27 PM.  Appropriate orders placed.  Vanessa Foster was informed that the remainder of the evaluation will be completed by another provider, this initial triage assessment does not replace that evaluation, and the importance of remaining in the ED until their evaluation is complete.  Workup initiated in triage    Vanessa Foster, [place] 10/27/23 1528

## 2023-10-27 NOTE — ED Triage Notes (Signed)
 She is also c/o abd and chest pain

## 2023-10-27 NOTE — ED Triage Notes (Signed)
 The pt reports that she has been son since this am  she has asthma no audible wheezes  lmp dec

## 2023-10-28 MED ORDER — ALBUTEROL SULFATE HFA 108 (90 BASE) MCG/ACT IN AERS
2.0000 | INHALATION_SPRAY | Freq: Once | RESPIRATORY_TRACT | Status: AC
  Administered 2023-10-28: 2 via RESPIRATORY_TRACT
  Filled 2023-10-28: qty 6.7

## 2023-10-28 MED ORDER — ALBUTEROL SULFATE (2.5 MG/3ML) 0.083% IN NEBU
10.0000 mg | INHALATION_SOLUTION | Freq: Once | RESPIRATORY_TRACT | Status: AC
Start: 2023-10-28 — End: 2023-10-28
  Administered 2023-10-28: 10 mg via RESPIRATORY_TRACT
  Filled 2023-10-28: qty 12

## 2023-10-28 MED ORDER — DEXAMETHASONE SODIUM PHOSPHATE 10 MG/ML IJ SOLN
10.0000 mg | Freq: Once | INTRAMUSCULAR | Status: AC
  Administered 2023-10-28: 10 mg via INTRAMUSCULAR
  Filled 2023-10-28: qty 1

## 2023-10-28 MED ORDER — IPRATROPIUM BROMIDE 0.02 % IN SOLN
0.5000 mg | Freq: Once | RESPIRATORY_TRACT | Status: AC
  Administered 2023-10-28: 0.5 mg via RESPIRATORY_TRACT
  Filled 2023-10-28: qty 2.5

## 2023-10-28 NOTE — Discharge Instructions (Signed)
 You were seen today for your asthma.  Your workup is reassuring.  Continue your inhaler at home.  You were given a dose of steroids here.  That should cover you.  If you have any new or worsening symptoms, you should be reevaluated.

## 2023-10-28 NOTE — ED Notes (Signed)
 This RN reviewed discharge instructions with patient. She verbalized understanding and denied any further questions. PT well appearing upon discharge and reports no pain. Pt ambulated with stable gait to exit. Pt endorses ride home.

## 2023-10-28 NOTE — ED Notes (Signed)
 Pt ambulated with steady gait and oxygen saturation was between 98-100% RA. Pt did report feeling sob.

## 2023-10-28 NOTE — ED Provider Notes (Signed)
 [place] EMERGENCY DEPARTMENT AT [facility] Provider Note   CSN: [id number] Arrival date & time: 10/27/23  1454     History  Chief Complaint  Patient presents with   Shortness of Breath    Vanessa Foster is a 38 y.o. female.  HPI     This is a 38 year old female who presents with concerns for asthma exacerbation.  Reports 24 to 48-hour history of worsening shortness of breath.  No fevers but does endorse a dry cough.  She states that she normally gets asthma when she is getting a viral illness or a cold.  Last took steroids approximately 2 months ago.  Denies any sick contacts.  Took her inhalers at home with minimal relief.  Home Medications Prior to Admission medications   Medication Sig Start Date End Date Taking? Authorizing Provider  albuterol  (PROVENTIL ) (2.5 MG/3ML) 0.083% nebulizer solution Take 3 mLs (2.5 mg total) by nebulization every 6 (six) hours as needed for wheezing or shortness of breath. 03/18/23   Fondaw, Hamp RAMAN, PA  cyclobenzaprine  (FLEXERIL ) 10 MG tablet Take 1 tablet (10 mg total) by mouth 2 (two) times daily as needed for muscle spasms. Patient not taking: Reported on 01/08/2023 05/15/22   Nivia Colon, PA-C  ergocalciferol (VITAMIN D2) 1.25 MG (50000 UT) capsule Take 50,000 Units by mouth once a week.    [provider]  ibuprofen  (ADVIL ) 600 MG tablet Take 1 tablet (600 mg total) by mouth every 6 (six) hours as needed. Patient not taking: Reported on 01/08/2023 05/15/22   Tran, Bowie, PA-C  Multiple Vitamin (MULTIVITAMINS PO) Take 1 tablet by mouth daily.    [provider]  olmesartan -hydrochlorothiazide (BENICAR  HCT) 20-12.5 MG tablet Take 1 tablet by mouth daily. 02/04/23   Doretha Folks, MD  ondansetron  (ZOFRAN -ODT) 8 MG disintegrating tablet Take 1 tablet (8 mg total) by mouth every 8 (eight) hours as needed for nausea or vomiting. 03/05/23   Randol Simmonds, MD  predniSONE  (STERAPRED UNI-PAK 21 TAB) 10 MG (21) TBPK tablet  Take by mouth daily. Take 6 tabs by mouth daily  for 2 days, then 5 tabs for 2 days, then 4 tabs for 2 days, then 3 tabs for 2 days, 2 tabs for 2 days, then 1 tab by mouth daily for 2 days 03/18/23   Neldon Hamp RAMAN, PA  pantoprazole  (PROTONIX ) 40 MG tablet Take 1 tablet (40 mg total) by mouth daily for 14 days. Patient not taking: Reported on 01/28/2020 12/31/18 07/10/20  Windle Almarie ORN, PA-C  sucralfate  (CARAFATE ) 1 g tablet Take 1 tablet (1 g total) by mouth 4 (four) times daily -  with meals and at bedtime for 14 days. Patient not taking: Reported on 01/28/2020 12/31/18 07/10/20  Windle Almarie ORN, PA-C      Allergies    Patient has no known allergies.    Review of Systems   Review of Systems  Constitutional:  Negative for fever.  Respiratory:  Positive for cough and shortness of breath.   Cardiovascular:  Negative for chest pain.  All other systems reviewed and are negative.   Physical Exam Updated Vital Signs BP (!) 160/109   Pulse (!) 107   Temp 98.5 F (36.9 C) (Oral)   Resp 20   Ht 1.575 m (5' 2)   Wt 89.9 kg   SpO2 95%   BMI 36.25 kg/m  Physical Exam Vitals and nursing note reviewed.  Constitutional:      Appearance: She is well-developed.  HENT:  Head: Normocephalic and atraumatic.  Eyes:     Pupils: Pupils are equal, round, and reactive to light.  Cardiovascular:     Rate and Rhythm: Normal rate and regular rhythm.     Heart sounds: Normal heart sounds.  Pulmonary:     Effort: Pulmonary effort is normal. No respiratory distress.     Breath sounds: No wheezing.     Comments: Speaking in full sentences, no acute distress, fair air movement with diffuse expiratory wheezing Abdominal:     General: Bowel sounds are normal.     Palpations: Abdomen is soft.  Musculoskeletal:     Cervical back: Neck supple.  Skin:    General: Skin is warm and dry.  Neurological:     Mental Status: She is alert and oriented to person, place, and time.     ED Results  / Procedures / Treatments   Labs (all labs ordered are listed, but only abnormal results are displayed) Labs Reviewed  RESP PANEL BY RT-PCR (RSV, FLU A&B, COVID)  [id number]    EKG EKG Interpretation Date/Time:  Wednesday October 27 2023 15:12:17 EST Ventricular Rate:  90 PR Interval:  136 QRS Duration:  82 QT Interval:  356 QTC Calculation: 435 R Axis:   82  Text Interpretation: Normal sinus rhythm Normal ECG When compared with ECG of 04-Feb-2023 19:55, PREVIOUS ECG IS PRESENT Confirmed by Bari Pfeiffer ([id number]) on 10/28/2023 2:25:40 AM  Radiology DG Chest 2 View Result Date: 10/27/2023 CLINICAL DATA:  Shortness of breath. EXAM: CHEST - 2 VIEW COMPARISON:  03/17/2023. FINDINGS: Bilateral lung fields are clear. Bilateral costophrenic angles are clear. Normal cardio-mediastinal silhouette. No acute osseous abnormalities. The soft tissues are within normal limits. IMPRESSION: No active cardiopulmonary disease. Electronically Signed   By: Ree Molt M.D.   On: 10/27/2023 17:48    Procedures Procedures    Medications Ordered in ED Medications  ipratropium-albuterol  (DUONEB) 0.5-2.5 (3) MG/3ML nebulizer solution 3 mL (3 mLs Nebulization Given 10/28/23 0228)  dexamethasone  (DECADRON ) injection 10 mg (10 mg Intramuscular Given 10/28/23 0354)  albuterol  (PROVENTIL ) (2.5 MG/3ML) 0.083% nebulizer solution 10 mg (10 mg Nebulization Given 10/28/23 0354)  ipratropium (ATROVENT ) nebulizer solution 0.5 mg (0.5 mg Nebulization Given 10/28/23 0354)  albuterol  (VENTOLIN  HFA) 108 (90 Base) MCG/ACT inhaler 2 puff (2 puffs Inhalation Given 10/28/23 0446)    ED Course/ Medical Decision Making/ A&P Clinical Course as of 10/28/23 0448  Thu Oct 28, 2023  0408 Patient feels somewhat improved.  Finished her DuoNeb.  She still has some wheezing but overall clinically looks better.  Will ambulate with pulse ox and provide with an inhaler. [CH]  0447 Patient ambulated and was able to maintain her pulse ox.   Discharged with an inhaler.  Patient was given strict return precautions. [CH]    Clinical Course User Index [CH] Corran Lalone, Pfeiffer FALCON, MD                                 Medical Decision Making Risk Prescription drug management.   This patient presents to the ED for concern of shortness of breath, this involves an extensive number of treatment options, and is a complaint that carries with it a high risk of complications and morbidity.  I considered the following differential and admission for this acute, potentially life threatening condition.  The differential diagnosis includes asthma exacerbation, and pneumonia, viral illness  MDM:    This is a 38 year old  female with history of asthma who presents with shortness of breath.  She is wheezing on exam.  She is overall nontoxic.  Not hypoxic.  Mildly tachycardic.  Patient was given a nebulizer.  She was also given Decadron .  COVID and influenza testing negative.  Chest x-ray without obvious pneumonia or pneumothorax.  Patient had progressive improvement.  She was ultimately able to ambulate and maintain her pulse ox.  Will discharge home with an inhaler.  (Labs, imaging, consults)  Labs: I Ordered, and personally interpreted labs.  The pertinent results include: COVID, influenza  Imaging Studies ordered: I ordered imaging studies including chest x-ray I independently visualized and interpreted imaging. I agree with the radiologist interpretation  Additional history obtained from chart review.  External records from outside source obtained and reviewed including prior evaluations  Cardiac Monitoring: The patient was maintained on a cardiac monitor.  If on the cardiac monitor, I personally viewed and interpreted the cardiac monitored which showed an underlying rhythm of: Sinus  Reevaluation: After the interventions noted above, I reevaluated the patient and found that they have :improved  Social Determinants of Health:  lives  independently  Disposition: Discharge  Co morbidities that complicate the patient evaluation  Past Medical History:  Diagnosis Date   Abnormal Pap smear of cervix    2011?   Allergy    Anemia    Anxiety    Asthma    hospitalization 2008, last attack this month   Chronic nausea    Depression    Dysmenorrhea    Dyspareunia    Gestational diabetes 07/13/2016   no meds   Hidradenitis suppurativa of left axilla 02/21/2021   Hypertension    Migraines    Miscarriage 03/03/2022   Mixed hyperlipidemia 02/23/2021   PID (pelvic inflammatory disease)    S/P primary low transverse C-section 10/26/2016   Wears glasses      Medicines Meds ordered this encounter  Medications   ipratropium-albuterol  (DUONEB) 0.5-2.5 (3) MG/3ML nebulizer solution 3 mL   dexamethasone  (DECADRON ) injection 10 mg   albuterol  (PROVENTIL ) (2.5 MG/3ML) 0.083% nebulizer solution 10 mg   ipratropium (ATROVENT ) nebulizer solution 0.5 mg   albuterol  (VENTOLIN  HFA) 108 (90 Base) MCG/ACT inhaler 2 puff    I have reviewed the patients home medicines and have made adjustments as needed  Problem List / ED Course: Problem List Items Addressed This Visit   None Visit Diagnoses       Moderate persistent asthma with exacerbation    -  Primary   Relevant Medications   ipratropium-albuterol  (DUONEB) 0.5-2.5 (3) MG/3ML nebulizer solution 3 mL (Completed)   dexamethasone  (DECADRON ) injection 10 mg (Completed)   albuterol  (PROVENTIL ) (2.5 MG/3ML) 0.083% nebulizer solution 10 mg (Completed)   ipratropium (ATROVENT ) nebulizer solution 0.5 mg (Completed)   albuterol  (VENTOLIN  HFA) 108 (90 Base) MCG/ACT inhaler 2 puff (Completed)                   Final Clinical Impression(s) / ED Diagnoses Final diagnoses:  Moderate persistent asthma with exacerbation    Rx / DC Orders ED Discharge Orders     None         Jadore Mcguffin, Charmaine FALCON, MD 10/28/23 [phone]

## 2023-12-22 ENCOUNTER — Telehealth: Payer: Self-pay | Admitting: Family Medicine

## 2023-12-22 NOTE — Telephone Encounter (Signed)
 [employer] requested medical records for this patient. Records were sent at 11:52 am today.

## 2024-05-03 ENCOUNTER — Encounter (HOSPITAL_COMMUNITY): Payer: Self-pay

## 2024-05-03 ENCOUNTER — Other Ambulatory Visit: Payer: Self-pay

## 2024-05-03 ENCOUNTER — Emergency Department (HOSPITAL_COMMUNITY)
Admission: EM | Admit: 2024-05-03 | Discharge: 2024-05-03 | Disposition: A | Discharge status: Home / Self Care | Attending: Emergency Medicine | Admitting: Emergency Medicine

## 2024-05-03 DIAGNOSIS — J45909 Unspecified asthma, uncomplicated: Secondary | ICD-10-CM | POA: Diagnosis not present

## 2024-05-03 DIAGNOSIS — J069 Acute upper respiratory infection, unspecified: Secondary | ICD-10-CM

## 2024-05-03 DIAGNOSIS — R059 Cough, unspecified: Secondary | ICD-10-CM | POA: Diagnosis present

## 2024-05-03 DIAGNOSIS — I10 Essential (primary) hypertension: Secondary | ICD-10-CM

## 2024-05-03 LAB — GROUP A STREP BY PCR: Group A Strep by PCR: NOT DETECTED

## 2024-05-03 NOTE — ED Triage Notes (Signed)
 Pt presents to ED from home C/O sore throat, difficulty swallowing X 4 days.

## 2024-05-03 NOTE — ED Provider Notes (Signed)
 [place] EMERGENCY DEPARTMENT AT [facility] Provider Note   CSN: [id number] Arrival date & time: 05/03/24  9295     Patient presents with: Sore Throat   Rock Glisson Harlacher is a 38 y.o. female who presents to the ED today with a 3-day history of sore throat, postnasal drip, and rhinorrhea.  Has persistently worsened over the last several days, she also appreciates having a cough that is nonproductive over the last 3 days.  Further states that she has increased headache, endorses having persistent headache however has been worsened over the last 3 days.  No visual acuity changes, denies having any chest pain, over the last 3 days has had increasing shortness of breath with prolonged exertion.  Her previous medical history shows diagnosis of mild intermittent asthma, prediabetes, and mixed hyperlipidemia.    Sore Throat       Prior to Admission medications   Medication Sig Start Date End Date Taking? Authorizing Provider  albuterol  (PROVENTIL ) (2.5 MG/3ML) 0.083% nebulizer solution Take 3 mLs (2.5 mg total) by nebulization every 6 (six) hours as needed for wheezing or shortness of breath. 03/18/23   Fondaw, Hamp RAMAN, PA  cyclobenzaprine  (FLEXERIL ) 10 MG tablet Take 1 tablet (10 mg total) by mouth 2 (two) times daily as needed for muscle spasms. Patient not taking: Reported on 01/08/2023 05/15/22   Nivia Colon, PA-C  ergocalciferol (VITAMIN D2) 1.25 MG (50000 UT) capsule Take 50,000 Units by mouth once a week.    [provider]  ibuprofen  (ADVIL ) 600 MG tablet Take 1 tablet (600 mg total) by mouth every 6 (six) hours as needed. Patient not taking: Reported on 01/08/2023 05/15/22   Tran, Bowie, PA-C  Multiple Vitamin (MULTIVITAMINS PO) Take 1 tablet by mouth daily.    [provider]  olmesartan -hydrochlorothiazide (BENICAR  HCT) 20-12.5 MG tablet Take 1 tablet by mouth daily. 02/04/23   Doretha Folks, MD  ondansetron  (ZOFRAN -ODT) 8 MG disintegrating tablet Take  1 tablet (8 mg total) by mouth every 8 (eight) hours as needed for nausea or vomiting. 03/05/23   Randol Simmonds, MD  predniSONE  (STERAPRED UNI-PAK 21 TAB) 10 MG (21) TBPK tablet Take by mouth daily. Take 6 tabs by mouth daily  for 2 days, then 5 tabs for 2 days, then 4 tabs for 2 days, then 3 tabs for 2 days, 2 tabs for 2 days, then 1 tab by mouth daily for 2 days 03/18/23   Neldon Hamp RAMAN, PA  pantoprazole  (PROTONIX ) 40 MG tablet Take 1 tablet (40 mg total) by mouth daily for 14 days. Patient not taking: Reported on 01/28/2020 12/31/18 07/10/20  Windle Almarie ORN, PA-C  sucralfate  (CARAFATE ) 1 g tablet Take 1 tablet (1 g total) by mouth 4 (four) times daily -  with meals and at bedtime for 14 days. Patient not taking: Reported on 01/28/2020 12/31/18 07/10/20  Windle Almarie ORN, PA-C    Allergies: Patient has no known allergies.    Review of Systems  HENT:  Positive for congestion, postnasal drip, rhinorrhea and sore throat.   All other systems reviewed and are negative.   Updated Vital Signs BP (!) 159/117   Pulse 91   Temp 98.8 F (37.1 C) (Oral)   Resp 17   Ht 5' 2 (1.575 m)   Wt 89.8 kg   SpO2 100%   BMI 36.21 kg/m   Physical Exam Vitals and nursing note reviewed.  Constitutional:      General: She is not in acute distress.  Appearance: Normal appearance.  HENT:     Head: Normocephalic and atraumatic.     Right Ear: Tympanic membrane and ear canal normal.     Left Ear: Tympanic membrane and ear canal normal.     Nose: Mucosal edema and congestion present.     Right Turbinates: Swollen.     Left Turbinates: Swollen.     Mouth/Throat:     Mouth: Mucous membranes are moist.     Pharynx: Oropharynx is clear.  Eyes:     Extraocular Movements: Extraocular movements intact.     Conjunctiva/sclera: Conjunctivae normal.     Pupils: Pupils are equal, round, and reactive to light.  Cardiovascular:     Rate and Rhythm: Normal rate and regular rhythm.     Pulses: Normal pulses.      Heart sounds: Normal heart sounds. No murmur heard.    No friction rub. No gallop.  Pulmonary:     Effort: Pulmonary effort is normal.     Breath sounds: Normal breath sounds.  Abdominal:     General: Abdomen is flat. Bowel sounds are normal.     Palpations: Abdomen is soft.  Musculoskeletal:        General: Normal range of motion.     Cervical back: Normal range of motion and neck supple.     Right lower leg: No edema.     Left lower leg: No edema.  Skin:    General: Skin is warm and dry.     Capillary Refill: Capillary refill takes less than 2 seconds.  Neurological:     General: No focal deficit present.     Mental Status: She is alert. Mental status is at baseline.  Psychiatric:        Mood and Affect: Mood normal.     (all labs ordered are listed, but only abnormal results are displayed) Labs Reviewed  GROUP A STREP BY PCR    EKG: None  Radiology: No results found.   Procedures   Medications Ordered in the ED - No data to display                                  Medical Decision Making  Medical Decision Making:   Elyanah Farino Gubler is a 38 y.o. female who presented to the ED today with sore throat and nasal congestion detailed above.     Complete initial physical exam performed, notably the patient  was alert and oriented no apparent distress.  Notable exam findings of nasal mucosal edema and postnasal drip.    Reviewed and confirmed nursing documentation for past medical history, family history, social history.    Initial Assessment:   With the patient's presentation of sore throat and nasal congestion, most likely diagnosis is viral URI. Other diagnoses were considered including (but not limited to) streptococcal pharyngitis. These are considered less likely due to history of present illness and physical exam findings.     Initial Plan:  Obtain strep swab to evaluate for presence of streptococcal pharyngitis Objective evaluation as below  reviewed   Initial Study Results:   Laboratory  All laboratory results reviewed without evidence of clinically relevant pathology.   Exceptions include: None  Reassessment and Plan:   Strep swab for this patient is negative, and the posterior oropharynx is mildly erythematous but without any notable tonsillar edema or exudates.  As such doubt this patient has streptococcal pharyngitis and is  likely having sore throat and postnasal drip secondary to a viral URI.  Discussed with patient managing symptomatically with over-the-counter remedies, increased her fluid intake, and follow-up with primary care as needed.  She has noticed patient was hypertensive while in the emergency department.  She has no acute chest pain, shortness of breath, or headache associated with this.  As such, defer management of this to primary care.  Will provide her with means to monitor her blood pressure twice daily and directed her to follow-up with primary care regarding definitive management of her hypertension.  Discussed dietary changes, and other at home measures she can do prior to her appointment with primary care in regards to this.       Final diagnoses:  Viral upper respiratory tract infection  Primary hypertension    ED Discharge Orders     None          Myriam Dorn BROCKS, PA 05/03/24 9145    Randol Simmonds, MD 05/04/24 [phone]

## 2024-08-30 ENCOUNTER — Other Ambulatory Visit: Payer: Self-pay

## 2024-08-30 ENCOUNTER — Emergency Department (HOSPITAL_COMMUNITY)
Admission: EM | Admit: 2024-08-30 | Discharge: 2024-08-30 | Disposition: A | Discharge status: Home / Self Care | Payer: Self-pay | Attending: Emergency Medicine | Admitting: Emergency Medicine

## 2024-08-30 DIAGNOSIS — Z7952 Long term (current) use of systemic steroids: Secondary | ICD-10-CM | POA: Insufficient documentation

## 2024-08-30 DIAGNOSIS — J45909 Unspecified asthma, uncomplicated: Secondary | ICD-10-CM | POA: Insufficient documentation

## 2024-08-30 DIAGNOSIS — L0201 Cutaneous abscess of face: Secondary | ICD-10-CM | POA: Insufficient documentation

## 2024-08-30 MED ORDER — LIDOCAINE-EPINEPHRINE (PF) 2 %-1:200000 IJ SOLN
10.0000 mL | Freq: Once | INTRAMUSCULAR | Status: AC
  Administered 2024-08-30: 10 mL via INTRADERMAL
  Filled 2024-08-30: qty 20

## 2024-08-30 NOTE — ED Provider Notes (Signed)
 [place] EMERGENCY DEPARTMENT AT [facility] Provider Note   CSN: [id number] Arrival date & time: 08/30/24  2043     Patient presents with: Facial Swelling   Vanessa Foster is a 38 y.o. female.   The history is provided by the patient and medical records. No language interpreter was used.     38 year old female with history of asthma, allergies, anxiety presenting with complaints of facial swelling.  Patient report progressive worsening pain and swelling involving the right side of her face near her ear ongoing for the past 5 days.  It is tender to the touch and mildly itchy.  She denies any injury no fever no headache and no trauma.  She is up-to-date with tetanus.  She is currently on her menstruation.  She is up-to-date with tetanus.  She is currently on her menstruation.  Prior to Admission medications   Medication Sig Start Date End Date Taking? Authorizing Provider  albuterol  (PROVENTIL ) (2.5 MG/3ML) 0.083% nebulizer solution Take 3 mLs (2.5 mg total) by nebulization every 6 (six) hours as needed for wheezing or shortness of breath. 03/18/23   Fondaw, Hamp RAMAN, PA  cyclobenzaprine  (FLEXERIL ) 10 MG tablet Take 1 tablet (10 mg total) by mouth 2 (two) times daily as needed for muscle spasms. Patient not taking: Reported on 01/08/2023 05/15/22   Nivia Colon, PA-C  ergocalciferol (VITAMIN D2) 1.25 MG (50000 UT) capsule Take 50,000 Units by mouth once a week.    [provider]  ibuprofen  (ADVIL ) 600 MG tablet Take 1 tablet (600 mg total) by mouth every 6 (six) hours as needed. Patient not taking: Reported on 01/08/2023 05/15/22   Nuno Brubacher, PA-C  Multiple Vitamin (MULTIVITAMINS PO) Take 1 tablet by mouth daily.    [provider]  olmesartan -hydrochlorothiazide (BENICAR  HCT) 20-12.5 MG tablet Take 1 tablet by mouth daily. 02/04/23   Doretha Folks, MD  ondansetron  (ZOFRAN -ODT) 8 MG disintegrating tablet Take 1 tablet (8 mg total) by mouth every 8 (eight)  hours as needed for nausea or vomiting. 03/05/23   Randol Simmonds, MD  predniSONE  (STERAPRED UNI-PAK 21 TAB) 10 MG (21) TBPK tablet Take by mouth daily. Take 6 tabs by mouth daily  for 2 days, then 5 tabs for 2 days, then 4 tabs for 2 days, then 3 tabs for 2 days, 2 tabs for 2 days, then 1 tab by mouth daily for 2 days 03/18/23   Neldon Hamp RAMAN, PA  pantoprazole  (PROTONIX ) 40 MG tablet Take 1 tablet (40 mg total) by mouth daily for 14 days. Patient not taking: Reported on 01/28/2020 12/31/18 07/10/20  Windle Almarie ORN, PA-C  sucralfate  (CARAFATE ) 1 g tablet Take 1 tablet (1 g total) by mouth 4 (four) times daily -  with meals and at bedtime for 14 days. Patient not taking: Reported on 01/28/2020 12/31/18 07/10/20  Windle Almarie ORN, PA-C    Allergies: Patient has no known allergies.    Review of Systems  Constitutional:  Negative for fever.    Updated Vital Signs BP (!) 167/127   Pulse 69   Temp 98.5 F (36.9 C) (Oral)   Resp 18   Ht 5' 2 (1.575 m)   Wt 79.8 kg   SpO2 100%   BMI 32.19 kg/m   Physical Exam Vitals and nursing note reviewed.  Constitutional:      General: She is not in acute distress.    Appearance: She is well-developed.  HENT:     Head: Atraumatic.  Comments: area of induration and fluctuant noted to the right side of face anterior to the right ear and is approximately 3 cm in diameter.  Area is tender to palpation with some surrounding erythema. Eyes:     Conjunctiva/sclera: Conjunctivae normal.  Pulmonary:     Effort: Pulmonary effort is normal.  Musculoskeletal:     Cervical back: Neck supple.  Skin:    Findings: No rash.  Neurological:     Mental Status: She is alert.  Psychiatric:        Mood and Affect: Mood normal.     (all labs ordered are listed, but only abnormal results are displayed) Labs Reviewed  PREGNANCY, URINE    EKG: None  Radiology: No results found.   .Incision and Drainage  Date/Time: 08/30/2024 11:39 PM  Performed  by: Nivia Colon, PA-C Authorized by: Nivia Colon, PA-C   Consent:    Consent obtained:  Verbal   Consent given by:  Patient   Risks discussed:  Bleeding, incomplete drainage, pain and damage to other organs   Alternatives discussed:  No treatment Universal protocol:    Procedure explained and questions answered to patient or proxy's satisfaction: yes     Relevant documents present and verified: yes     Test results available : yes     Imaging studies available: yes     Required blood products, implants, devices, and special equipment available: yes     Site/side marked: yes     Immediately prior to procedure, a time out was called: yes     Patient identity confirmed:  Verbally with patient Location:    Type:  Abscess   Size:  3   Location:  Head   Head location:  Face Pre-procedure details:    Skin preparation:  Betadine Sedation:    Sedation type:  None Anesthesia:    Anesthesia method:  Local infiltration   Local anesthetic:  Lidocaine  1% WITH epi Procedure type:    Complexity:  Simple Procedure details:    Ultrasound guidance: no     Needle aspiration: no     Incision types:  Single straight   Incision depth:  Subcutaneous   Wound management:  Probed and deloculated, irrigated with saline and extensive cleaning   Drainage:  Purulent   Drainage amount:  Moderate   Packing materials:  None Post-procedure details:    Procedure completion:  Tolerated well, no immediate complications    Medications Ordered in the ED  lidocaine -EPINEPHrine  (XYLOCAINE  W/EPI) 2 %-1:200000 (PF) injection 10 mL (10 mLs Intradermal Given by Other 08/30/24 2318)                                    Medical Decision Making Amount and/or Complexity of Data Reviewed Labs: ordered.  Risk Prescription drug management.   BP (!) 167/127   Pulse 69   Temp 98.5 F (36.9 C) (Oral)   Resp 18   Ht 5' 2 (1.575 m)   Wt 79.8 kg   SpO2 100%   BMI 32.19 kg/m   52:71 PM  38 year old female  with history of asthma, allergies, anxiety presenting with complaints of facial swelling.  Patient report progressive worsening pain and swelling involving the right side of her face near her ear ongoing for the past 5 days.  It is tender to the touch and mildly itchy.  She denies any injury no fever no headache and no  trauma.  She is up-to-date with tetanus.  She is currently on her menstruation.  She is up-to-date with tetanus.  She is currently on her menstruation.  Exam notable for an area of induration and fluctuance to the right side of face near the zygomatic arch adjacent to the ear without any auricular involvement.  Finding consistent with a subcutaneous abscess amenable for incision and drainage.  Successful incision and drainage of facial abscess.  Recommend warm compress at home and patient to continue to take antibiotic that was recently prescribed.  Patient stable for discharge.     Final diagnoses:  Cutaneous abscess of face    ED Discharge Orders     None          Nivia Colon, PA-C 08/30/24 2346    Patsey Lot, MD 08/31/24 1444

## 2024-08-30 NOTE — ED Triage Notes (Signed)
 Patient is also taking ABT for boil under arm

## 2024-08-30 NOTE — ED Triage Notes (Signed)
 Patient brought in by POV with c/o swelling to the right side of face near her ear. Patient states symptoms started on Saturday and today it has swelling even larger and now painful.

## 2024-08-30 NOTE — Discharge Instructions (Addendum)
 Please apply warm moist compress to the affected area several times daily to aid with healing.  You may take over-the-counter Tylenol  or ibuprofen  as needed for pain

## 2024-08-31 LAB — PREGNANCY, URINE: Preg Test, Ur: NEGATIVE
